# Patient Record
Sex: Male | Born: 1945
Health system: Southern US, Community
[De-identification: ages and names within clinical notes are randomized; demographics above are authoritative.]

## PROBLEM LIST (undated history)

## (undated) DIAGNOSIS — M1990 Unspecified osteoarthritis, unspecified site: Secondary | ICD-10-CM

## (undated) DIAGNOSIS — I2510 Atherosclerotic heart disease of native coronary artery without angina pectoris: Secondary | ICD-10-CM

## (undated) DIAGNOSIS — E119 Type 2 diabetes mellitus without complications: Secondary | ICD-10-CM

## (undated) DIAGNOSIS — I447 Left bundle-branch block, unspecified: Secondary | ICD-10-CM

## (undated) DIAGNOSIS — I10 Essential (primary) hypertension: Secondary | ICD-10-CM

## (undated) DIAGNOSIS — J45909 Unspecified asthma, uncomplicated: Secondary | ICD-10-CM

## (undated) DIAGNOSIS — I509 Heart failure, unspecified: Secondary | ICD-10-CM

## (undated) DIAGNOSIS — I4819 Other persistent atrial fibrillation: Secondary | ICD-10-CM

## (undated) DIAGNOSIS — Z95810 Presence of automatic (implantable) cardiac defibrillator: Secondary | ICD-10-CM

## (undated) DIAGNOSIS — I255 Ischemic cardiomyopathy: Secondary | ICD-10-CM

## (undated) HISTORY — DX: Ischemic cardiomyopathy: I25.5

## (undated) HISTORY — DX: Left bundle-branch block, unspecified: I44.7

---

## 2007-09-04 ENCOUNTER — Ambulatory Visit: Payer: Self-pay | Admitting: Cardiology

## 2013-06-10 DIAGNOSIS — I10 Essential (primary) hypertension: Secondary | ICD-10-CM | POA: Diagnosis not present

## 2013-06-19 DIAGNOSIS — I10 Essential (primary) hypertension: Secondary | ICD-10-CM | POA: Diagnosis not present

## 2013-06-19 DIAGNOSIS — E1065 Type 1 diabetes mellitus with hyperglycemia: Secondary | ICD-10-CM | POA: Diagnosis not present

## 2013-09-10 DIAGNOSIS — J209 Acute bronchitis, unspecified: Secondary | ICD-10-CM | POA: Diagnosis not present

## 2013-09-10 DIAGNOSIS — I10 Essential (primary) hypertension: Secondary | ICD-10-CM | POA: Diagnosis not present

## 2013-12-10 DIAGNOSIS — I10 Essential (primary) hypertension: Secondary | ICD-10-CM | POA: Diagnosis not present

## 2013-12-12 DIAGNOSIS — I10 Essential (primary) hypertension: Secondary | ICD-10-CM | POA: Diagnosis not present

## 2013-12-12 DIAGNOSIS — E1065 Type 1 diabetes mellitus with hyperglycemia: Secondary | ICD-10-CM | POA: Diagnosis not present

## 2013-12-12 DIAGNOSIS — IMO0002 Reserved for concepts with insufficient information to code with codable children: Secondary | ICD-10-CM | POA: Diagnosis not present

## 2013-12-12 DIAGNOSIS — N400 Enlarged prostate without lower urinary tract symptoms: Secondary | ICD-10-CM | POA: Diagnosis not present

## 2014-03-14 DIAGNOSIS — IMO0001 Reserved for inherently not codable concepts without codable children: Secondary | ICD-10-CM | POA: Diagnosis not present

## 2014-03-14 DIAGNOSIS — E1065 Type 1 diabetes mellitus with hyperglycemia: Secondary | ICD-10-CM | POA: Diagnosis not present

## 2014-03-14 DIAGNOSIS — IMO0002 Reserved for concepts with insufficient information to code with codable children: Secondary | ICD-10-CM | POA: Diagnosis not present

## 2014-03-14 DIAGNOSIS — I10 Essential (primary) hypertension: Secondary | ICD-10-CM | POA: Diagnosis not present

## 2014-04-10 DIAGNOSIS — E119 Type 2 diabetes mellitus without complications: Secondary | ICD-10-CM | POA: Diagnosis not present

## 2014-05-31 DIAGNOSIS — Z23 Encounter for immunization: Secondary | ICD-10-CM | POA: Diagnosis not present

## 2014-06-16 DIAGNOSIS — E1165 Type 2 diabetes mellitus with hyperglycemia: Secondary | ICD-10-CM | POA: Diagnosis not present

## 2014-06-16 DIAGNOSIS — I10 Essential (primary) hypertension: Secondary | ICD-10-CM | POA: Diagnosis not present

## 2014-06-20 DIAGNOSIS — R079 Chest pain, unspecified: Secondary | ICD-10-CM | POA: Diagnosis not present

## 2014-06-20 DIAGNOSIS — I447 Left bundle-branch block, unspecified: Secondary | ICD-10-CM | POA: Diagnosis not present

## 2014-07-10 DIAGNOSIS — R079 Chest pain, unspecified: Secondary | ICD-10-CM | POA: Diagnosis not present

## 2014-09-16 DIAGNOSIS — E1165 Type 2 diabetes mellitus with hyperglycemia: Secondary | ICD-10-CM | POA: Diagnosis not present

## 2014-09-16 DIAGNOSIS — I10 Essential (primary) hypertension: Secondary | ICD-10-CM | POA: Diagnosis not present

## 2014-09-24 DIAGNOSIS — I10 Essential (primary) hypertension: Secondary | ICD-10-CM | POA: Diagnosis not present

## 2014-09-24 DIAGNOSIS — E1165 Type 2 diabetes mellitus with hyperglycemia: Secondary | ICD-10-CM | POA: Diagnosis not present

## 2014-12-16 DIAGNOSIS — I10 Essential (primary) hypertension: Secondary | ICD-10-CM | POA: Diagnosis not present

## 2014-12-16 DIAGNOSIS — M1712 Unilateral primary osteoarthritis, left knee: Secondary | ICD-10-CM | POA: Diagnosis not present

## 2014-12-16 DIAGNOSIS — E1165 Type 2 diabetes mellitus with hyperglycemia: Secondary | ICD-10-CM | POA: Diagnosis not present

## 2015-03-17 DIAGNOSIS — M1712 Unilateral primary osteoarthritis, left knee: Secondary | ICD-10-CM | POA: Diagnosis not present

## 2015-03-17 DIAGNOSIS — I10 Essential (primary) hypertension: Secondary | ICD-10-CM | POA: Diagnosis not present

## 2015-03-17 DIAGNOSIS — C4441 Basal cell carcinoma of skin of scalp and neck: Secondary | ICD-10-CM | POA: Diagnosis not present

## 2015-03-17 DIAGNOSIS — E1165 Type 2 diabetes mellitus with hyperglycemia: Secondary | ICD-10-CM | POA: Diagnosis not present

## 2015-03-17 DIAGNOSIS — L570 Actinic keratosis: Secondary | ICD-10-CM | POA: Diagnosis not present

## 2015-03-18 DIAGNOSIS — I10 Essential (primary) hypertension: Secondary | ICD-10-CM | POA: Diagnosis not present

## 2015-03-18 DIAGNOSIS — E1165 Type 2 diabetes mellitus with hyperglycemia: Secondary | ICD-10-CM | POA: Diagnosis not present

## 2015-04-02 DIAGNOSIS — C4441 Basal cell carcinoma of skin of scalp and neck: Secondary | ICD-10-CM | POA: Diagnosis not present

## 2015-04-19 DIAGNOSIS — Z23 Encounter for immunization: Secondary | ICD-10-CM | POA: Diagnosis not present

## 2015-05-25 DIAGNOSIS — Z23 Encounter for immunization: Secondary | ICD-10-CM | POA: Diagnosis not present

## 2015-06-18 DIAGNOSIS — E1165 Type 2 diabetes mellitus with hyperglycemia: Secondary | ICD-10-CM | POA: Diagnosis not present

## 2015-06-18 DIAGNOSIS — I10 Essential (primary) hypertension: Secondary | ICD-10-CM | POA: Diagnosis not present

## 2015-09-10 DIAGNOSIS — E113293 Type 2 diabetes mellitus with mild nonproliferative diabetic retinopathy without macular edema, bilateral: Secondary | ICD-10-CM | POA: Diagnosis not present

## 2015-09-18 DIAGNOSIS — I10 Essential (primary) hypertension: Secondary | ICD-10-CM | POA: Diagnosis not present

## 2015-09-18 DIAGNOSIS — E1165 Type 2 diabetes mellitus with hyperglycemia: Secondary | ICD-10-CM | POA: Diagnosis not present

## 2015-09-18 DIAGNOSIS — Z0000 Encounter for general adult medical examination without abnormal findings: Secondary | ICD-10-CM | POA: Diagnosis not present

## 2015-09-23 DIAGNOSIS — I10 Essential (primary) hypertension: Secondary | ICD-10-CM | POA: Diagnosis not present

## 2015-09-23 DIAGNOSIS — E1165 Type 2 diabetes mellitus with hyperglycemia: Secondary | ICD-10-CM | POA: Diagnosis not present

## 2015-12-18 DIAGNOSIS — L723 Sebaceous cyst: Secondary | ICD-10-CM | POA: Diagnosis not present

## 2015-12-18 DIAGNOSIS — E784 Other hyperlipidemia: Secondary | ICD-10-CM | POA: Diagnosis not present

## 2015-12-18 DIAGNOSIS — I10 Essential (primary) hypertension: Secondary | ICD-10-CM | POA: Diagnosis not present

## 2015-12-18 DIAGNOSIS — E1165 Type 2 diabetes mellitus with hyperglycemia: Secondary | ICD-10-CM | POA: Diagnosis not present

## 2016-01-15 DIAGNOSIS — E784 Other hyperlipidemia: Secondary | ICD-10-CM | POA: Diagnosis not present

## 2016-01-15 DIAGNOSIS — I10 Essential (primary) hypertension: Secondary | ICD-10-CM | POA: Diagnosis not present

## 2016-01-15 DIAGNOSIS — E1165 Type 2 diabetes mellitus with hyperglycemia: Secondary | ICD-10-CM | POA: Diagnosis not present

## 2016-01-19 DIAGNOSIS — Z951 Presence of aortocoronary bypass graft: Secondary | ICD-10-CM | POA: Diagnosis not present

## 2016-01-19 DIAGNOSIS — I519 Heart disease, unspecified: Secondary | ICD-10-CM | POA: Diagnosis not present

## 2016-01-19 DIAGNOSIS — Z79899 Other long term (current) drug therapy: Secondary | ICD-10-CM | POA: Diagnosis not present

## 2016-01-19 DIAGNOSIS — E119 Type 2 diabetes mellitus without complications: Secondary | ICD-10-CM | POA: Diagnosis not present

## 2016-01-19 DIAGNOSIS — I10 Essential (primary) hypertension: Secondary | ICD-10-CM | POA: Diagnosis not present

## 2016-01-19 DIAGNOSIS — Z8249 Family history of ischemic heart disease and other diseases of the circulatory system: Secondary | ICD-10-CM | POA: Diagnosis not present

## 2016-01-19 DIAGNOSIS — Z7982 Long term (current) use of aspirin: Secondary | ICD-10-CM | POA: Diagnosis not present

## 2016-01-19 DIAGNOSIS — Z1211 Encounter for screening for malignant neoplasm of colon: Secondary | ICD-10-CM | POA: Diagnosis not present

## 2016-01-19 DIAGNOSIS — Z7984 Long term (current) use of oral hypoglycemic drugs: Secondary | ICD-10-CM | POA: Diagnosis not present

## 2016-01-19 DIAGNOSIS — Z809 Family history of malignant neoplasm, unspecified: Secondary | ICD-10-CM | POA: Diagnosis not present

## 2016-03-09 DIAGNOSIS — E119 Type 2 diabetes mellitus without complications: Secondary | ICD-10-CM | POA: Diagnosis not present

## 2016-03-18 DIAGNOSIS — E1165 Type 2 diabetes mellitus with hyperglycemia: Secondary | ICD-10-CM | POA: Diagnosis not present

## 2016-03-18 DIAGNOSIS — I10 Essential (primary) hypertension: Secondary | ICD-10-CM | POA: Diagnosis not present

## 2016-03-18 DIAGNOSIS — E784 Other hyperlipidemia: Secondary | ICD-10-CM | POA: Diagnosis not present

## 2016-03-23 DIAGNOSIS — E784 Other hyperlipidemia: Secondary | ICD-10-CM | POA: Diagnosis not present

## 2016-03-23 DIAGNOSIS — E1165 Type 2 diabetes mellitus with hyperglycemia: Secondary | ICD-10-CM | POA: Diagnosis not present

## 2016-03-23 DIAGNOSIS — Z125 Encounter for screening for malignant neoplasm of prostate: Secondary | ICD-10-CM | POA: Diagnosis not present

## 2016-03-23 DIAGNOSIS — I10 Essential (primary) hypertension: Secondary | ICD-10-CM | POA: Diagnosis not present

## 2016-04-06 DIAGNOSIS — Z23 Encounter for immunization: Secondary | ICD-10-CM | POA: Diagnosis not present

## 2016-05-24 DIAGNOSIS — Z23 Encounter for immunization: Secondary | ICD-10-CM | POA: Diagnosis not present

## 2016-07-01 DIAGNOSIS — E784 Other hyperlipidemia: Secondary | ICD-10-CM | POA: Diagnosis not present

## 2016-07-01 DIAGNOSIS — I10 Essential (primary) hypertension: Secondary | ICD-10-CM | POA: Diagnosis not present

## 2016-07-01 DIAGNOSIS — E1165 Type 2 diabetes mellitus with hyperglycemia: Secondary | ICD-10-CM | POA: Diagnosis not present

## 2016-10-06 DIAGNOSIS — Z1389 Encounter for screening for other disorder: Secondary | ICD-10-CM | POA: Diagnosis not present

## 2016-10-06 DIAGNOSIS — E1165 Type 2 diabetes mellitus with hyperglycemia: Secondary | ICD-10-CM | POA: Diagnosis not present

## 2016-10-06 DIAGNOSIS — Z0000 Encounter for general adult medical examination without abnormal findings: Secondary | ICD-10-CM | POA: Diagnosis not present

## 2016-10-06 DIAGNOSIS — E784 Other hyperlipidemia: Secondary | ICD-10-CM | POA: Diagnosis not present

## 2016-10-06 DIAGNOSIS — I10 Essential (primary) hypertension: Secondary | ICD-10-CM | POA: Diagnosis not present

## 2016-11-28 DIAGNOSIS — E11319 Type 2 diabetes mellitus with unspecified diabetic retinopathy without macular edema: Secondary | ICD-10-CM | POA: Diagnosis not present

## 2017-01-09 DIAGNOSIS — I10 Essential (primary) hypertension: Secondary | ICD-10-CM | POA: Diagnosis not present

## 2017-01-09 DIAGNOSIS — B358 Other dermatophytoses: Secondary | ICD-10-CM | POA: Diagnosis not present

## 2017-01-09 DIAGNOSIS — E784 Other hyperlipidemia: Secondary | ICD-10-CM | POA: Diagnosis not present

## 2017-01-09 DIAGNOSIS — E1165 Type 2 diabetes mellitus with hyperglycemia: Secondary | ICD-10-CM | POA: Diagnosis not present

## 2017-01-23 DIAGNOSIS — L2089 Other atopic dermatitis: Secondary | ICD-10-CM | POA: Diagnosis not present

## 2017-02-15 DIAGNOSIS — L258 Unspecified contact dermatitis due to other agents: Secondary | ICD-10-CM | POA: Diagnosis not present

## 2017-04-17 DIAGNOSIS — E784 Other hyperlipidemia: Secondary | ICD-10-CM | POA: Diagnosis not present

## 2017-04-17 DIAGNOSIS — E1165 Type 2 diabetes mellitus with hyperglycemia: Secondary | ICD-10-CM | POA: Diagnosis not present

## 2017-04-17 DIAGNOSIS — E119 Type 2 diabetes mellitus without complications: Secondary | ICD-10-CM | POA: Diagnosis not present

## 2017-04-17 DIAGNOSIS — I10 Essential (primary) hypertension: Secondary | ICD-10-CM | POA: Diagnosis not present

## 2017-04-24 DIAGNOSIS — Z23 Encounter for immunization: Secondary | ICD-10-CM | POA: Diagnosis not present

## 2017-05-31 DIAGNOSIS — L308 Other specified dermatitis: Secondary | ICD-10-CM | POA: Diagnosis not present

## 2017-07-17 DIAGNOSIS — E119 Type 2 diabetes mellitus without complications: Secondary | ICD-10-CM | POA: Diagnosis not present

## 2017-07-17 DIAGNOSIS — I10 Essential (primary) hypertension: Secondary | ICD-10-CM | POA: Diagnosis not present

## 2017-10-17 DIAGNOSIS — E7849 Other hyperlipidemia: Secondary | ICD-10-CM | POA: Diagnosis not present

## 2017-10-17 DIAGNOSIS — E119 Type 2 diabetes mellitus without complications: Secondary | ICD-10-CM | POA: Diagnosis not present

## 2017-10-17 DIAGNOSIS — I10 Essential (primary) hypertension: Secondary | ICD-10-CM | POA: Diagnosis not present

## 2017-10-17 DIAGNOSIS — M1712 Unilateral primary osteoarthritis, left knee: Secondary | ICD-10-CM | POA: Diagnosis not present

## 2018-01-23 DIAGNOSIS — E7849 Other hyperlipidemia: Secondary | ICD-10-CM | POA: Diagnosis not present

## 2018-01-23 DIAGNOSIS — Z0000 Encounter for general adult medical examination without abnormal findings: Secondary | ICD-10-CM | POA: Diagnosis not present

## 2018-01-23 DIAGNOSIS — E119 Type 2 diabetes mellitus without complications: Secondary | ICD-10-CM | POA: Diagnosis not present

## 2018-01-23 DIAGNOSIS — Z1389 Encounter for screening for other disorder: Secondary | ICD-10-CM | POA: Diagnosis not present

## 2018-01-23 DIAGNOSIS — M1712 Unilateral primary osteoarthritis, left knee: Secondary | ICD-10-CM | POA: Diagnosis not present

## 2018-01-23 DIAGNOSIS — I10 Essential (primary) hypertension: Secondary | ICD-10-CM | POA: Diagnosis not present

## 2018-01-30 DIAGNOSIS — M810 Age-related osteoporosis without current pathological fracture: Secondary | ICD-10-CM | POA: Diagnosis not present

## 2018-01-30 DIAGNOSIS — E119 Type 2 diabetes mellitus without complications: Secondary | ICD-10-CM | POA: Diagnosis not present

## 2018-02-19 DIAGNOSIS — Z136 Encounter for screening for cardiovascular disorders: Secondary | ICD-10-CM | POA: Diagnosis not present

## 2018-02-19 DIAGNOSIS — Z87891 Personal history of nicotine dependence: Secondary | ICD-10-CM | POA: Diagnosis not present

## 2018-04-21 DIAGNOSIS — Z23 Encounter for immunization: Secondary | ICD-10-CM | POA: Diagnosis not present

## 2018-05-07 DIAGNOSIS — E7849 Other hyperlipidemia: Secondary | ICD-10-CM | POA: Diagnosis not present

## 2018-05-07 DIAGNOSIS — E119 Type 2 diabetes mellitus without complications: Secondary | ICD-10-CM | POA: Diagnosis not present

## 2018-05-07 DIAGNOSIS — Z1389 Encounter for screening for other disorder: Secondary | ICD-10-CM | POA: Diagnosis not present

## 2018-05-07 DIAGNOSIS — M1712 Unilateral primary osteoarthritis, left knee: Secondary | ICD-10-CM | POA: Diagnosis not present

## 2018-05-07 DIAGNOSIS — I10 Essential (primary) hypertension: Secondary | ICD-10-CM | POA: Diagnosis not present

## 2018-05-07 DIAGNOSIS — Z0000 Encounter for general adult medical examination without abnormal findings: Secondary | ICD-10-CM | POA: Diagnosis not present

## 2018-08-13 DIAGNOSIS — M1712 Unilateral primary osteoarthritis, left knee: Secondary | ICD-10-CM | POA: Diagnosis not present

## 2018-08-13 DIAGNOSIS — I10 Essential (primary) hypertension: Secondary | ICD-10-CM | POA: Diagnosis not present

## 2018-08-13 DIAGNOSIS — E119 Type 2 diabetes mellitus without complications: Secondary | ICD-10-CM | POA: Diagnosis not present

## 2018-08-13 DIAGNOSIS — E7849 Other hyperlipidemia: Secondary | ICD-10-CM | POA: Diagnosis not present

## 2018-10-01 DIAGNOSIS — J0191 Acute recurrent sinusitis, unspecified: Secondary | ICD-10-CM | POA: Diagnosis not present

## 2018-10-08 DIAGNOSIS — Z87891 Personal history of nicotine dependence: Secondary | ICD-10-CM | POA: Diagnosis not present

## 2018-10-08 DIAGNOSIS — E119 Type 2 diabetes mellitus without complications: Secondary | ICD-10-CM | POA: Diagnosis present

## 2018-10-08 DIAGNOSIS — D649 Anemia, unspecified: Secondary | ICD-10-CM | POA: Diagnosis present

## 2018-10-08 DIAGNOSIS — I420 Dilated cardiomyopathy: Secondary | ICD-10-CM | POA: Diagnosis not present

## 2018-10-08 DIAGNOSIS — I480 Paroxysmal atrial fibrillation: Secondary | ICD-10-CM | POA: Diagnosis present

## 2018-10-08 DIAGNOSIS — I5041 Acute combined systolic (congestive) and diastolic (congestive) heart failure: Secondary | ICD-10-CM | POA: Diagnosis present

## 2018-10-08 DIAGNOSIS — Z7984 Long term (current) use of oral hypoglycemic drugs: Secondary | ICD-10-CM | POA: Diagnosis not present

## 2018-10-08 DIAGNOSIS — E871 Hypo-osmolality and hyponatremia: Secondary | ICD-10-CM | POA: Diagnosis not present

## 2018-10-08 DIAGNOSIS — R931 Abnormal findings on diagnostic imaging of heart and coronary circulation: Secondary | ICD-10-CM | POA: Diagnosis not present

## 2018-10-08 DIAGNOSIS — I447 Left bundle-branch block, unspecified: Secondary | ICD-10-CM | POA: Diagnosis not present

## 2018-10-08 DIAGNOSIS — I110 Hypertensive heart disease with heart failure: Secondary | ICD-10-CM | POA: Diagnosis present

## 2018-10-08 DIAGNOSIS — Z7982 Long term (current) use of aspirin: Secondary | ICD-10-CM | POA: Diagnosis not present

## 2018-10-08 DIAGNOSIS — Z951 Presence of aortocoronary bypass graft: Secondary | ICD-10-CM | POA: Diagnosis not present

## 2018-10-08 DIAGNOSIS — E785 Hyperlipidemia, unspecified: Secondary | ICD-10-CM | POA: Diagnosis present

## 2018-10-08 DIAGNOSIS — I350 Nonrheumatic aortic (valve) stenosis: Secondary | ICD-10-CM | POA: Diagnosis not present

## 2018-10-08 DIAGNOSIS — I340 Nonrheumatic mitral (valve) insufficiency: Secondary | ICD-10-CM | POA: Diagnosis not present

## 2018-10-08 DIAGNOSIS — J90 Pleural effusion, not elsewhere classified: Secondary | ICD-10-CM | POA: Diagnosis not present

## 2018-10-08 DIAGNOSIS — J441 Chronic obstructive pulmonary disease with (acute) exacerbation: Secondary | ICD-10-CM | POA: Diagnosis present

## 2018-10-08 DIAGNOSIS — I509 Heart failure, unspecified: Secondary | ICD-10-CM | POA: Diagnosis not present

## 2018-10-08 DIAGNOSIS — I2510 Atherosclerotic heart disease of native coronary artery without angina pectoris: Secondary | ICD-10-CM | POA: Diagnosis present

## 2018-10-08 DIAGNOSIS — Z79899 Other long term (current) drug therapy: Secondary | ICD-10-CM | POA: Diagnosis not present

## 2018-10-09 DIAGNOSIS — I340 Nonrheumatic mitral (valve) insufficiency: Secondary | ICD-10-CM | POA: Diagnosis not present

## 2018-10-09 DIAGNOSIS — I350 Nonrheumatic aortic (valve) stenosis: Secondary | ICD-10-CM | POA: Diagnosis not present

## 2018-10-09 DIAGNOSIS — R931 Abnormal findings on diagnostic imaging of heart and coronary circulation: Secondary | ICD-10-CM | POA: Diagnosis not present

## 2018-10-22 DIAGNOSIS — I5022 Chronic systolic (congestive) heart failure: Secondary | ICD-10-CM | POA: Diagnosis not present

## 2018-10-22 DIAGNOSIS — I4819 Other persistent atrial fibrillation: Secondary | ICD-10-CM | POA: Diagnosis not present

## 2018-10-23 DIAGNOSIS — I5022 Chronic systolic (congestive) heart failure: Secondary | ICD-10-CM | POA: Diagnosis not present

## 2018-10-23 DIAGNOSIS — I4819 Other persistent atrial fibrillation: Secondary | ICD-10-CM | POA: Diagnosis not present

## 2018-10-30 DIAGNOSIS — Z79899 Other long term (current) drug therapy: Secondary | ICD-10-CM | POA: Diagnosis not present

## 2018-10-30 DIAGNOSIS — I4819 Other persistent atrial fibrillation: Secondary | ICD-10-CM | POA: Diagnosis not present

## 2018-10-30 DIAGNOSIS — I5022 Chronic systolic (congestive) heart failure: Secondary | ICD-10-CM | POA: Diagnosis not present

## 2018-11-07 DIAGNOSIS — I5033 Acute on chronic diastolic (congestive) heart failure: Secondary | ICD-10-CM | POA: Diagnosis not present

## 2018-11-07 DIAGNOSIS — R0602 Shortness of breath: Secondary | ICD-10-CM | POA: Diagnosis not present

## 2018-11-07 DIAGNOSIS — I447 Left bundle-branch block, unspecified: Secondary | ICD-10-CM | POA: Diagnosis not present

## 2018-11-07 DIAGNOSIS — I509 Heart failure, unspecified: Secondary | ICD-10-CM | POA: Diagnosis not present

## 2018-11-07 DIAGNOSIS — I272 Pulmonary hypertension, unspecified: Secondary | ICD-10-CM | POA: Diagnosis not present

## 2018-11-07 DIAGNOSIS — I2510 Atherosclerotic heart disease of native coronary artery without angina pectoris: Secondary | ICD-10-CM | POA: Diagnosis not present

## 2018-11-07 DIAGNOSIS — I4819 Other persistent atrial fibrillation: Secondary | ICD-10-CM | POA: Diagnosis not present

## 2018-11-07 DIAGNOSIS — I350 Nonrheumatic aortic (valve) stenosis: Secondary | ICD-10-CM | POA: Diagnosis not present

## 2018-11-09 DIAGNOSIS — Z7901 Long term (current) use of anticoagulants: Secondary | ICD-10-CM | POA: Diagnosis not present

## 2018-11-09 DIAGNOSIS — I350 Nonrheumatic aortic (valve) stenosis: Secondary | ICD-10-CM | POA: Diagnosis not present

## 2018-11-09 DIAGNOSIS — R945 Abnormal results of liver function studies: Secondary | ICD-10-CM | POA: Diagnosis not present

## 2018-11-09 DIAGNOSIS — D649 Anemia, unspecified: Secondary | ICD-10-CM | POA: Diagnosis present

## 2018-11-09 DIAGNOSIS — E7800 Pure hypercholesterolemia, unspecified: Secondary | ICD-10-CM | POA: Diagnosis present

## 2018-11-09 DIAGNOSIS — E8770 Fluid overload, unspecified: Secondary | ICD-10-CM | POA: Diagnosis not present

## 2018-11-09 DIAGNOSIS — J90 Pleural effusion, not elsewhere classified: Secondary | ICD-10-CM | POA: Diagnosis not present

## 2018-11-09 DIAGNOSIS — I5043 Acute on chronic combined systolic (congestive) and diastolic (congestive) heart failure: Secondary | ICD-10-CM | POA: Diagnosis present

## 2018-11-09 DIAGNOSIS — N189 Chronic kidney disease, unspecified: Secondary | ICD-10-CM | POA: Diagnosis not present

## 2018-11-09 DIAGNOSIS — I5023 Acute on chronic systolic (congestive) heart failure: Secondary | ICD-10-CM | POA: Diagnosis not present

## 2018-11-09 DIAGNOSIS — R0602 Shortness of breath: Secondary | ICD-10-CM | POA: Diagnosis not present

## 2018-11-09 DIAGNOSIS — Z7982 Long term (current) use of aspirin: Secondary | ICD-10-CM | POA: Diagnosis not present

## 2018-11-09 DIAGNOSIS — I509 Heart failure, unspecified: Secondary | ICD-10-CM | POA: Diagnosis not present

## 2018-11-09 DIAGNOSIS — Z87891 Personal history of nicotine dependence: Secondary | ICD-10-CM | POA: Diagnosis not present

## 2018-11-09 DIAGNOSIS — N179 Acute kidney failure, unspecified: Secondary | ICD-10-CM | POA: Diagnosis not present

## 2018-11-09 DIAGNOSIS — Z7984 Long term (current) use of oral hypoglycemic drugs: Secondary | ICD-10-CM | POA: Diagnosis not present

## 2018-11-09 DIAGNOSIS — E871 Hypo-osmolality and hyponatremia: Secondary | ICD-10-CM | POA: Diagnosis not present

## 2018-11-09 DIAGNOSIS — J449 Chronic obstructive pulmonary disease, unspecified: Secondary | ICD-10-CM | POA: Diagnosis present

## 2018-11-09 DIAGNOSIS — R05 Cough: Secondary | ICD-10-CM | POA: Diagnosis not present

## 2018-11-09 DIAGNOSIS — B372 Candidiasis of skin and nail: Secondary | ICD-10-CM | POA: Diagnosis present

## 2018-11-09 DIAGNOSIS — I447 Left bundle-branch block, unspecified: Secondary | ICD-10-CM | POA: Diagnosis not present

## 2018-11-09 DIAGNOSIS — I130 Hypertensive heart and chronic kidney disease with heart failure and stage 1 through stage 4 chronic kidney disease, or unspecified chronic kidney disease: Secondary | ICD-10-CM | POA: Diagnosis not present

## 2018-11-09 DIAGNOSIS — I4819 Other persistent atrial fibrillation: Secondary | ICD-10-CM | POA: Diagnosis present

## 2018-11-09 DIAGNOSIS — E1122 Type 2 diabetes mellitus with diabetic chronic kidney disease: Secondary | ICD-10-CM | POA: Diagnosis present

## 2018-11-09 DIAGNOSIS — R188 Other ascites: Secondary | ICD-10-CM | POA: Diagnosis present

## 2018-11-09 DIAGNOSIS — K761 Chronic passive congestion of liver: Secondary | ICD-10-CM | POA: Diagnosis not present

## 2018-11-09 DIAGNOSIS — I272 Pulmonary hypertension, unspecified: Secondary | ICD-10-CM | POA: Diagnosis present

## 2018-11-09 DIAGNOSIS — I2510 Atherosclerotic heart disease of native coronary artery without angina pectoris: Secondary | ICD-10-CM | POA: Diagnosis not present

## 2018-11-09 DIAGNOSIS — Z951 Presence of aortocoronary bypass graft: Secondary | ICD-10-CM | POA: Diagnosis not present

## 2018-11-15 DIAGNOSIS — J9811 Atelectasis: Secondary | ICD-10-CM | POA: Diagnosis not present

## 2018-11-15 DIAGNOSIS — I4819 Other persistent atrial fibrillation: Secondary | ICD-10-CM | POA: Diagnosis present

## 2018-11-15 DIAGNOSIS — I5040 Unspecified combined systolic (congestive) and diastolic (congestive) heart failure: Secondary | ICD-10-CM | POA: Diagnosis not present

## 2018-11-15 DIAGNOSIS — I081 Rheumatic disorders of both mitral and tricuspid valves: Secondary | ICD-10-CM | POA: Diagnosis present

## 2018-11-15 DIAGNOSIS — I361 Nonrheumatic tricuspid (valve) insufficiency: Secondary | ICD-10-CM | POA: Diagnosis not present

## 2018-11-15 DIAGNOSIS — D696 Thrombocytopenia, unspecified: Secondary | ICD-10-CM | POA: Diagnosis not present

## 2018-11-15 DIAGNOSIS — R932 Abnormal findings on diagnostic imaging of liver and biliary tract: Secondary | ICD-10-CM | POA: Diagnosis not present

## 2018-11-15 DIAGNOSIS — I272 Pulmonary hypertension, unspecified: Secondary | ICD-10-CM | POA: Diagnosis not present

## 2018-11-15 DIAGNOSIS — K7200 Acute and subacute hepatic failure without coma: Secondary | ICD-10-CM | POA: Diagnosis not present

## 2018-11-15 DIAGNOSIS — I340 Nonrheumatic mitral (valve) insufficiency: Secondary | ICD-10-CM | POA: Diagnosis not present

## 2018-11-15 DIAGNOSIS — I129 Hypertensive chronic kidney disease with stage 1 through stage 4 chronic kidney disease, or unspecified chronic kidney disease: Secondary | ICD-10-CM | POA: Diagnosis present

## 2018-11-15 DIAGNOSIS — I130 Hypertensive heart and chronic kidney disease with heart failure and stage 1 through stage 4 chronic kidney disease, or unspecified chronic kidney disease: Secondary | ICD-10-CM | POA: Diagnosis not present

## 2018-11-15 DIAGNOSIS — R918 Other nonspecific abnormal finding of lung field: Secondary | ICD-10-CM | POA: Diagnosis not present

## 2018-11-15 DIAGNOSIS — J811 Chronic pulmonary edema: Secondary | ICD-10-CM | POA: Diagnosis not present

## 2018-11-15 DIAGNOSIS — E876 Hypokalemia: Secondary | ICD-10-CM | POA: Diagnosis present

## 2018-11-15 DIAGNOSIS — I447 Left bundle-branch block, unspecified: Secondary | ICD-10-CM | POA: Diagnosis present

## 2018-11-15 DIAGNOSIS — I517 Cardiomegaly: Secondary | ICD-10-CM | POA: Diagnosis not present

## 2018-11-15 DIAGNOSIS — J45909 Unspecified asthma, uncomplicated: Secondary | ICD-10-CM | POA: Diagnosis present

## 2018-11-15 DIAGNOSIS — M6250 Muscle wasting and atrophy, not elsewhere classified, unspecified site: Secondary | ICD-10-CM | POA: Diagnosis not present

## 2018-11-15 DIAGNOSIS — R140 Abdominal distension (gaseous): Secondary | ICD-10-CM | POA: Diagnosis not present

## 2018-11-15 DIAGNOSIS — R0602 Shortness of breath: Secondary | ICD-10-CM | POA: Diagnosis not present

## 2018-11-15 DIAGNOSIS — R0989 Other specified symptoms and signs involving the circulatory and respiratory systems: Secondary | ICD-10-CM | POA: Diagnosis not present

## 2018-11-15 DIAGNOSIS — R188 Other ascites: Secondary | ICD-10-CM | POA: Diagnosis present

## 2018-11-15 DIAGNOSIS — E1022 Type 1 diabetes mellitus with diabetic chronic kidney disease: Secondary | ICD-10-CM | POA: Diagnosis not present

## 2018-11-15 DIAGNOSIS — K761 Chronic passive congestion of liver: Secondary | ICD-10-CM | POA: Diagnosis present

## 2018-11-15 DIAGNOSIS — I4891 Unspecified atrial fibrillation: Secondary | ICD-10-CM | POA: Diagnosis not present

## 2018-11-15 DIAGNOSIS — I5020 Unspecified systolic (congestive) heart failure: Secondary | ICD-10-CM | POA: Diagnosis not present

## 2018-11-15 DIAGNOSIS — Z452 Encounter for adjustment and management of vascular access device: Secondary | ICD-10-CM | POA: Diagnosis not present

## 2018-11-15 DIAGNOSIS — I509 Heart failure, unspecified: Secondary | ICD-10-CM | POA: Diagnosis not present

## 2018-11-15 DIAGNOSIS — R600 Localized edema: Secondary | ICD-10-CM | POA: Diagnosis not present

## 2018-11-15 DIAGNOSIS — I5021 Acute systolic (congestive) heart failure: Secondary | ICD-10-CM | POA: Diagnosis not present

## 2018-11-15 DIAGNOSIS — I358 Other nonrheumatic aortic valve disorders: Secondary | ICD-10-CM | POA: Diagnosis not present

## 2018-11-15 DIAGNOSIS — N179 Acute kidney failure, unspecified: Secondary | ICD-10-CM | POA: Diagnosis not present

## 2018-11-15 DIAGNOSIS — E878 Other disorders of electrolyte and fluid balance, not elsewhere classified: Secondary | ICD-10-CM | POA: Diagnosis present

## 2018-11-15 DIAGNOSIS — R633 Feeding difficulties: Secondary | ICD-10-CM | POA: Diagnosis not present

## 2018-11-15 DIAGNOSIS — I2722 Pulmonary hypertension due to left heart disease: Secondary | ICD-10-CM | POA: Diagnosis present

## 2018-11-15 DIAGNOSIS — J90 Pleural effusion, not elsewhere classified: Secondary | ICD-10-CM | POA: Diagnosis not present

## 2018-11-15 DIAGNOSIS — E8770 Fluid overload, unspecified: Secondary | ICD-10-CM | POA: Diagnosis not present

## 2018-11-15 DIAGNOSIS — I5033 Acute on chronic diastolic (congestive) heart failure: Secondary | ICD-10-CM | POA: Diagnosis not present

## 2018-11-15 DIAGNOSIS — N2889 Other specified disorders of kidney and ureter: Secondary | ICD-10-CM | POA: Diagnosis present

## 2018-11-15 DIAGNOSIS — N170 Acute kidney failure with tubular necrosis: Secondary | ICD-10-CM | POA: Diagnosis not present

## 2018-11-15 DIAGNOSIS — R945 Abnormal results of liver function studies: Secondary | ICD-10-CM | POA: Diagnosis not present

## 2018-11-15 DIAGNOSIS — I2510 Atherosclerotic heart disease of native coronary artery without angina pectoris: Secondary | ICD-10-CM | POA: Diagnosis present

## 2018-11-15 DIAGNOSIS — Z743 Need for continuous supervision: Secondary | ICD-10-CM | POA: Diagnosis not present

## 2018-11-15 DIAGNOSIS — R109 Unspecified abdominal pain: Secondary | ICD-10-CM | POA: Diagnosis not present

## 2018-11-15 DIAGNOSIS — E11649 Type 2 diabetes mellitus with hypoglycemia without coma: Secondary | ICD-10-CM | POA: Diagnosis not present

## 2018-11-15 DIAGNOSIS — N189 Chronic kidney disease, unspecified: Secondary | ICD-10-CM | POA: Diagnosis present

## 2018-11-15 DIAGNOSIS — B356 Tinea cruris: Secondary | ICD-10-CM | POA: Diagnosis present

## 2018-11-15 DIAGNOSIS — Z794 Long term (current) use of insulin: Secondary | ICD-10-CM | POA: Diagnosis not present

## 2018-11-15 DIAGNOSIS — E875 Hyperkalemia: Secondary | ICD-10-CM | POA: Diagnosis not present

## 2018-11-15 DIAGNOSIS — E871 Hypo-osmolality and hyponatremia: Secondary | ICD-10-CM | POA: Diagnosis present

## 2018-11-15 DIAGNOSIS — I5043 Acute on chronic combined systolic (congestive) and diastolic (congestive) heart failure: Secondary | ICD-10-CM | POA: Diagnosis present

## 2018-11-15 DIAGNOSIS — I255 Ischemic cardiomyopathy: Secondary | ICD-10-CM | POA: Diagnosis present

## 2018-11-15 DIAGNOSIS — E1165 Type 2 diabetes mellitus with hyperglycemia: Secondary | ICD-10-CM | POA: Diagnosis not present

## 2018-11-15 DIAGNOSIS — R570 Cardiogenic shock: Secondary | ICD-10-CM | POA: Diagnosis not present

## 2018-11-15 DIAGNOSIS — E1122 Type 2 diabetes mellitus with diabetic chronic kidney disease: Secondary | ICD-10-CM | POA: Diagnosis present

## 2018-12-10 DIAGNOSIS — I340 Nonrheumatic mitral (valve) insufficiency: Secondary | ICD-10-CM | POA: Diagnosis not present

## 2018-12-10 DIAGNOSIS — I361 Nonrheumatic tricuspid (valve) insufficiency: Secondary | ICD-10-CM | POA: Diagnosis not present

## 2018-12-10 DIAGNOSIS — I272 Pulmonary hypertension, unspecified: Secondary | ICD-10-CM | POA: Diagnosis not present

## 2018-12-10 DIAGNOSIS — I5082 Biventricular heart failure: Secondary | ICD-10-CM | POA: Diagnosis not present

## 2018-12-11 DIAGNOSIS — I10 Essential (primary) hypertension: Secondary | ICD-10-CM | POA: Diagnosis not present

## 2018-12-11 DIAGNOSIS — I5022 Chronic systolic (congestive) heart failure: Secondary | ICD-10-CM | POA: Diagnosis not present

## 2018-12-11 DIAGNOSIS — E1165 Type 2 diabetes mellitus with hyperglycemia: Secondary | ICD-10-CM | POA: Diagnosis not present

## 2019-01-07 DIAGNOSIS — D225 Melanocytic nevi of trunk: Secondary | ICD-10-CM | POA: Diagnosis not present

## 2019-01-07 DIAGNOSIS — L308 Other specified dermatitis: Secondary | ICD-10-CM | POA: Diagnosis not present

## 2019-01-21 DIAGNOSIS — I5082 Biventricular heart failure: Secondary | ICD-10-CM | POA: Diagnosis not present

## 2019-01-21 DIAGNOSIS — I272 Pulmonary hypertension, unspecified: Secondary | ICD-10-CM | POA: Diagnosis not present

## 2019-01-21 DIAGNOSIS — I420 Dilated cardiomyopathy: Secondary | ICD-10-CM | POA: Diagnosis not present

## 2019-01-21 DIAGNOSIS — I4819 Other persistent atrial fibrillation: Secondary | ICD-10-CM | POA: Diagnosis not present

## 2019-01-21 DIAGNOSIS — I340 Nonrheumatic mitral (valve) insufficiency: Secondary | ICD-10-CM | POA: Diagnosis not present

## 2019-01-21 DIAGNOSIS — I361 Nonrheumatic tricuspid (valve) insufficiency: Secondary | ICD-10-CM | POA: Diagnosis not present

## 2019-02-05 DIAGNOSIS — I420 Dilated cardiomyopathy: Secondary | ICD-10-CM | POA: Diagnosis not present

## 2019-02-05 DIAGNOSIS — I517 Cardiomegaly: Secondary | ICD-10-CM | POA: Diagnosis not present

## 2019-02-26 ENCOUNTER — Encounter: Payer: Self-pay | Admitting: *Deleted

## 2019-02-26 ENCOUNTER — Telehealth: Payer: Self-pay | Admitting: *Deleted

## 2019-02-26 DIAGNOSIS — I420 Dilated cardiomyopathy: Secondary | ICD-10-CM

## 2019-02-26 DIAGNOSIS — I4819 Other persistent atrial fibrillation: Secondary | ICD-10-CM

## 2019-02-26 NOTE — Telephone Encounter (Signed)
Pt contacted pre-catheterization scheduled at [facility] for: Thursday March 07, 2019 7:30 AM Verified arrival time and place: [facility] Main Entrance A at: 5:30 AM Dr End will do H&P at hospital morning of procedure.  Covid-19 test date: 03/04/19 [facility] BMP/CBC/PT/INR-[place] lab 03/04/19  No solid food after midnight prior to cath, clear liquids until 5 AM day of procedure. Contrast allergy:  no  Hold: Coumadin-none 03/02/19 until post procedure. Metformin-day of procedure and 48 hours post procedure Torsemide-AM of procedure. Spironolactone-AM of procedure.  Except hold medications AM meds can be  taken pre-cath with sip of water including: ASA 81 mg   Confirmed patient has responsible person to drive home post procedure and observe 24 hours after arriving home: yes  Due to Covid-19 pandemic, only one support person will be allowed with patient. Must be the same support person for that patient's entire stay, will be screened and required to wear a mask.   Patients are required to wear a mask when they enter the hospital.  I reviewed procedure/mask/visitor instructions with patient, he verbalized understanding.

## 2019-02-26 NOTE — Telephone Encounter (Signed)
Per Dr Avelina Laine by Dr. Olevia Bowens at [facility] about arranging a right and left heart cath (with bypass) for Brian Bruce.  Pt has been scheduled for R/LHC/grafts Thursday August 6, 7:30 AM, arrive 5:30 AM. Dr End will do H&P morning of cath. Per Dr Laurence Spates think it is fine to hold anticoagulation for 5 days before cath without bridging him.

## 2019-03-01 ENCOUNTER — Other Ambulatory Visit: Payer: Self-pay

## 2019-03-04 ENCOUNTER — Other Ambulatory Visit (HOSPITAL_COMMUNITY)
Admission: RE | Admit: 2019-03-04 | Discharge: 2019-03-04 | Disposition: A | Discharge status: Home / Self Care | Payer: Medicare Other | Source: Ambulatory Visit | Attending: Internal Medicine | Admitting: Internal Medicine

## 2019-03-04 DIAGNOSIS — Z20828 Contact with and (suspected) exposure to other viral communicable diseases: Secondary | ICD-10-CM | POA: Insufficient documentation

## 2019-03-04 DIAGNOSIS — I4819 Other persistent atrial fibrillation: Secondary | ICD-10-CM | POA: Insufficient documentation

## 2019-03-04 DIAGNOSIS — I420 Dilated cardiomyopathy: Secondary | ICD-10-CM | POA: Diagnosis not present

## 2019-03-04 LAB — BASIC METABOLIC PANEL
Anion gap: 9 (ref 5–15)
BUN: 19 mg/dL (ref 8–23)
CO2: 25 mmol/L (ref 22–32)
Calcium: 9.5 mg/dL (ref 8.9–10.3)
Chloride: 95 mmol/L — ABNORMAL LOW (ref 98–111)
Creatinine, Ser: 0.81 mg/dL (ref 0.61–1.24)
GFR calc Af Amer: >60 mL/min (ref >60)
GFR calc non Af Amer: >60 mL/min (ref >60)
Glucose, Bld: 132 mg/dL — ABNORMAL HIGH (ref 70–99)
Potassium: 5.2 mmol/L — ABNORMAL HIGH (ref 3.5–5.1)
Sodium: 129 mmol/L — ABNORMAL LOW (ref 135–145)

## 2019-03-04 LAB — SARS CORONAVIRUS 2 (TAT 6-24 HRS): SARS Coronavirus 2: NEGATIVE

## 2019-03-04 LAB — PROTIME-INR
INR: 1.1 (ref 0.8–1.2)
Prothrombin Time: 13.7 seconds (ref 11.4–15.2)

## 2019-03-04 LAB — CBC
HCT: 34.8 % — ABNORMAL LOW (ref 39.0–52.0)
Hemoglobin: 11.6 g/dL — ABNORMAL LOW (ref 13.0–17.0)
MCH: 30.3 pg (ref 26.0–34.0)
MCHC: 33.3 g/dL (ref 30.0–36.0)
MCV: 90.9 fL (ref 80.0–100.0)
Platelets: 249 10*3/uL (ref 150–400)
RBC: 3.83 MIL/uL — ABNORMAL LOW (ref 4.22–5.81)
RDW: 15.3 % (ref 11.5–15.5)
WBC: 6.2 10*3/uL (ref 4.0–10.5)
nRBC: 0 % (ref 0.0–0.2)

## 2019-03-05 NOTE — Telephone Encounter (Signed)
Copied from BMP results 03/04/19: Notes recorded by End, Harrell Gave, MD on 03/05/2019 at 6:40 AM EDT  Mild anemia, hyponatremia, and hypokalemia noted. I recommend decreasing spironolactone to 25 mg daily. Okay to proceed with catheterization as planned this week.  Pt advised to decrease spironolactone to 25 mg daily (1/2 of 50 mg tablet), verbalized understanding.       COVID-19 Pre-Screening Questions:  . In the past 7 to 10 days have you had a cough,  shortness of breath, headache, congestion, fever (100 or greater) body aches, chills, sore throat, or sudden loss of taste or sense of smell? headache . Have you been around anyone with known Covid 19? no . Have you been around anyone who is awaiting Covid 19 test results in the past 7 to 10 days? no . Have you been around anyone who has been exposed to Covid 19, or has mentioned symptoms of Covid 19 within the past 7 to 10 days? no   I reviewed procedure/mask/visitor, Covid-19 screening questions with patient, he verbalized understanding, thanked me for call.

## 2019-03-07 ENCOUNTER — Encounter (HOSPITAL_COMMUNITY): Payer: Self-pay | Admitting: Internal Medicine

## 2019-03-07 ENCOUNTER — Encounter (HOSPITAL_COMMUNITY)
Admission: RE | Disposition: A | Discharge status: Home / Self Care | Payer: Medicare Other | Source: Home / Self Care | Attending: Internal Medicine

## 2019-03-07 ENCOUNTER — Other Ambulatory Visit: Payer: Self-pay

## 2019-03-07 ENCOUNTER — Ambulatory Visit (HOSPITAL_COMMUNITY)
Admission: RE | Admit: 2019-03-07 | Discharge: 2019-03-07 | Disposition: A | Discharge status: Home / Self Care | Payer: Medicare Other | Attending: Internal Medicine | Admitting: Internal Medicine

## 2019-03-07 DIAGNOSIS — J45909 Unspecified asthma, uncomplicated: Secondary | ICD-10-CM | POA: Insufficient documentation

## 2019-03-07 DIAGNOSIS — I5042 Chronic combined systolic (congestive) and diastolic (congestive) heart failure: Secondary | ICD-10-CM | POA: Insufficient documentation

## 2019-03-07 DIAGNOSIS — I2584 Coronary atherosclerosis due to calcified coronary lesion: Secondary | ICD-10-CM | POA: Diagnosis not present

## 2019-03-07 DIAGNOSIS — Z7982 Long term (current) use of aspirin: Secondary | ICD-10-CM | POA: Insufficient documentation

## 2019-03-07 DIAGNOSIS — I4819 Other persistent atrial fibrillation: Secondary | ICD-10-CM | POA: Insufficient documentation

## 2019-03-07 DIAGNOSIS — Z8249 Family history of ischemic heart disease and other diseases of the circulatory system: Secondary | ICD-10-CM | POA: Insufficient documentation

## 2019-03-07 DIAGNOSIS — Z7984 Long term (current) use of oral hypoglycemic drugs: Secondary | ICD-10-CM | POA: Insufficient documentation

## 2019-03-07 DIAGNOSIS — Z79899 Other long term (current) drug therapy: Secondary | ICD-10-CM | POA: Diagnosis not present

## 2019-03-07 DIAGNOSIS — I25810 Atherosclerosis of coronary artery bypass graft(s) without angina pectoris: Secondary | ICD-10-CM | POA: Diagnosis not present

## 2019-03-07 DIAGNOSIS — Z7901 Long term (current) use of anticoagulants: Secondary | ICD-10-CM | POA: Insufficient documentation

## 2019-03-07 DIAGNOSIS — Z87891 Personal history of nicotine dependence: Secondary | ICD-10-CM | POA: Insufficient documentation

## 2019-03-07 DIAGNOSIS — I2582 Chronic total occlusion of coronary artery: Secondary | ICD-10-CM | POA: Diagnosis not present

## 2019-03-07 DIAGNOSIS — I2510 Atherosclerotic heart disease of native coronary artery without angina pectoris: Secondary | ICD-10-CM | POA: Diagnosis present

## 2019-03-07 DIAGNOSIS — E119 Type 2 diabetes mellitus without complications: Secondary | ICD-10-CM | POA: Insufficient documentation

## 2019-03-07 DIAGNOSIS — I5022 Chronic systolic (congestive) heart failure: Secondary | ICD-10-CM | POA: Diagnosis present

## 2019-03-07 DIAGNOSIS — I428 Other cardiomyopathies: Secondary | ICD-10-CM | POA: Insufficient documentation

## 2019-03-07 HISTORY — DX: Heart failure, unspecified: I50.9

## 2019-03-07 HISTORY — DX: Type 2 diabetes mellitus without complications: E11.9

## 2019-03-07 HISTORY — DX: Other persistent atrial fibrillation: I48.19

## 2019-03-07 HISTORY — PX: RIGHT/LEFT HEART CATH AND CORONARY/GRAFT ANGIOGRAPHY: CATH118267

## 2019-03-07 HISTORY — DX: Atherosclerotic heart disease of native coronary artery without angina pectoris: I25.10

## 2019-03-07 HISTORY — DX: Unspecified asthma, uncomplicated: J45.909

## 2019-03-07 LAB — POCT I-STAT EG7
Acid-base deficit: 1 mmol/L (ref 0.0–2.0)
Bicarbonate: 25 mmol/L (ref 20.0–28.0)
Calcium, Ion: 1.27 mmol/L (ref 1.15–1.40)
HCT: 35 % — ABNORMAL LOW (ref 39.0–52.0)
Hemoglobin: 11.9 g/dL — ABNORMAL LOW (ref 13.0–17.0)
O2 Saturation: 73 %
Potassium: 4.7 mmol/L (ref 3.5–5.1)
Sodium: 135 mmol/L (ref 135–145)
TCO2: 26 mmol/L (ref 22–32)
pCO2, Ven: 46 mmHg (ref 44.0–60.0)
pH, Ven: 7.343 (ref 7.250–7.430)
pO2, Ven: 41 mmHg (ref 32.0–45.0)

## 2019-03-07 LAB — POCT I-STAT 7, (LYTES, BLD GAS, ICA,H+H)
Acid-base deficit: 1 mmol/L (ref 0.0–2.0)
Bicarbonate: 24.6 mmol/L (ref 20.0–28.0)
Calcium, Ion: 1.23 mmol/L (ref 1.15–1.40)
HCT: 34 % — ABNORMAL LOW (ref 39.0–52.0)
Hemoglobin: 11.6 g/dL — ABNORMAL LOW (ref 13.0–17.0)
O2 Saturation: 99 %
Potassium: 4.6 mmol/L (ref 3.5–5.1)
Sodium: 135 mmol/L (ref 135–145)
TCO2: 26 mmol/L (ref 22–32)
pCO2 arterial: 43.7 mmHg (ref 32.0–48.0)
pH, Arterial: 7.359 (ref 7.350–7.450)
pO2, Arterial: 150 mmHg — ABNORMAL HIGH (ref 83.0–108.0)

## 2019-03-07 LAB — POCT ACTIVATED CLOTTING TIME: Activated Clotting Time: 169 seconds

## 2019-03-07 LAB — GLUCOSE, CAPILLARY: Glucose-Capillary: 110 mg/dL — ABNORMAL HIGH (ref 70–99)

## 2019-03-07 LAB — PROTIME-INR
INR: 1 (ref 0.8–1.2)
Prothrombin Time: 13.3 seconds (ref 11.4–15.2)

## 2019-03-07 SURGERY — RIGHT/LEFT HEART CATH AND CORONARY/GRAFT ANGIOGRAPHY
Anesthesia: LOCAL

## 2019-03-07 MED ORDER — VERAPAMIL HCL 2.5 MG/ML IV SOLN
INTRAVENOUS | Status: DC | PRN
  Administered 2019-03-07: 10 mL via INTRA_ARTERIAL

## 2019-03-07 MED ORDER — FENTANYL CITRATE (PF) 100 MCG/2ML IJ SOLN
INTRAMUSCULAR | Status: AC
  Filled 2019-03-07: qty 2

## 2019-03-07 MED ORDER — IOHEXOL 350 MG/ML SOLN
INTRAVENOUS | Status: DC | PRN
  Administered 2019-03-07: 130 mL via INTRACARDIAC

## 2019-03-07 MED ORDER — LABETALOL HCL 5 MG/ML IV SOLN: 10.0000 mg | INTRAVENOUS | Status: DC | PRN

## 2019-03-07 MED ORDER — HEPARIN (PORCINE) IN NACL 1000-0.9 UT/500ML-% IV SOLN
INTRAVENOUS | Status: DC | PRN
  Administered 2019-03-07 (×2): 500 mL

## 2019-03-07 MED ORDER — FENTANYL CITRATE (PF) 100 MCG/2ML IJ SOLN
INTRAMUSCULAR | Status: DC | PRN
  Administered 2019-03-07: 25 ug via INTRAVENOUS

## 2019-03-07 MED ORDER — SODIUM CHLORIDE 0.9% FLUSH: 3.0000 mL | INTRAVENOUS | Status: DC | PRN

## 2019-03-07 MED ORDER — SODIUM CHLORIDE 0.9 % IV SOLN: 250.0000 mL | INTRAVENOUS | Status: DC | PRN

## 2019-03-07 MED ORDER — HEPARIN SODIUM (PORCINE) 1000 UNIT/ML IJ SOLN
INTRAMUSCULAR | Status: DC | PRN
  Administered 2019-03-07: 4000 [IU] via INTRAVENOUS

## 2019-03-07 MED ORDER — HEPARIN (PORCINE) IN NACL 1000-0.9 UT/500ML-% IV SOLN
INTRAVENOUS | Status: AC
  Filled 2019-03-07: qty 1000

## 2019-03-07 MED ORDER — TORSEMIDE 20 MG PO TABS: 20.0000 mg | ORAL_TABLET | Freq: Every day | ORAL | Status: DC

## 2019-03-07 MED ORDER — ACETAMINOPHEN 325 MG PO TABS: 650.0000 mg | ORAL_TABLET | ORAL | Status: DC | PRN

## 2019-03-07 MED ORDER — SODIUM CHLORIDE 0.9% FLUSH: 3.0000 mL | Freq: Two times a day (BID) | INTRAVENOUS | Status: DC

## 2019-03-07 MED ORDER — SODIUM CHLORIDE 0.9 % IV SOLN
INTRAVENOUS | Status: DC
  Administered 2019-03-07: 06:00:00 via INTRAVENOUS

## 2019-03-07 MED ORDER — LIDOCAINE HCL (PF) 1 % IJ SOLN
INTRAMUSCULAR | Status: DC | PRN
  Administered 2019-03-07: 15 mL via INTRADERMAL
  Administered 2019-03-07 (×2): 2 mL via INTRADERMAL

## 2019-03-07 MED ORDER — SODIUM CHLORIDE 0.9 % IV SOLN: INTRAVENOUS | Status: AC

## 2019-03-07 MED ORDER — MIDAZOLAM HCL 2 MG/2ML IJ SOLN
INTRAMUSCULAR | Status: AC
  Filled 2019-03-07: qty 2

## 2019-03-07 MED ORDER — ASPIRIN 81 MG PO CHEW: 81.0000 mg | CHEWABLE_TABLET | ORAL | Status: DC

## 2019-03-07 MED ORDER — ONDANSETRON HCL 4 MG/2ML IJ SOLN: 4.0000 mg | Freq: Four times a day (QID) | INTRAMUSCULAR | Status: DC | PRN

## 2019-03-07 MED ORDER — MIDAZOLAM HCL 2 MG/2ML IJ SOLN
INTRAMUSCULAR | Status: DC | PRN
  Administered 2019-03-07: 1 mg via INTRAVENOUS

## 2019-03-07 MED ORDER — VERAPAMIL HCL 2.5 MG/ML IV SOLN
INTRAVENOUS | Status: AC
  Filled 2019-03-07: qty 2

## 2019-03-07 MED ORDER — LIDOCAINE HCL (PF) 1 % IJ SOLN
INTRAMUSCULAR | Status: AC
  Filled 2019-03-07: qty 30

## 2019-03-07 MED ORDER — METFORMIN HCL 1000 MG PO TABS: 1000.0000 mg | ORAL_TABLET | Freq: Two times a day (BID) | ORAL | Status: AC

## 2019-03-07 MED ORDER — HYDRALAZINE HCL 20 MG/ML IJ SOLN: 10.0000 mg | INTRAMUSCULAR | Status: DC | PRN

## 2019-03-07 SURGICAL SUPPLY — 19 items
CATH BALLN WEDGE 5F 110CM (CATHETERS) ×2 IMPLANT
CATH INFINITI 5 FR IM (CATHETERS) ×2 IMPLANT
CATH INFINITI 5FR MULTPACK ANG (CATHETERS) ×2 IMPLANT
CATH OPTITORQUE TIG 4.0 5F (CATHETERS) ×2 IMPLANT
DEVICE RAD COMP TR BAND LRG (VASCULAR PRODUCTS) ×2 IMPLANT
ELECT DEFIB PAD ADLT CADENCE (PAD) ×2 IMPLANT
GUIDEWIRE ANGLED .035X150CM (WIRE) ×2 IMPLANT
GUIDEWIRE INQWIRE 1.5J.035X260 (WIRE) ×1 IMPLANT
INQWIRE 1.5J .035X260CM (WIRE) ×2
KIT HEART LEFT (KITS) ×2 IMPLANT
KIT MICROPUNCTURE NIT STIFF (SHEATH) ×2 IMPLANT
PACK CARDIAC CATHETERIZATION (CUSTOM PROCEDURE TRAY) ×2 IMPLANT
SHEATH GLIDE SLENDER 4/5FR (SHEATH) ×2 IMPLANT
SHEATH PINNACLE 5F 10CM (SHEATH) ×2 IMPLANT
SHEATH PROBE COVER 6X72 (BAG) ×2 IMPLANT
SHEATH RAIN RADIAL 21G 6FR (SHEATH) ×2 IMPLANT
TRANSDUCER W/STOPCOCK (MISCELLANEOUS) ×2 IMPLANT
TUBING CIL FLEX 10 FLL-RA (TUBING) ×2 IMPLANT
WIRE EMERALD 3MM-J .035X150CM (WIRE) ×2 IMPLANT

## 2019-03-07 NOTE — Progress Notes (Signed)
Discharge instructions reviewed with pt and his son Charlotte Crumb both voice understanding.

## 2019-03-07 NOTE — Interval H&P Note (Signed)
History and Physical Interval Note:  03/07/2019 7:29 AM  Brian Bruce  has presented today for surgery, with the diagnosis of Cardiomyopathy, shortness of breath.  The various methods of treatment have been discussed with the patient and family. After consideration of risks, benefits and other options for treatment, the patient has consented to  Procedure(s): RIGHT/LEFT HEART CATH AND CORONARY/GRAFT ANGIOGRAPHY (N/A) as a surgical intervention.  The patient's history has been reviewed, patient examined, no change in status, stable for surgery.  I have reviewed the patient's chart and labs.  Questions were answered to the patient's satisfaction.    Cath Lab Visit (complete for each Cath Lab visit)  Clinical Evaluation Leading to the Procedure:   ACS: No.  Non-ACS:    Anginal/HF Classification: NYHA class II  Anti-ischemic medical therapy: Minimal Therapy (1 class of medications)  Non-Invasive Test Results: High-risk stress test findings: cardiac mortality >3%/year  Prior CABG: Previous CABG  Akilah Cureton

## 2019-03-07 NOTE — Progress Notes (Signed)
Site area: rt groin fa sheath Site Prior to Removal:  Level 0 Pressure Applied For:  20 minutes Manual:   yes Patient Status During Pull:  stable Post Pull Site:  Level  0 Post Pull Instructions Given:  yes Post Pull Pulses Present: rt dp palpable Dressing Applied:  Gauze and tegaderm Bedrest begins @ 1173 Comments:

## 2019-03-07 NOTE — Brief Op Note (Signed)
BRIEF CARDIAC CATHETERIZATION NOTE  03/07/2019  9:02 AM  PATIENT:  Brian Bruce  73 y.o. male  PRE-OPERATIVE DIAGNOSIS:  Chronic systolic heart failure  POST-OPERATIVE DIAGNOSIS:  Same  PROCEDURE:  Procedure(s): RIGHT/LEFT HEART CATH AND CORONARY/GRAFT ANGIOGRAPHY (N/A)  SURGEON:  Surgeon(s) and Role:    * Twylia Oka, MD - Primary  FINDINGS: 1. Two-vessel CAD with CTO of proximal LAD, 60% mid RCA stenosis, and CTO or proximal rPDA. 2. Widely patent LIMA jump graft to diagonal and LAD. 3. Chronically occluded SVG-RCA (unsure if single conduit or jump graft). 4. Widely patent, ungrafted RIMA. 5. Normal left and right heart filling pressures. 6. Normal Fick cardiac output/index.  RECOMMENDATIONS: 1. Continue medical therapy and secondary prevention. 2. If no bleeding from catheterization sites, okay to begin warfarin this evening.  Nelva Bush, MD [facility] Pager: [phone]

## 2019-03-07 NOTE — Discharge Instructions (Signed)
Drink plenty  of fluids, Keep left arm at or above heart levelRadial Site Care  This sheet gives you information about how to care for yourself after your procedure. Your health care provider may also give you more specific instructions. If you have problems or questions, contact your health care provider. What can I expect after the procedure? After the procedure, it is common to have:  Bruising and tenderness at the catheter insertion area. Follow these instructions at home: Medicines  Take over-the-counter and prescription medicines only as told by your health care provider. Insertion site care  Follow instructions from your health care provider about how to take care of your insertion site. Make sure you: ? Wash your hands with soap and water before you change your bandage (dressing). If soap and water are not available, use hand sanitizer. ? Change your dressing as told by your health care provider. ? Leave stitches (sutures), skin glue, or adhesive strips in place. These skin closures may need to stay in place for 2 weeks or longer. If adhesive strip edges start to loosen and curl up, you may trim the loose edges. Do not remove adhesive strips completely unless your health care provider tells you to do that.  Check your insertion site every day for signs of infection. Check for: ? Redness, swelling, or pain. ? Fluid or blood. ? Pus or a bad smell. ? Warmth.  Do not take baths, swim, or use a hot tub until your health care provider approves.  You may shower 24-48 hours after the procedure, or as directed by your health care provider. ? Remove the dressing and gently wash the site with plain soap and water. ? Pat the area dry with a clean towel. ? Do not rub the site. That could cause bleeding.  Do not apply powder or lotion to the site. Activity   For 24 hours after the procedure, or as directed by your health care provider: ? Do not flex or bend the affected arm. ? Do not  push or pull heavy objects with the affected arm. ? Do not drive yourself home from the hospital or clinic. You may drive 24 hours after the procedure unless your health care provider tells you not to. ? Do not operate machinery or power tools.  Do not lift anything that is heavier than 10 lb (4.5 kg), or the limit that you are told, until your health care provider says that it is safe.  Ask your health care provider when it is okay to: ? Return to work or school. ? Resume usual physical activities or sports. ? Resume sexual activity. General instructions  If the catheter site starts to bleed, raise your arm and put firm pressure on the site. If the bleeding does not stop, get help right away. This is a medical emergency.  If you went home on the same day as your procedure, a responsible adult should be with you for the first 24 hours after you arrive home.  Keep all follow-up visits as told by your health care provider. This is important. Contact a health care provider if:  You have a fever.  You have redness, swelling, or yellow drainage around your insertion site. Get help right away if:  You have unusual pain at the radial site.  The catheter insertion area swells very fast.  The insertion area is bleeding, and the bleeding does not stop when you hold steady pressure on the area.  Your arm or hand becomes  pale, cool, tingly, or numb. These symptoms may represent a serious problem that is an emergency. Do not wait to see if the symptoms will go away. Get medical help right away. Call your local emergency services (911 in the [place] Do not drive yourself to the hospital. Summary  After the procedure, it is common to have bruising and tenderness at the site.  Follow instructions from your health care provider about how to take care of your radial site wound. Check the wound every day for signs of infection.  Do not lift anything that is heavier than 10 lb (4.5 kg), or the limit  that you are told, until your health care provider says that it is safe. This information is not intended to replace advice given to you by your health care provider. Make sure you discuss any questions you have with your health care provider. Document Released: 08/20/2010 Document Revised: 08/23/2017 Document Reviewed: 08/23/2017 Elsevier Patient Education  2020 [employer].

## 2019-03-07 NOTE — H&P (Signed)
Cardiology Admission History and Physical:   Patient ID: Brian Bruce MRN: [id number]; DOB: Feb 21, 1946   Admission date: 03/07/2019  Primary Care Provider: System, [facility] Not In Primary Cardiologist: Birdie Sons, MD - [facility] Primary Electrophysiologist:  None   Chief Complaint:  Shortness of breath  Patient Profile:   Brian Bruce is a 73 y.o. male with history of coronary artery disease status post remote CABG (1998; reportedly 4-vessel), chronic HFpEF with recent decline in LVEF, persistent atrial fibrillation, and type 2 diabetes mellitus, presenting for evaluation of shortness of breath and cardiomyopathy  History of Present Illness:   Brian Bruce developed worsening shortness of breath and chest tightness accompanied by progressive weight gain this spring.  He was initially hospitalized in early March at [facility] with acute heart failure.  He was discharged after IV diuresis but subsequently rehospitalized with worsening heart failure.  He was ultimately transferred to [facility] and was admitted to the ICU for management of cardiogenic shock and low output heart failure requiring inotrope therapy.  Following a 20-day hospitalization, Brian Bruce  reports that his breathing was much improved.  He also lost over 30 pounds while at [facility].  He has continued to have gradual weight decline, though this seems to have plateaued.  Currently, Brian Bruce reports feeling well.  He has minimal exertional dyspnea.  He denies chest pain, palpitations, lightheadedness, orthopnea, and edema.  He has been compliant with his medications.  Following his last visit with Dr. Olevia Bruce in late June, Brian Bruce underwent myocardial perfusion stress test for evaluation of his cardiomyopathy.  This revealed evidence of prior infarct and severely reduced LVEF.  Given these findings, he has been referred for right and left heart catheterization and possible PCI.  Heart Pathway Score:     Past Medical  History:  Diagnosis Date  . Asthma   . CHF (congestive heart failure) ([place])   . Coronary artery disease   . Diabetes mellitus without complication ([place])   . Persistent atrial fibrillation     Past Surgical History:  Procedure Laterality Date  . CORONARY ARTERY BYPASS GRAFT  1998   4-vessel     Medications Prior to Admission: Prior to Admission medications   Medication Sig Start Date Deborahann Poteat Date Taking? Authorizing Provider  acetaminophen (TYLENOL) 500 MG tablet Take 500 mg by mouth every 6 (six) hours as needed for moderate pain.   Yes [provider]  aspirin EC 81 MG tablet Take 81 mg by mouth daily.   Yes [provider]  atorvastatin (LIPITOR) 80 MG tablet Take 80 mg by mouth every evening.   Yes [provider]  augmented betamethasone dipropionate (DIPROLENE-AF) 0.05 % cream Apply 1 application topically 2 (two) times daily as needed (rash).   Yes [provider]  candesartan (ATACAND) 16 MG tablet Take 16 mg by mouth daily.   Yes [provider]  carvedilol (COREG) 12.5 MG tablet Take 12.5 mg by mouth 2 (two) times daily.   Yes [provider]  Cholecalciferol (VITAMIN D) 50 MCG (2000 UT) tablet Take 2,000 Units by mouth daily.   Yes [provider]  Coenzyme Q10 (CO Q-10) 100 MG CAPS Take 100 mg by mouth daily.   Yes [provider]  digoxin (LANOXIN) 0.125 MG tablet Take 0.125 mg by mouth daily.   Yes [provider]  GLUCOSAMINE-CHONDROITIN PO Take 1 tablet by mouth 2 (two) times daily.   Yes [provider]  ketotifen (ALAWAY) 0.025 %  ophthalmic solution Place 1 drop into both eyes daily.   Yes [provider]  magnesium oxide (MAG-OX) 400 MG tablet Take 400 mg by mouth 2 (two) times daily.   Yes [provider]  metFORMIN (GLUCOPHAGE) 1000 MG tablet Take 1,000 mg by mouth 2 (two) times daily.   Yes [provider]  Omega-3 Fatty Acids (FISH OIL) 1000 MG CAPS  Take 1,000 mg by mouth daily.   Yes [provider]  spironolactone (ALDACTONE) 25 MG tablet Pt taking 1/2 of a 50 mg tablet daily 03/05/19  Yes Izel Hochberg, Harrell Gave, MD  torsemide (DEMADEX) 20 MG tablet Take 20 mg by mouth daily.   Yes [provider]  warfarin (COUMADIN) 5 MG tablet Take 5 mg by mouth every evening.   Yes [provider]     Allergies:   No Known Allergies  Social History:   Social History   Tobacco Use  . Smoking status: Former Smoker    Types: Cigarettes    Quit date: 1980    Years since quitting: 40.6  . Smokeless tobacco: Never Used  Substance Use Topics  . Alcohol use: Not Currently    Frequency: Never  . Drug use: Never     Family History: The patient's family history includes Cancer in his mother; Heart failure in his father.    ROS:  Please see the history of present illness.  All other ROS reviewed and negative.     Physical Exam/Data:   Vitals:   03/07/19 0605  BP: (!) 112/52  Pulse: 75  Resp: 18  Temp: 97.7 F (36.5 C)  TempSrc: Temporal  SpO2: 98%  Weight: 77.1 kg  Height: 5\' 8"  (1.727 m)   No intake or output data in the 24 hours ending 03/07/19 0708 Last 3 Weights 03/07/2019  Weight (lbs) 170 lb  Weight (kg) 77.111 kg     Body mass index is 25.85 kg/m.  General:  Well nourished, well developed, in no acute distress HEENT: normal Lymph: no adenopathy Neck: no JVD Endocrine:  No thryomegaly Vascular: No carotid bruits; radial and femoral pulses 2+ bilaterally. Cardiac: Irregularly irregular with 2/6 holosystolic murmur loudest at the left lower sternal border. Lungs:  clear to auscultation bilaterally, no wheezing, rhonchi or rales  Abd: soft, nontender, no hepatomegaly  Ext: no edema Musculoskeletal:  No deformities, BUE and BLE strength normal and equal Skin: warm and dry  Neuro:  CNs 2-12 intact, no focal abnormalities noted Psych:  Normal affect    EKG:  The ECG that was done today was personally  reviewed and demonstrates atrial fibrillation with left bundle branch block  Relevant CV Studies: Limited TTE (11/23/2018): Moderately dilated left ventricle with severely reduced LVEF.  Moderately reduced RV with moderately reduced contraction.  No significant pericardial effusion.  Moderate to severe mitral regurgitation.  Laboratory Data:  High Sensitivity Troponin:  No results for input(s): TROPONINIHS in the last 720 hours.    Cardiac EnzymesNo results for input(s): TROPONINI in the last 168 hours. No results for input(s): TROPIPOC in the last 168 hours.  Chemistry Recent Labs  Lab 03/04/19 1033  NA 129*  K 5.2*  CL 95*  CO2 25  GLUCOSE 132*  BUN 19  CREATININE 0.81  CALCIUM 9.5  GFRNONAA >60  GFRAA >60  ANIONGAP 9    No results for input(s): PROT, ALBUMIN, AST, ALT, ALKPHOS, BILITOT in the last 168 hours. Hematology Recent Labs  Lab 03/04/19 1045  WBC 6.2  RBC 3.83*  HGB 11.6*  HCT 34.8*  MCV 90.9  MCH 30.3  MCHC 33.3  RDW 15.3  PLT 249   BNPNo results for input(s): BNP, PROBNP in the last 168 hours.  DDimer No results for input(s): DDIMER in the last 168 hours.   Radiology/Studies:  No results found.  Assessment and Plan:   Chronic systolic heart failure: Symptomatically, patient has improved significantly since his hospitalization this spring at [facility].  LVEF is now noted to be severely reduced, with myocardial perfusion stress test demonstrating evidence of prior infarct.  Given these findings, the patient has been referred for left and right heart catheterization.  I have reviewed the risks, indications, and alternatives to cardiac catheterization, possible angioplasty, and stenting with the patient. Risks include but are not limited to bleeding, infection, vascular injury, stroke, myocardial infection, arrhythmia, kidney injury, radiation-related injury in the case of prolonged fluoroscopy use, emergency cardiac surgery, and death. The patient understands the  risks of serious complication is 1-2 in 4268 with diagnostic cardiac cath and 1-2% or less with angioplasty/stenting.  Further medication recommendations to be made following catheterization.  Coronary artery disease: No angina reported, though stress test abnormal with severely reduced LVEF.  Proceed with catheterization, as outlined above.  Further recommendations to be made after catheterization.  Persistent atrial fibrillation: Heart rate adequately controlled.  Patient chronically anticoagulated with warfarin, which can hopefully be restarted following catheterization today.  For questions or updates, please contact [place] Please consult [email or web address] for contact info under   Signed, Nelva Bush, MD  03/07/2019 7:08 AM

## 2019-03-08 ENCOUNTER — Encounter (HOSPITAL_COMMUNITY): Payer: Self-pay | Admitting: Internal Medicine

## 2019-03-18 DIAGNOSIS — Z125 Encounter for screening for malignant neoplasm of prostate: Secondary | ICD-10-CM | POA: Diagnosis not present

## 2019-03-18 DIAGNOSIS — I5022 Chronic systolic (congestive) heart failure: Secondary | ICD-10-CM | POA: Diagnosis not present

## 2019-03-18 DIAGNOSIS — E119 Type 2 diabetes mellitus without complications: Secondary | ICD-10-CM | POA: Diagnosis not present

## 2019-03-18 DIAGNOSIS — Z0000 Encounter for general adult medical examination without abnormal findings: Secondary | ICD-10-CM | POA: Diagnosis not present

## 2019-03-18 DIAGNOSIS — I10 Essential (primary) hypertension: Secondary | ICD-10-CM | POA: Diagnosis not present

## 2019-03-18 DIAGNOSIS — Z1389 Encounter for screening for other disorder: Secondary | ICD-10-CM | POA: Diagnosis not present

## 2019-03-20 DIAGNOSIS — I420 Dilated cardiomyopathy: Secondary | ICD-10-CM | POA: Diagnosis not present

## 2019-03-20 DIAGNOSIS — R030 Elevated blood-pressure reading, without diagnosis of hypertension: Secondary | ICD-10-CM | POA: Diagnosis not present

## 2019-03-20 DIAGNOSIS — I517 Cardiomegaly: Secondary | ICD-10-CM | POA: Diagnosis not present

## 2019-03-20 DIAGNOSIS — I340 Nonrheumatic mitral (valve) insufficiency: Secondary | ICD-10-CM | POA: Diagnosis not present

## 2019-03-25 DIAGNOSIS — I2510 Atherosclerotic heart disease of native coronary artery without angina pectoris: Secondary | ICD-10-CM | POA: Diagnosis not present

## 2019-03-25 DIAGNOSIS — I361 Nonrheumatic tricuspid (valve) insufficiency: Secondary | ICD-10-CM | POA: Diagnosis not present

## 2019-03-25 DIAGNOSIS — I5082 Biventricular heart failure: Secondary | ICD-10-CM | POA: Diagnosis not present

## 2019-03-25 DIAGNOSIS — I4819 Other persistent atrial fibrillation: Secondary | ICD-10-CM | POA: Diagnosis not present

## 2019-03-25 DIAGNOSIS — E119 Type 2 diabetes mellitus without complications: Secondary | ICD-10-CM | POA: Diagnosis not present

## 2019-04-22 DIAGNOSIS — I10 Essential (primary) hypertension: Secondary | ICD-10-CM | POA: Diagnosis not present

## 2019-04-22 DIAGNOSIS — I340 Nonrheumatic mitral (valve) insufficiency: Secondary | ICD-10-CM | POA: Diagnosis not present

## 2019-04-22 DIAGNOSIS — I4819 Other persistent atrial fibrillation: Secondary | ICD-10-CM | POA: Diagnosis not present

## 2019-04-22 DIAGNOSIS — I2510 Atherosclerotic heart disease of native coronary artery without angina pectoris: Secondary | ICD-10-CM | POA: Diagnosis not present

## 2019-04-22 DIAGNOSIS — I5022 Chronic systolic (congestive) heart failure: Secondary | ICD-10-CM | POA: Diagnosis not present

## 2019-04-25 ENCOUNTER — Telehealth: Payer: Self-pay

## 2019-04-25 NOTE — Telephone Encounter (Signed)
Spoke with pt regarding appt on 05/01/19. Pt stated he would like a phone call for his appt. Pt was advise to check his vitals prior to his appt. Pt questions were address.

## 2019-05-01 ENCOUNTER — Other Ambulatory Visit: Payer: Self-pay | Admitting: *Deleted

## 2019-05-01 ENCOUNTER — Telehealth: Payer: Self-pay

## 2019-05-01 ENCOUNTER — Telehealth (INDEPENDENT_AMBULATORY_CARE_PROVIDER_SITE_OTHER): Payer: Medicare Other | Admitting: Internal Medicine

## 2019-05-01 ENCOUNTER — Encounter: Payer: Self-pay | Admitting: Internal Medicine

## 2019-05-01 ENCOUNTER — Other Ambulatory Visit: Payer: Self-pay

## 2019-05-01 VITALS — BP 135/67 | HR 55 | Ht 68.0 in | Wt 174.0 lb

## 2019-05-01 DIAGNOSIS — I447 Left bundle-branch block, unspecified: Secondary | ICD-10-CM

## 2019-05-01 DIAGNOSIS — I255 Ischemic cardiomyopathy: Secondary | ICD-10-CM

## 2019-05-01 DIAGNOSIS — I4819 Other persistent atrial fibrillation: Secondary | ICD-10-CM

## 2019-05-01 NOTE — Progress Notes (Signed)
Electrophysiology TeleHealth Note   Due to national recommendations of social distancing due to [place] 19, Audio telehealth visit is felt to be most appropriate for this patient at this time.verbal consent was obtained for telehealth visit today   Date:  05/01/2019   ID:  Brian Bruce, DOB [birth year], MRN [id number]  Location: home  Provider location: [place] Evaluation Performed: New patient consult  PCP:  System, Pcp Not In  Cardiologist:  Dr Dot Lanes [facility]) Electrophysiologist:  None   Chief Complaint:  CHF  History of Present Illness:    Brian Bruce is a 73 y.o. male who presents via audio/video conferencing for a telehealth visit today.   The patient is referred for new consultation regarding afib and CHF by Dr Dot Lanes. He reports doing well s/p CABG in 1998, without further heart issues.  He developed progressive symptoms of CHF since march.  He reports symptoms of significant volume overload with SOB, edema, and increased abdominal girth.  He was found to have advanced CHF with EF 15%.  He required aggressive diuresis for about "a month" at [facility].  He reports losing near 50 lbs of fluid!  Since that that time, he has done reasonably well.  He has symptoms of fatigue and decreased exercise tolerance with moderate exertion.  He is persistently in afib.  Today, he denies symptoms of palpitations, chest pain, orthopnea, PND, lower extremity edema, claudication, dizziness, presyncope, syncope, bleeding, or neurologic sequela. The patient is tolerating medications without difficulties and is otherwise without complaint today.    Past Medical History:  Diagnosis Date  . Asthma   . CHF (congestive heart failure) ([place])   . Coronary artery disease   . Diabetes mellitus without complication ([place])   . Ischemic cardiomyopathy   . LBBB (left bundle branch block)   . Persistent atrial fibrillation     Past Surgical History:  Procedure Laterality Date  . CORONARY  ARTERY BYPASS GRAFT  1998   4-vessel  . RIGHT/LEFT HEART CATH AND CORONARY/GRAFT ANGIOGRAPHY N/A 03/07/2019   Procedure: RIGHT/LEFT HEART CATH AND CORONARY/GRAFT ANGIOGRAPHY;  Surgeon: Nelva Bush, MD;  Location: [place] CV LAB;  Service: Cardiovascular;  Laterality: N/A;    Current Outpatient Medications  Medication Sig Dispense Refill  . acetaminophen (TYLENOL) 500 MG tablet Take 500 mg by mouth every 6 (six) hours as needed for moderate pain.    Marland Kitchen aspirin EC 81 MG tablet Take 81 mg by mouth daily.    Marland Kitchen atorvastatin (LIPITOR) 80 MG tablet Take 80 mg by mouth every evening.    Marland Kitchen augmented betamethasone dipropionate (DIPROLENE-AF) 0.05 % cream Apply 1 application topically 2 (two) times daily as needed (rash).    . candesartan (ATACAND) 16 MG tablet Take 16 mg by mouth daily.    . carvedilol (COREG) 25 MG tablet Take 1 tablet by mouth 2 (two) times daily.    . Cholecalciferol (VITAMIN D) 50 MCG (2000 UT) tablet Take 2,000 Units by mouth daily.    . Coenzyme Q10 (CO Q-10) 100 MG CAPS Take 100 mg by mouth daily.    . digoxin (LANOXIN) 0.125 MG tablet Take 0.125 mg by mouth daily.    Marland Kitchen GLUCOSAMINE-CHONDROITIN PO Take 1 tablet by mouth 2 (two) times daily.    Marland Kitchen ketotifen (ALAWAY) 0.025 % ophthalmic solution Place 1 drop into both eyes daily.    . magnesium oxide (MAG-OX) 400 (241.3 Mg) MG tablet Take 1 tablet by mouth 2 (two) times daily.    Marland Kitchen  metFORMIN (GLUCOPHAGE) 1000 MG tablet Take 1 tablet (1,000 mg total) by mouth 2 (two) times daily.    . Omega-3 Fatty Acids (FISH OIL) 1000 MG CAPS Take 1,000 mg by mouth daily.    Marland Kitchen spironolactone (ALDACTONE) 25 MG tablet Pt taking 1/2 of a 50 mg tablet daily    . torsemide (DEMADEX) 20 MG tablet Take 1 tablet (20 mg total) by mouth daily.    Marland Kitchen warfarin (COUMADIN) 5 MG tablet Take 5 mg by mouth every evening. Use as directed     No current facility-administered medications for this visit.     Allergies:   Patient has no known allergies.    Social History:  The patient  reports that he quit smoking about 40 years ago. His smoking use included cigarettes. He has never used smokeless tobacco. He reports current alcohol use. He reports that he does not use drugs.   Family History:  The patient's family history includes Cancer in his mother; Heart failure in his father.    ROS:  Please see the history of present illness.   All other systems are personally reviewed and negative.    Exam:    Vital Signs:  BP 135/67   Pulse (!) 55   Ht 5\' 8"  (1.727 m)   Wt 174 lb (78.9 kg)   SpO2 96%   BMI 26.46 kg/m   Well sunding, alert and conversant  Labs/Other Tests and Data Reviewed:    Recent Labs: 03/04/2019: BUN 19; Creatinine, Ser 0.81; Platelets 249 03/07/2019: Hemoglobin 11.6; Potassium 4.6; Sodium 135   Wt Readings from Last 3 Encounters:  05/01/19 174 lb (78.9 kg)  03/07/19 170 lb (77.1 kg)     Other studies personally reviewed: Additional studies/ records that were reviewed today include: records from [facility], prior ekg, echo, cath  Review of the above records today demonstrates: as above   ASSESSMENT & PLAN:    1.  Ischemic CM/ chronic systolic dysfunction/ CAD/ LBBB The patient has an ischemic CM (EF 15%), NYHA Class III CHF, and CAD.  He has LBBB with QRS > 150 msec.  He is referred by Dr Dot Lanes for risk stratification of sudden death and consideration of BiV ICD implantation.  At this time, he meets MADIT II/ SCD-HeFT criteria for ICD implantation for primary prevention of sudden death.  Given LBBB QRS with QRS duration of >150 msec, he has a class I indication for CRT.  I have had a thorough discussion with the patient reviewing options.  The patient has had opportunities to ask questions and have them answered. The patient and I have decided together through a shared decision making process to proceed with BiV ICD at this time.   Risks, benefits, alternatives to BiV ICD implantation were discussed in detail with the  patient today. The patient understands that the risks include but are not limited to bleeding, infection, pneumothorax, perforation, tamponade, vascular damage, renal failure, MI, stroke, death, inappropriate shocks, and lead dislodgement and wishes to proceed.  We will therefore schedule device implantation at the next available time.  Hold coumadin for 48 hours prior to implant.  2. Persistent atrial fibrillation Continue coumadin for chads2vasc score of at least 4.  Hold for ICD implant as above We will consider AAD therapy (tikosyn vs amiodarone) and ablation as options after he has recovered from CRT-D implantation  Current medicines are reviewed at length with the patient today.   The patient does not have concerns regarding his medicines.  The  following changes were made today:  none  Labs/ tests ordered today include:  No orders of the defined types were placed in this encounter.   Patient Risk:  after full review of this patients clinical status, I feel that they are at moderate risk at this time.   Today, I have spent 20 minutes with the patient with telehealth technology discussing CHF and afib .    Signed, Thompson Grayer MD, [place] 05/01/2019 10:14 AM   [place] [address] [phone] (office) [phone] (fax)

## 2019-05-01 NOTE — H&P (View-Only) (Signed)
Electrophysiology TeleHealth Note   Due to national recommendations of social distancing due to [place] 19, Audio telehealth visit is felt to be most appropriate for this patient at this time.verbal consent was obtained for telehealth visit today   Date:  05/01/2019   ID:  Brian Bruce, DOB [birth year], MRN [id number]  Location: home  Provider location: [place] Evaluation Performed: New patient consult  PCP:  System, Pcp Not In  Cardiologist:  Dr Dot Lanes [facility]) Electrophysiologist:  None   Chief Complaint:  CHF  History of Present Illness:    Brian Bruce is a 73 y.o. male who presents via audio/video conferencing for a telehealth visit today.   The patient is referred for new consultation regarding afib and CHF by Dr Dot Lanes. He reports doing well s/p CABG in 1998, without further heart issues.  He developed progressive symptoms of CHF since march.  He reports symptoms of significant volume overload with SOB, edema, and increased abdominal girth.  He was found to have advanced CHF with EF 15%.  He required aggressive diuresis for about "a month" at [facility].  He reports losing near 50 lbs of fluid!  Since that that time, he has done reasonably well.  He has symptoms of fatigue and decreased exercise tolerance with moderate exertion.  He is persistently in afib.  Today, he denies symptoms of palpitations, chest pain, orthopnea, PND, lower extremity edema, claudication, dizziness, presyncope, syncope, bleeding, or neurologic sequela. The patient is tolerating medications without difficulties and is otherwise without complaint today.    Past Medical History:  Diagnosis Date  . Asthma   . CHF (congestive heart failure) ([place])   . Coronary artery disease   . Diabetes mellitus without complication ([place])   . Ischemic cardiomyopathy   . LBBB (left bundle branch block)   . Persistent atrial fibrillation     Past Surgical History:  Procedure Laterality Date  . CORONARY  ARTERY BYPASS GRAFT  1998   4-vessel  . RIGHT/LEFT HEART CATH AND CORONARY/GRAFT ANGIOGRAPHY N/A 03/07/2019   Procedure: RIGHT/LEFT HEART CATH AND CORONARY/GRAFT ANGIOGRAPHY;  Surgeon: Nelva Bush, MD;  Location: [place] CV LAB;  Service: Cardiovascular;  Laterality: N/A;    Current Outpatient Medications  Medication Sig Dispense Refill  . acetaminophen (TYLENOL) 500 MG tablet Take 500 mg by mouth every 6 (six) hours as needed for moderate pain.    Marland Kitchen aspirin EC 81 MG tablet Take 81 mg by mouth daily.    Marland Kitchen atorvastatin (LIPITOR) 80 MG tablet Take 80 mg by mouth every evening.    Marland Kitchen augmented betamethasone dipropionate (DIPROLENE-AF) 0.05 % cream Apply 1 application topically 2 (two) times daily as needed (rash).    . candesartan (ATACAND) 16 MG tablet Take 16 mg by mouth daily.    . carvedilol (COREG) 25 MG tablet Take 1 tablet by mouth 2 (two) times daily.    . Cholecalciferol (VITAMIN D) 50 MCG (2000 UT) tablet Take 2,000 Units by mouth daily.    . Coenzyme Q10 (CO Q-10) 100 MG CAPS Take 100 mg by mouth daily.    . digoxin (LANOXIN) 0.125 MG tablet Take 0.125 mg by mouth daily.    Marland Kitchen GLUCOSAMINE-CHONDROITIN PO Take 1 tablet by mouth 2 (two) times daily.    Marland Kitchen ketotifen (ALAWAY) 0.025 % ophthalmic solution Place 1 drop into both eyes daily.    . magnesium oxide (MAG-OX) 400 (241.3 Mg) MG tablet Take 1 tablet by mouth 2 (two) times daily.    Marland Kitchen  metFORMIN (GLUCOPHAGE) 1000 MG tablet Take 1 tablet (1,000 mg total) by mouth 2 (two) times daily.    . Omega-3 Fatty Acids (FISH OIL) 1000 MG CAPS Take 1,000 mg by mouth daily.    Marland Kitchen spironolactone (ALDACTONE) 25 MG tablet Pt taking 1/2 of a 50 mg tablet daily    . torsemide (DEMADEX) 20 MG tablet Take 1 tablet (20 mg total) by mouth daily.    Marland Kitchen warfarin (COUMADIN) 5 MG tablet Take 5 mg by mouth every evening. Use as directed     No current facility-administered medications for this visit.     Allergies:   Patient has no known allergies.    Social History:  The patient  reports that he quit smoking about 40 years ago. His smoking use included cigarettes. He has never used smokeless tobacco. He reports current alcohol use. He reports that he does not use drugs.   Family History:  The patient's family history includes Cancer in his mother; Heart failure in his father.    ROS:  Please see the history of present illness.   All other systems are personally reviewed and negative.    Exam:    Vital Signs:  BP 135/67   Pulse (!) 55   Ht 5\' 8"  (1.727 m)   Wt 174 lb (78.9 kg)   SpO2 96%   BMI 26.46 kg/m   Well sunding, alert and conversant  Labs/Other Tests and Data Reviewed:    Recent Labs: 03/04/2019: BUN 19; Creatinine, Ser 0.81; Platelets 249 03/07/2019: Hemoglobin 11.6; Potassium 4.6; Sodium 135   Wt Readings from Last 3 Encounters:  05/01/19 174 lb (78.9 kg)  03/07/19 170 lb (77.1 kg)     Other studies personally reviewed: Additional studies/ records that were reviewed today include: records from [facility], prior ekg, echo, cath  Review of the above records today demonstrates: as above   ASSESSMENT & PLAN:    1.  Ischemic CM/ chronic systolic dysfunction/ CAD/ LBBB The patient has an ischemic CM (EF 15%), NYHA Class III CHF, and CAD.  He has LBBB with QRS > 150 msec.  He is referred by Dr Dot Lanes for risk stratification of sudden death and consideration of BiV ICD implantation.  At this time, he meets MADIT II/ SCD-HeFT criteria for ICD implantation for primary prevention of sudden death.  Given LBBB QRS with QRS duration of >150 msec, he has a class I indication for CRT.  I have had a thorough discussion with the patient reviewing options.  The patient has had opportunities to ask questions and have them answered. The patient and I have decided together through a shared decision making process to proceed with BiV ICD at this time.   Risks, benefits, alternatives to BiV ICD implantation were discussed in detail with the  patient today. The patient understands that the risks include but are not limited to bleeding, infection, pneumothorax, perforation, tamponade, vascular damage, renal failure, MI, stroke, death, inappropriate shocks, and lead dislodgement and wishes to proceed.  We will therefore schedule device implantation at the next available time.  Hold coumadin for 48 hours prior to implant.  2. Persistent atrial fibrillation Continue coumadin for chads2vasc score of at least 4.  Hold for ICD implant as above We will consider AAD therapy (tikosyn vs amiodarone) and ablation as options after he has recovered from CRT-D implantation  Current medicines are reviewed at length with the patient today.   The patient does not have concerns regarding his medicines.  The  following changes were made today:  none  Labs/ tests ordered today include:  No orders of the defined types were placed in this encounter.   Patient Risk:  after full review of this patients clinical status, I feel that they are at moderate risk at this time.   Today, I have spent 20 minutes with the patient with telehealth technology discussing CHF and afib .    Signed, Thompson Grayer MD, [place] 05/01/2019 10:14 AM   [place] [address] [phone] (office) [phone] (fax)

## 2019-05-01 NOTE — Telephone Encounter (Signed)
Returned call to Pt.  Pt scheduled for BiV ICD on 10/15  All instruction given.  Pt will also pick up printed instruction letter from [facility]  Work up complete.

## 2019-05-01 NOTE — Telephone Encounter (Signed)
-----   Message from Thompson Grayer, MD sent at 05/01/2019 10:21 AM EDT ----- Schedule for BiV ICD implantation at next available time [facility]) Hold coumadin 48 hours prior

## 2019-05-03 DIAGNOSIS — Z7901 Long term (current) use of anticoagulants: Secondary | ICD-10-CM | POA: Diagnosis not present

## 2019-05-08 DIAGNOSIS — Z23 Encounter for immunization: Secondary | ICD-10-CM | POA: Diagnosis not present

## 2019-05-13 ENCOUNTER — Other Ambulatory Visit (HOSPITAL_COMMUNITY)
Admission: RE | Admit: 2019-05-13 | Discharge: 2019-05-13 | Disposition: A | Discharge status: Home / Self Care | Payer: Medicare Other | Source: Ambulatory Visit | Attending: Internal Medicine | Admitting: Internal Medicine

## 2019-05-13 ENCOUNTER — Other Ambulatory Visit: Payer: Self-pay | Admitting: *Deleted

## 2019-05-13 ENCOUNTER — Other Ambulatory Visit: Payer: Self-pay

## 2019-05-13 DIAGNOSIS — Z01812 Encounter for preprocedural laboratory examination: Secondary | ICD-10-CM | POA: Diagnosis not present

## 2019-05-13 DIAGNOSIS — Z01818 Encounter for other preprocedural examination: Secondary | ICD-10-CM

## 2019-05-13 DIAGNOSIS — Z20828 Contact with and (suspected) exposure to other viral communicable diseases: Secondary | ICD-10-CM | POA: Diagnosis not present

## 2019-05-13 DIAGNOSIS — I5022 Chronic systolic (congestive) heart failure: Secondary | ICD-10-CM

## 2019-05-13 LAB — CBC
HCT: 35 % — ABNORMAL LOW (ref 39.0–52.0)
Hemoglobin: 12 g/dL — ABNORMAL LOW (ref 13.0–17.0)
MCH: 32.9 pg (ref 26.0–34.0)
MCHC: 34.3 g/dL (ref 30.0–36.0)
MCV: 95.9 fL (ref 80.0–100.0)
Platelets: 261 10*3/uL (ref 150–400)
RBC: 3.65 MIL/uL — ABNORMAL LOW (ref 4.22–5.81)
RDW: 12.4 % (ref 11.5–15.5)
WBC: 5.6 10*3/uL (ref 4.0–10.5)
nRBC: 0 % (ref 0.0–0.2)

## 2019-05-13 LAB — BASIC METABOLIC PANEL
Anion gap: 11 (ref 5–15)
BUN: 15 mg/dL (ref 8–23)
CO2: 24 mmol/L (ref 22–32)
Calcium: 9.3 mg/dL (ref 8.9–10.3)
Chloride: 95 mmol/L — ABNORMAL LOW (ref 98–111)
Creatinine, Ser: 0.79 mg/dL (ref 0.61–1.24)
GFR calc Af Amer: >60 mL/min (ref >60)
GFR calc non Af Amer: >60 mL/min (ref >60)
Glucose, Bld: 125 mg/dL — ABNORMAL HIGH (ref 70–99)
Potassium: 4.9 mmol/L (ref 3.5–5.1)
Sodium: 130 mmol/L — ABNORMAL LOW (ref 135–145)

## 2019-05-13 LAB — SARS CORONAVIRUS 2 (TAT 6-24 HRS): SARS Coronavirus 2: NEGATIVE

## 2019-05-14 NOTE — Telephone Encounter (Signed)
Call returned to Pt.  Pt checking to make sure labs/covid test were ok.  Verified Pt was good for his procedure.    No further needs.

## 2019-05-14 NOTE — Telephone Encounter (Signed)
° ° °  Patient calling to confirm 10/15 procedure and instructions

## 2019-05-16 ENCOUNTER — Encounter (HOSPITAL_COMMUNITY)
Admission: RE | Disposition: A | Discharge status: Home / Self Care | Payer: Self-pay | Source: Home / Self Care | Attending: Internal Medicine

## 2019-05-16 ENCOUNTER — Other Ambulatory Visit: Payer: Self-pay

## 2019-05-16 ENCOUNTER — Ambulatory Visit (HOSPITAL_COMMUNITY)
Admission: RE | Admit: 2019-05-16 | Discharge: 2019-05-17 | Disposition: A | Discharge status: Home / Self Care | Payer: Medicare Other | Attending: Internal Medicine | Admitting: Internal Medicine

## 2019-05-16 DIAGNOSIS — J45909 Unspecified asthma, uncomplicated: Secondary | ICD-10-CM | POA: Insufficient documentation

## 2019-05-16 DIAGNOSIS — Z7982 Long term (current) use of aspirin: Secondary | ICD-10-CM | POA: Insufficient documentation

## 2019-05-16 DIAGNOSIS — Z006 Encounter for examination for normal comparison and control in clinical research program: Secondary | ICD-10-CM | POA: Insufficient documentation

## 2019-05-16 DIAGNOSIS — Z951 Presence of aortocoronary bypass graft: Secondary | ICD-10-CM | POA: Insufficient documentation

## 2019-05-16 DIAGNOSIS — Z87891 Personal history of nicotine dependence: Secondary | ICD-10-CM | POA: Insufficient documentation

## 2019-05-16 DIAGNOSIS — I4819 Other persistent atrial fibrillation: Secondary | ICD-10-CM | POA: Diagnosis not present

## 2019-05-16 DIAGNOSIS — I255 Ischemic cardiomyopathy: Secondary | ICD-10-CM | POA: Diagnosis not present

## 2019-05-16 DIAGNOSIS — I5022 Chronic systolic (congestive) heart failure: Secondary | ICD-10-CM | POA: Diagnosis not present

## 2019-05-16 DIAGNOSIS — Z7984 Long term (current) use of oral hypoglycemic drugs: Secondary | ICD-10-CM | POA: Diagnosis not present

## 2019-05-16 DIAGNOSIS — I447 Left bundle-branch block, unspecified: Secondary | ICD-10-CM | POA: Diagnosis not present

## 2019-05-16 DIAGNOSIS — I2510 Atherosclerotic heart disease of native coronary artery without angina pectoris: Secondary | ICD-10-CM | POA: Insufficient documentation

## 2019-05-16 DIAGNOSIS — Z8249 Family history of ischemic heart disease and other diseases of the circulatory system: Secondary | ICD-10-CM | POA: Diagnosis not present

## 2019-05-16 DIAGNOSIS — Z7901 Long term (current) use of anticoagulants: Secondary | ICD-10-CM | POA: Insufficient documentation

## 2019-05-16 DIAGNOSIS — E119 Type 2 diabetes mellitus without complications: Secondary | ICD-10-CM | POA: Diagnosis not present

## 2019-05-16 DIAGNOSIS — Z959 Presence of cardiac and vascular implant and graft, unspecified: Secondary | ICD-10-CM

## 2019-05-16 DIAGNOSIS — Z79899 Other long term (current) drug therapy: Secondary | ICD-10-CM | POA: Insufficient documentation

## 2019-05-16 DIAGNOSIS — I519 Heart disease, unspecified: Secondary | ICD-10-CM | POA: Diagnosis present

## 2019-05-16 HISTORY — PX: BIV ICD INSERTION CRT-D: EP1195

## 2019-05-16 LAB — SURGICAL PCR SCREEN
MRSA, PCR: NEGATIVE
Staphylococcus aureus: NEGATIVE

## 2019-05-16 LAB — HEMOGLOBIN A1C
Hgb A1c MFr Bld: 6.4 % — ABNORMAL HIGH (ref 4.8–5.6)
Mean Plasma Glucose: 136.98 mg/dL

## 2019-05-16 LAB — PROTIME-INR
INR: 1.4 — ABNORMAL HIGH (ref 0.8–1.2)
Prothrombin Time: 16.6 seconds — ABNORMAL HIGH (ref 11.4–15.2)

## 2019-05-16 LAB — GLUCOSE, CAPILLARY
Glucose-Capillary: 138 mg/dL — ABNORMAL HIGH (ref 70–99)
Glucose-Capillary: 91 mg/dL (ref 70–99)

## 2019-05-16 SURGERY — BIV ICD INSERTION CRT-D

## 2019-05-16 MED ORDER — SODIUM CHLORIDE 0.9% FLUSH: 3.0000 mL | INTRAVENOUS | Status: DC | PRN

## 2019-05-16 MED ORDER — CHLORHEXIDINE GLUCONATE 4 % EX LIQD
60.0000 mL | Freq: Once | CUTANEOUS | Status: DC
  Filled 2019-05-16: qty 60

## 2019-05-16 MED ORDER — SPIRONOLACTONE 25 MG PO TABS
25.0000 mg | ORAL_TABLET | Freq: Every day | ORAL | Status: DC
  Administered 2019-05-16 – 2019-05-17 (×2): 25 mg via ORAL
  Filled 2019-05-16 (×2): qty 1

## 2019-05-16 MED ORDER — SODIUM CHLORIDE 0.9 % IV SOLN
80.0000 mg | INTRAVENOUS | Status: AC
  Administered 2019-05-16: 80 mg

## 2019-05-16 MED ORDER — MAGNESIUM OXIDE 400 (241.3 MG) MG PO TABS
400.0000 mg | ORAL_TABLET | Freq: Two times a day (BID) | ORAL | Status: DC
  Administered 2019-05-16 – 2019-05-17 (×2): 400 mg via ORAL
  Filled 2019-05-16 (×2): qty 1

## 2019-05-16 MED ORDER — IOHEXOL 350 MG/ML SOLN
INTRAVENOUS | Status: DC | PRN
  Administered 2019-05-16 (×2): 15 mL

## 2019-05-16 MED ORDER — ONDANSETRON HCL 4 MG/2ML IJ SOLN: 4.0000 mg | Freq: Four times a day (QID) | INTRAMUSCULAR | Status: DC | PRN

## 2019-05-16 MED ORDER — SODIUM CHLORIDE 0.9 % IV SOLN
INTRAVENOUS | Status: AC
  Filled 2019-05-16: qty 2

## 2019-05-16 MED ORDER — LIDOCAINE HCL (PF) 1 % IJ SOLN
INTRAMUSCULAR | Status: DC | PRN
  Administered 2019-05-16: 70 mL

## 2019-05-16 MED ORDER — HYDROCODONE-ACETAMINOPHEN 5-325 MG PO TABS
1.0000 | ORAL_TABLET | ORAL | Status: DC | PRN
  Administered 2019-05-16: 2 via ORAL
  Filled 2019-05-16: qty 2

## 2019-05-16 MED ORDER — DIGOXIN 125 MCG PO TABS
0.1250 mg | ORAL_TABLET | Freq: Every day | ORAL | Status: DC
  Administered 2019-05-16: 18:00:00 0.125 mg via ORAL
  Filled 2019-05-16 (×2): qty 1

## 2019-05-16 MED ORDER — CEFAZOLIN SODIUM-DEXTROSE 1-4 GM/50ML-% IV SOLN
1.0000 g | Freq: Four times a day (QID) | INTRAVENOUS | Status: AC
  Administered 2019-05-16 – 2019-05-17 (×3): 1 g via INTRAVENOUS
  Filled 2019-05-16 (×3): qty 50

## 2019-05-16 MED ORDER — KETOTIFEN FUMARATE 0.025 % OP SOLN
1.0000 [drp] | Freq: Every day | OPHTHALMIC | Status: DC
  Filled 2019-05-16: qty 5

## 2019-05-16 MED ORDER — SODIUM CHLORIDE 0.9 % IV SOLN: 250.0000 mL | INTRAVENOUS | Status: DC | PRN

## 2019-05-16 MED ORDER — FENTANYL CITRATE (PF) 100 MCG/2ML IJ SOLN
INTRAMUSCULAR | Status: DC | PRN
  Administered 2019-05-16 (×2): 25 ug via INTRAVENOUS

## 2019-05-16 MED ORDER — CEFAZOLIN SODIUM-DEXTROSE 2-4 GM/100ML-% IV SOLN
2.0000 g | INTRAVENOUS | Status: AC
  Administered 2019-05-16: 2 g via INTRAVENOUS
  Filled 2019-05-16: qty 100

## 2019-05-16 MED ORDER — LIDOCAINE HCL (PF) 1 % IJ SOLN
INTRAMUSCULAR | Status: AC
  Filled 2019-05-16: qty 90

## 2019-05-16 MED ORDER — IRBESARTAN 75 MG PO TABS
37.5000 mg | ORAL_TABLET | Freq: Every day | ORAL | Status: DC
  Administered 2019-05-16 – 2019-05-17 (×2): 37.5 mg via ORAL
  Filled 2019-05-16 (×2): qty 1

## 2019-05-16 MED ORDER — MUPIROCIN 2 % EX OINT
TOPICAL_OINTMENT | CUTANEOUS | Status: AC
  Administered 2019-05-16: 1 via TOPICAL
  Filled 2019-05-16: qty 22

## 2019-05-16 MED ORDER — INSULIN ASPART 100 UNIT/ML ~~LOC~~ SOLN: 0.0000 [IU] | Freq: Three times a day (TID) | SUBCUTANEOUS | Status: DC

## 2019-05-16 MED ORDER — SODIUM CHLORIDE 0.9% FLUSH
3.0000 mL | Freq: Two times a day (BID) | INTRAVENOUS | Status: DC
  Administered 2019-05-16: 21:00:00 3 mL via INTRAVENOUS

## 2019-05-16 MED ORDER — SODIUM CHLORIDE 0.9 % IV SOLN
INTRAVENOUS | Status: DC
  Administered 2019-05-16: 12:00:00 via INTRAVENOUS

## 2019-05-16 MED ORDER — CEFAZOLIN SODIUM-DEXTROSE 2-4 GM/100ML-% IV SOLN
INTRAVENOUS | Status: AC
  Filled 2019-05-16: qty 100

## 2019-05-16 MED ORDER — TORSEMIDE 20 MG PO TABS
20.0000 mg | ORAL_TABLET | Freq: Every day | ORAL | Status: DC
  Administered 2019-05-17: 20 mg via ORAL
  Filled 2019-05-16: qty 1

## 2019-05-16 MED ORDER — FENTANYL CITRATE (PF) 100 MCG/2ML IJ SOLN
INTRAMUSCULAR | Status: AC
  Filled 2019-05-16: qty 2

## 2019-05-16 MED ORDER — MIDAZOLAM HCL 5 MG/5ML IJ SOLN
INTRAMUSCULAR | Status: DC | PRN
  Administered 2019-05-16 (×3): 1 mg via INTRAVENOUS

## 2019-05-16 MED ORDER — HEPARIN (PORCINE) IN NACL 1000-0.9 UT/500ML-% IV SOLN
INTRAVENOUS | Status: DC | PRN
  Administered 2019-05-16: 500 mL

## 2019-05-16 MED ORDER — MUPIROCIN 2 % EX OINT
1.0000 "application " | TOPICAL_OINTMENT | Freq: Once | CUTANEOUS | Status: AC
  Administered 2019-05-16: 12:00:00 1 via TOPICAL
  Filled 2019-05-16: qty 22

## 2019-05-16 MED ORDER — CARVEDILOL 25 MG PO TABS
25.0000 mg | ORAL_TABLET | Freq: Two times a day (BID) | ORAL | Status: DC
  Administered 2019-05-16 – 2019-05-17 (×2): 25 mg via ORAL
  Filled 2019-05-16 (×2): qty 1

## 2019-05-16 MED ORDER — MIDAZOLAM HCL 5 MG/5ML IJ SOLN
INTRAMUSCULAR | Status: AC
  Filled 2019-05-16: qty 5

## 2019-05-16 MED ORDER — ACETAMINOPHEN 325 MG PO TABS
325.0000 mg | ORAL_TABLET | ORAL | Status: DC | PRN
  Administered 2019-05-16 – 2019-05-17 (×2): 650 mg via ORAL
  Filled 2019-05-16 (×2): qty 2

## 2019-05-16 SURGICAL SUPPLY — 17 items
ADAPTER SEALING SSA-EW-09 (MISCELLANEOUS) ×3 IMPLANT
CABLE SURGICAL S-101-97-12 (CABLE) ×3 IMPLANT
CATH ATTAIN COM SURV 6250V-MB2 (CATHETERS) ×3 IMPLANT
CATH HEX JOS 2-5-2 65CM 6F REP (CATHETERS) ×3 IMPLANT
ICD GALLANT HFCRTD CDHFA500Q (ICD Generator) ×3 IMPLANT
KIT ESSENTIALS PG (KITS) ×3 IMPLANT
LEAD DURATA 7122Q-65CM (Lead) ×3 IMPLANT
LEAD QUARTET 1458QL-86 (Lead) ×1 IMPLANT
LEAD TENDRIL MRI 52CM LPA1200M (Lead) ×3 IMPLANT
PAD PRO RADIOLUCENT 2001M-C (PAD) ×3 IMPLANT
QUARTET 1458QL-86 (Lead) ×3 IMPLANT
SHEATH 7FR PRELUDE SNAP 13 (SHEATH) ×3 IMPLANT
SHEATH 8FR PRELUDE SNAP 13 (SHEATH) ×3 IMPLANT
SHEATH 9.5FR PRELUDE SNAP 13 (SHEATH) ×3 IMPLANT
SLITTER 6232ADJ (MISCELLANEOUS) ×3 IMPLANT
TRAY PACEMAKER INSERTION (PACKS) ×3 IMPLANT
WIRE ACUITY WHISPER EDS 4648 (WIRE) ×3 IMPLANT

## 2019-05-16 NOTE — Discharge Instructions (Signed)
° ° °  Supplemental Discharge Instructions for  Pacemaker/Defibrillator Patients  Activity No heavy lifting or vigorous activity with your left/right arm for 6 to 8 weeks.  Do not raise your left/right arm above your head for one week.  Gradually raise your affected arm as drawn below.              05/20/2019               05/21/2019              05/22/2019             05/23/2019 __  NO DRIVING for  1 week  ; you may begin driving on  [id number]  .  WOUND CARE - Keep the wound area clean and dry.  Do not get this area wet, no showers until cleared to at your wound check visit . - The tape/steri-strips on your wound will fall off; do not pull them off.  No bandage is needed on the site.  DO  NOT apply any creams, oils, or ointments to the wound area. - If you notice any drainage or discharge from the wound, any swelling or bruising at the site, or you develop a fever > 101? F after you are discharged home, call the office at once.  Special Instructions - You are still able to use cellular telephones; use the ear opposite the side where you have your pacemaker/defibrillator.  Avoid carrying your cellular phone near your device. - When traveling through airports, show security personnel your identification card to avoid being screened in the metal detectors.  Ask the security personnel to use the hand wand. - Avoid arc welding equipment, MRI testing (magnetic resonance imaging), TENS units (transcutaneous nerve stimulators).  Call the office for questions about other devices. - Avoid electrical appliances that are in poor condition or are not properly grounded. - Microwave ovens are safe to be near or to operate.  Additional information for defibrillator patients should your device go off: - If your device goes off ONCE and you feel fine afterward, notify the device clinic nurses. - If your device goes off ONCE and you do not feel well afterward, call 911. - If your device goes off TWICE, call  911. - If your device goes off THREE times in one day, call 911.  DO NOT DRIVE YOURSELF OR A FAMILY MEMBER WITH A DEFIBRILLATOR TO THE HOSPITAL--CALL 911.

## 2019-05-16 NOTE — Progress Notes (Signed)
Orthopedic Tech Progress Note Patient Details:  Brian Bruce 1946-05-07 [id number] RN said patient has sling Patient ID: Brian Bruce, male   DOB: [birth year], 73 y.o.   MRN: [id number]   Brian Bruce 05/16/2019, 6:38 PM

## 2019-05-16 NOTE — Interval H&P Note (Signed)
History and Physical Interval Note:  05/16/2019 12:55 PM  Ernestina Columbia  has presented today for surgery, with the diagnosis of cardiomyopathy - left bundle branch block.  The various methods of treatment have been discussed with the patient and family. After consideration of risks, benefits and other options for treatment, the patient has consented to  Procedure(s): BIV ICD INSERTION CRT-D (N/A) as a surgical intervention.  The patient's history has been reviewed, patient examined, no change in status, stable for surgery.  I have reviewed the patient's chart and labs.  Questions were answered to the patient's satisfaction.    ICD Criteria  Current LVEF:15%. Within 12 months prior to implant: Yes   Heart failure history: Yes, Class III  Cardiomyopathy history: Yes, Ischemic Cardiomyopathy - Prior MI.  Atrial Fibrillation/Atrial Flutter: Yes, Persistent (> 7 Days).  Ventricular tachycardia history: No.  Cardiac arrest history: No.  History of syndromes with risk of sudden death: No.  Previous ICD: No.  Current ICD indication: Primary  PPM indication: Yes,  CRT   Beta Blocker therapy for 3 or more months: Yes, prescribed.   Ace Inhibitor/ARB therapy for 3 or more months: Yes, prescribed.    I have seen SABEN STUP is a 73 y.o. male  Who presents for BiV ICD implant for primary prevention of sudden death.  The patient's chart has been reviewed and they meet criteria for BIVICD implant.  I have had a thorough discussion with the patient reviewing options.  The patient has had opportunities to ask questions and have them answered. The patient and I have decided together through the [place] Support Tool to proceed with BiV ICD at this time.  Risks, benefits, alternatives to BiV ICD implantation were discussed in detail with the patient today. The patient  understands that the risks include but are not limited to bleeding, infection, pneumothorax, perforation,  tamponade, vascular damage, renal failure, MI, stroke, death, inappropriate shocks, and lead dislodgement and wishes to proceed.   He has a very long beard.  I have been very clear with him today about risks of infection associated with this.  He is considering allowing my staff to debulk his beard to reduce his risks of infection.  Thompson Grayer

## 2019-05-16 NOTE — Discharge Summary (Addendum)
ELECTROPHYSIOLOGY PROCEDURE DISCHARGE SUMMARY    Patient ID: Brian Bruce,  MRN: [id number], DOB/AGE: [birth year] 73 y.o.  Admit date: 05/16/2019 Discharge date: 05/17/2019  Primary Care Physician: System, Pcp Not In  Primary Cardiologist: Dr Dot Lanes Electrophysiologist: Dr. Rayann Heman  Primary Discharge Diagnosis:  1. ICM 2. Chronic CHF (systolic) 3. LBBB  Secondary Discharge Diagnosis:  1. CAD     CABG 1998 2. DM 3. Paroxysmal AFib     CHA2DS2Vasc is 5, on warfarin, monitored and managed by his PMD   No Known Allergies   Procedures This Admission:  1.  Implantation of a SJM CRT-D on 05/16/2019 by Dr Rayann Heman.  The patient received a Abbott Dorothy Spark HF model [id number] (serial  Number [id number]) biventricular ICD, [employer] Tendril MRI model LPA1200M-  52 (serial number  [id number]) right atrial lead and a [employer] Dexter, model 7122Q-65 (serial number [id number]) right ventricular defibrillator lead, [place] [phone] (serial number [id number]) lead (CS/LV lead) DFT's were deferred at time of implant.   There were no immediate post procedure complications. 2.  CXR on demonstrated no pneumothorax status post device implantation.    Brief HPI: Brian Bruce is a 73 y.o. male was referred to electrophysiology in the outpatient setting for consideration of ICD implantation.  Past medical history includes above.  The patient has persistent LV dysfunction despite guideline directed therapy.  Risks, benefits, and alternatives to ICD implantation were reviewed with the patient who wished to proceed.   Hospital Course:  The patient was admitted and underwent implantation of a CRT-D with details as outlined above. He was monitored on telemetry overnight which demonstrated AF/V paced.  Left chest was has a small hematoma medial to his site,. Mild ecchymosis laterally towards axilla, no active bleeding.  The device was interrogated and found to be  functioning normally.  CXR was obtained and demonstrated no pneumothorax status post device implantation.  Wound care, arm mobility, and restrictions were reviewed with the patient.  The patient feels well, no CP or SOB, he was examined by Dr. Rayann Heman and considered stable for discharge to home.   INR today is 1.3, warfarin is monitored and managed by his PMD.  He tells me he was told by his PMD to call and make an appointment after resumption of his warfarin post procedure for his INR check, we have asked him to start on Sunday at his usual dosing schedule and have INR check 10 days afterwards  The patient's discharge medications include an ACE/ARB (Candesartan) and beta blocker (carvedilol).   We will plan to visit AAD drug and rhythm control at his wound check visit, Tikosyn vs amiodarone once he is healed and back on a/c.  Dr. Rayann Heman discussed possible TEE/Tikosyn admission vs 3 weeks of INRs and admit, vs  amio as drug option.  Long term thoughts towards PVI ablation if he feels well with maintenance of SR.  We will revisit these options at his visit with me.  Physical Exam: Vitals:   05/16/19 1741 05/16/19 2000 05/16/19 2355 05/17/19 0650  BP: (!) 145/82     Pulse: 80     Resp: 20     Temp:  98.1 F (36.7 C) 98.2 F (36.8 C) 97.9 F (36.6 C)  TempSrc:  Oral Oral Oral  SpO2: 97%     Weight:    80.4 kg  Height:        GEN- The patient is well appearing, alert and  oriented x 3 today.   HEENT: normocephalic, atraumatic; sclera clear, conjunctiva pink; hearing intact; oropharynx clear Lungs- CTA b/l, normal work of breathing.  No wheezes, rales, rhonchi Heart- RRR, no murmurs, rubs or gallops, PMI not laterally displaced GI- soft, non-tender, non-distended Extremities- no clubbing, cyanosis, or edema MS- no significant deformity or atrophy Skin- warm and dry, no rash or lesion, left chest pressure dressing removed without dificulty, has small hematoma medially to site and small area of  ecchymosis laterally, no bleeding Psych- euthymic mood, full affect Neuro- no gross defecits  Labs:   Lab Results  Component Value Date   WBC 5.6 05/13/2019   HGB 12.0 (L) 05/13/2019   HCT 35.0 (L) 05/13/2019   MCV 95.9 05/13/2019   PLT 261 05/13/2019    Recent Labs  Lab 05/17/19 0321  NA 131*  K 4.6  CL 98  CO2 23  BUN 13  CREATININE 0.75  CALCIUM 8.8*  GLUCOSE 139*    Discharge Medications:  Allergies as of 05/17/2019   No Known Allergies     Medication List    TAKE these medications   acetaminophen 500 MG tablet Commonly known as: TYLENOL Take 500 mg by mouth every 6 (six) hours as needed for moderate pain.   Alaway 0.025 % ophthalmic solution Generic drug: ketotifen Place 1 drop into both eyes daily.   aspirin EC 81 MG tablet Take 81 mg by mouth daily.   atorvastatin 80 MG tablet Commonly known as: LIPITOR Take 80 mg by mouth every evening.   augmented betamethasone dipropionate 0.05 % cream Commonly known as: DIPROLENE-AF Apply 1 application topically 2 (two) times daily as needed (rash).   candesartan 16 MG tablet Commonly known as: ATACAND Take 16 mg by mouth daily.   carvedilol 25 MG tablet Commonly known as: COREG Take 25 mg by mouth 2 (two) times daily.   Co Q-10 100 MG Caps Take 100 mg by mouth daily.   digoxin 0.125 MG tablet Commonly known as: LANOXIN Take 0.125 mg by mouth daily.   Fish Oil 1000 MG Caps Take 1,000 mg by mouth daily.   GLUCOSAMINE-CHONDROITIN PO Take 1 tablet by mouth 2 (two) times daily.   magnesium oxide 400 (241.3 Mg) MG tablet Commonly known as: MAG-OX Take 400 mg by mouth 2 (two) times daily.   metFORMIN 1000 MG tablet Commonly known as: GLUCOPHAGE Take 1 tablet (1,000 mg total) by mouth 2 (two) times daily.   spironolactone 50 MG tablet Commonly known as: ALDACTONE Take 25 mg by mouth daily.   torsemide 20 MG tablet Commonly known as: DEMADEX Take 1 tablet (20 mg total) by mouth daily.     Vitamin D 50 MCG (2000 UT) tablet Take 2,000 Units by mouth daily.   warfarin 5 MG tablet Commonly known as: COUMADIN Take 5-7.5 mg by mouth See admin instructions. TAKE 1.5 TABLETS (7.5 MG) BY MOUTH ON MONDAYS THROUGH FRIDAYS IN THE EVENING. TAKE 1 TABLET (5 MG) BY MOUTH ON SATURDAYS & SUNDAYS IN THE EVENING. Notes to patient: Resume your usual dosing regime on Sunday 05/19/2019 and have your INR, lab drawn 10 days afterwards with your primary doctor as discussed        Disposition:  Home  Discharge Instructions    Diet - low sodium heart healthy   Complete by: As directed    Increase activity slowly   Complete by: As directed      Follow-up Information    Baldwin Jamaica, PA-C Follow up.  Specialty: Cardiology Why: 05/28/2019 @ 11:45AM, wound check visit Contact information: [place] [address] [phone]        Thompson Grayer, MD Follow up.   Specialty: Cardiology Why: you will be called by Dr. Jackalyn Lombard scheduler to make a 3 month follow up visit Contact information: [place]  [address] [phone]        primary Doctor Follow up on 05/29/2019.   Why: make an appointment for your coumadin (warfarin) INR lab check 10 days after restarting your warfarin Contact information: [place] [id number] Phone: [phone]           Duration of Discharge Encounter: Greater than 30 minutes including physician time.  SignedTommye Standard, PA-C 05/17/2019 8:52 AM   CXR reveals stable leads, no ptx Device interrogation is reviewed and normal  Thompson Grayer MD, [facility]

## 2019-05-17 ENCOUNTER — Encounter (HOSPITAL_COMMUNITY): Payer: Self-pay

## 2019-05-17 ENCOUNTER — Other Ambulatory Visit: Payer: Self-pay

## 2019-05-17 ENCOUNTER — Ambulatory Visit (HOSPITAL_COMMUNITY): Payer: Medicare Other

## 2019-05-17 DIAGNOSIS — Z7982 Long term (current) use of aspirin: Secondary | ICD-10-CM | POA: Diagnosis not present

## 2019-05-17 DIAGNOSIS — Z006 Encounter for examination for normal comparison and control in clinical research program: Secondary | ICD-10-CM | POA: Diagnosis not present

## 2019-05-17 DIAGNOSIS — I5022 Chronic systolic (congestive) heart failure: Secondary | ICD-10-CM | POA: Diagnosis not present

## 2019-05-17 DIAGNOSIS — Z7984 Long term (current) use of oral hypoglycemic drugs: Secondary | ICD-10-CM | POA: Diagnosis not present

## 2019-05-17 DIAGNOSIS — J45909 Unspecified asthma, uncomplicated: Secondary | ICD-10-CM | POA: Diagnosis not present

## 2019-05-17 DIAGNOSIS — I2510 Atherosclerotic heart disease of native coronary artery without angina pectoris: Secondary | ICD-10-CM | POA: Diagnosis not present

## 2019-05-17 DIAGNOSIS — E119 Type 2 diabetes mellitus without complications: Secondary | ICD-10-CM | POA: Diagnosis not present

## 2019-05-17 DIAGNOSIS — Z7901 Long term (current) use of anticoagulants: Secondary | ICD-10-CM | POA: Diagnosis not present

## 2019-05-17 DIAGNOSIS — Z95810 Presence of automatic (implantable) cardiac defibrillator: Secondary | ICD-10-CM | POA: Diagnosis not present

## 2019-05-17 DIAGNOSIS — Z87891 Personal history of nicotine dependence: Secondary | ICD-10-CM | POA: Diagnosis not present

## 2019-05-17 DIAGNOSIS — I255 Ischemic cardiomyopathy: Secondary | ICD-10-CM | POA: Diagnosis not present

## 2019-05-17 DIAGNOSIS — I447 Left bundle-branch block, unspecified: Secondary | ICD-10-CM | POA: Diagnosis not present

## 2019-05-17 DIAGNOSIS — I4819 Other persistent atrial fibrillation: Secondary | ICD-10-CM | POA: Diagnosis not present

## 2019-05-17 LAB — BASIC METABOLIC PANEL
Anion gap: 10 (ref 5–15)
BUN: 13 mg/dL (ref 8–23)
CO2: 23 mmol/L (ref 22–32)
Calcium: 8.8 mg/dL — ABNORMAL LOW (ref 8.9–10.3)
Chloride: 98 mmol/L (ref 98–111)
Creatinine, Ser: 0.75 mg/dL (ref 0.61–1.24)
GFR calc Af Amer: >60 mL/min (ref >60)
GFR calc non Af Amer: >60 mL/min (ref >60)
Glucose, Bld: 139 mg/dL — ABNORMAL HIGH (ref 70–99)
Potassium: 4.6 mmol/L (ref 3.5–5.1)
Sodium: 131 mmol/L — ABNORMAL LOW (ref 135–145)

## 2019-05-17 LAB — PROTIME-INR
INR: 1.3 — ABNORMAL HIGH (ref 0.8–1.2)
Prothrombin Time: 16 seconds — ABNORMAL HIGH (ref 11.4–15.2)

## 2019-05-25 ENCOUNTER — Telehealth: Payer: Self-pay | Admitting: Medical

## 2019-05-25 NOTE — Telephone Encounter (Signed)
   Patient called the after hours line with complaints of bleeding at recently inserted pacemaker site. He awoke this morning to find a little blood on his shirt. He applied a bandaid and had continued bleeding. Bleeding has since stopped. No complaints of fever, erythema, or pus drainage at the site. He is on coumadin. Suspect a bit of the scab came off overnight and he had prolonged bleeding due to coumadin. Not copious amounts and has since stopped so will manage conservatively at this time. Recommended applying pressure to the site for several minutes if it reoccurs. He is scheduled to see Tommye Standard 05/28/2019 for post PPM follow-up, at which time his site can be checked. Patient was appreciative of the call and in agreement with he plan.   Abigail Butts, PA-C 05/25/19; 9:15 AM

## 2019-05-26 LAB — CUP PACEART REMOTE DEVICE CHECK
Date Time Interrogation Session: 20201025143142
Implantable Lead Implant Date: 20201015
Implantable Lead Implant Date: 20201015
Implantable Lead Implant Date: 20201015
Implantable Lead Location: 753858
Implantable Lead Location: 753859
Implantable Lead Location: 753860
Implantable Pulse Generator Implant Date: 20201015
Pulse Gen Serial Number: 111006847

## 2019-05-26 NOTE — Progress Notes (Signed)
Cardiology Office Note Date:  05/28/2019  Patient ID:  Nirav, Semmler 06/09/1946, MRN [id number] PCP:  System, Pcp Not In  Cardiologist:  Dr Dot Lanes Electrophysiologist: Dr. Rayann Heman    Chief Complaint:  wound check visit  History of Present Illness: ONUR DEJA is a 73 y.o. male with history of CAD (CABG 1998), DM, paroxysmal Afib, ICM, chronic CHF (systolic), LBBB.  He had done well for many years post CABG until 2020 with progressive SOB, volume OL, and found to have new reduced LVEF of 15%  He comes today to be seen for Dr. Rayann Heman, now s/p CRT-D implant on 05/15/2019. At the time of his discharge Dr. Rayann Heman discussed eventual AAD for his Afib, Tikosyn vs amiodarone once back on uninterrupted a/c, therapeutic INRs.. Briefly discussed TEE/Tikosyn admisioin vs 3 weeks of INRs then start drug either Tikosyn or amiodarone.  If able to gain and maintain SR with clear improvement in symptoms with SR, long term thoughts towards PVI ablation   He had some oozing from his site a few days after his implant.  He put a bandage over top the steri strips as directed by the person he spoke to when he called in.  He thinks he had some minior oozing on/off for a couple days.  None if a few. Otherwise, he feels the swelling has improved and hematoma slightly smaller now, certainly has not gotten bigger bu his observation from discharge.  He denies any CP, palpitations or SOB. His BP and vitals stable at home His weight at home steady at 174 by his scale. No dizziness, near syncope or syncope Reports he has followed his activity restrictions.  He is due for INR Friday with his PMD  Device information SJM CRT-D, implanted 05/15/2019, primary prevention  Past Medical History:  Diagnosis Date  . Asthma   . CHF (congestive heart failure) ([place])   . Coronary artery disease   . Diabetes mellitus without complication ([place])   . Ischemic cardiomyopathy   . LBBB (left bundle branch block)   .  Persistent atrial fibrillation [facility])     Past Surgical History:  Procedure Laterality Date  . BIV ICD INSERTION CRT-D N/A 05/16/2019   Procedure: BIV ICD INSERTION CRT-D;  Surgeon: Thompson Grayer, MD;  Location: [facility];  Service: Cardiovascular;  Laterality: N/A;  . CORONARY ARTERY BYPASS GRAFT  1998   4-vessel  . RIGHT/LEFT HEART CATH AND CORONARY/GRAFT ANGIOGRAPHY N/A 03/07/2019   Procedure: RIGHT/LEFT HEART CATH AND CORONARY/GRAFT ANGIOGRAPHY;  Surgeon: Nelva Bush, MD;  Location: [facility];  Service: Cardiovascular;  Laterality: N/A;    Current Outpatient Medications  Medication Sig Dispense Refill  . acetaminophen (TYLENOL) 500 MG tablet Take 500 mg by mouth every 6 (six) hours as needed for moderate pain.    Marland Kitchen aspirin EC 81 MG tablet Take 81 mg by mouth daily.    Marland Kitchen atorvastatin (LIPITOR) 80 MG tablet Take 80 mg by mouth every evening.    Marland Kitchen augmented betamethasone dipropionate (DIPROLENE-AF) 0.05 % cream Apply 1 application topically 2 (two) times daily as needed (rash).    . candesartan (ATACAND) 16 MG tablet Take 16 mg by mouth daily.    . carvedilol (COREG) 25 MG tablet Take 25 mg by mouth 2 (two) times daily.     . Cholecalciferol (VITAMIN D) 50 MCG (2000 UT) tablet Take 2,000 Units by mouth daily.    . Coenzyme Q10 (CO Q-10) 100 MG CAPS Take 100 mg by  mouth daily.    . digoxin (LANOXIN) 0.125 MG tablet Take 0.125 mg by mouth daily.    Marland Kitchen GLUCOSAMINE-CHONDROITIN PO Take 1 tablet by mouth 2 (two) times daily.    Marland Kitchen ketotifen (ALAWAY) 0.025 % ophthalmic solution Place 1 drop into both eyes daily.    . magnesium oxide (MAG-OX) 400 (241.3 Mg) MG tablet Take 400 mg by mouth 2 (two) times daily.     . metFORMIN (GLUCOPHAGE) 1000 MG tablet Take 1 tablet (1,000 mg total) by mouth 2 (two) times daily.    . Omega-3 Fatty Acids (FISH OIL) 1000 MG CAPS Take 1,000 mg by mouth daily.    Marland Kitchen spironolactone (ALDACTONE) 50 MG tablet Take 25 mg by mouth daily.    Marland Kitchen torsemide  (DEMADEX) 20 MG tablet Take 1 tablet (20 mg total) by mouth daily.    Marland Kitchen warfarin (COUMADIN) 5 MG tablet Take 5-7.5 mg by mouth See admin instructions. TAKE 1.5 TABLETS (7.5 MG) BY MOUTH ON MONDAYS THROUGH FRIDAYS IN THE EVENING. TAKE 1 TABLET (5 MG) BY MOUTH ON SATURDAYS & SUNDAYS IN THE EVENING.     No current facility-administered medications for this visit.     Allergies:   Patient has no known allergies.   Social History:  The patient  reports that he quit smoking about 40 years ago. His smoking use included cigarettes. He has never used smokeless tobacco. He reports current alcohol use. He reports that he does not use drugs.   Family History:  The patient's family history includes Cancer in his mother; Heart failure in his father.  ROS:  Please see the history of present illness.    All other systems are reviewed and otherwise negative.   PHYSICAL EXAM:  VS:  BP 130/84   Pulse 80   Ht 5\' 9"  (1.753 m)   Wt 181 lb (82.1 kg)   SpO2 99%   BMI 26.73 kg/m  BMI: Body mass index is 26.73 kg/m. Well nourished, well developed, in no acute distress  HEENT: normocephalic, atraumatic  Neck: no JVD, carotid bruits or masses Cardiac:  RRR; no significant murmurs, no rubs, or gallops Lungs:  CTA b/l, no wheezing, rhonchi or rales  Abd: soft, nontender MS: no deformity, age appropriate atrophy Ext:  no edema  Skin: warm and dry, no rash Neuro:  No gross deficits appreciated Psych: euthymic mood, full affect  ICD site is stable, hematoma noted, about the same, perhaps slightly smaller then time of discharge.  Soft, nontender, ecchymosis noted, later stages/color steri strips with dried blood to lateral edge of dressing, removed without difficulty, no active bleeding, no erythema, skin edges are approximated, I do not see any open areas.   EKG:  Not done today ICD interrogation done today and reviewed by myself: Battery, RA, RV lead measurements are good LV lead impedance is good, though  not capturing at current settings LV lead vectors are all checked Best capture was 1-2 and 1-4, he had PNS with both that resolved at 2.5V and both had LOC at 0.7V/0.83ms LV-RV conduction time acceptable in all poles LV was re-programmed to 1-2, 1.5V/0.1ms RA/RV lead outputs/settings unchanged, remain at acute implant settings     03/07/2019: [facility] Conclusions: 1. Two-vessel CAD with chronic total occlusion of the proximal/mid LAD, 60% mid RCA stenosis, and chronic total occlusion of the proximal rPDA. 2. Widely patent LIMA jump graft to diagonal and LAD. 3. Chronically occluded SVG-RCA (unsure if single conduit or jump graft). 4. Widely patent, ungrafted RIMA.  5. Normal left and right heart filling pressures. 6. Normal Fick cardiac output/index.  Recommendations: 1. Continue medical therapy and secondary prevention. 2. If no bleeding from catheterization sites, okay to begin warfarin this evening.    TTE (11/23/2018):  Moderately dilated left ventricle with severely reduced LVEF.  Moderately reduced RV with moderately reduced contraction.  No significant pericardial effusion.  Moderate to severe mitral regurgitation.   Recent Labs: 05/13/2019: Hemoglobin 12.0; Platelets 261 05/17/2019: BUN 13; Creatinine, Ser 0.75; Potassium 4.6; Sodium 131  No results found for requested labs within last 8760 hours.   Estimated Creatinine Clearance: 84.7 mL/min (by C-G formula based on SCr of 0.75 mg/dL).   Wt Readings from Last 3 Encounters:  05/28/19 181 lb (82.1 kg)  05/17/19 177 lb 3.2 oz (80.4 kg)  05/01/19 174 lb (78.9 kg)     Other studies reviewed: Additional studies/records reviewed today include: summarized above  ASSESSMENT AND PLAN:   1. CRT-D     He has a hematoma, though in the patient's observation slowly getting smaller, nontender, soft. No active bleeding, skins edges are healed.     shock plan reviewed     remaining restrictions reviewed  LV lead discussion as above  Will have him go for a CXR  2. ICM 3. Chronic CHF (systolic)     No symptoms or exam findings of volume OL     C/w his attending cardiologist  4. Persistent Afib     CHA2DS2Vasc is 4, on warfarin, monitored and manged by his PMD     INR today given hematoma, would like to have hematoma near resolution prior to discussion of AAD to make sure we will not need to interrupt his warfarin      Hematoma is not enlarging, pt feels is getting smaller      I will see him next week, revisit AAD/rhythm control strategy at that time.        Disposition: as above  Current medicines are reviewed at length with the patient today.  The patient did not have any concerns regarding medicines.  Venetia Night, PA-C 05/28/2019 12:36 PM     [place] [address] [phone] (office)  [phone] (fax)

## 2019-05-28 ENCOUNTER — Ambulatory Visit (INDEPENDENT_AMBULATORY_CARE_PROVIDER_SITE_OTHER): Payer: Medicare Other | Admitting: Physician Assistant

## 2019-05-28 ENCOUNTER — Ambulatory Visit
Admission: RE | Admit: 2019-05-28 | Discharge: 2019-05-28 | Disposition: A | Discharge status: Home / Self Care | Payer: Medicare Other | Source: Ambulatory Visit | Attending: Physician Assistant | Admitting: Physician Assistant

## 2019-05-28 ENCOUNTER — Encounter: Payer: Self-pay | Admitting: Physician Assistant

## 2019-05-28 ENCOUNTER — Ambulatory Visit: Payer: Medicare Other

## 2019-05-28 ENCOUNTER — Other Ambulatory Visit: Payer: Self-pay

## 2019-05-28 VITALS — BP 130/84 | HR 80 | Ht 69.0 in | Wt 181.0 lb

## 2019-05-28 DIAGNOSIS — I5022 Chronic systolic (congestive) heart failure: Secondary | ICD-10-CM

## 2019-05-28 DIAGNOSIS — I255 Ischemic cardiomyopathy: Secondary | ICD-10-CM | POA: Diagnosis not present

## 2019-05-28 DIAGNOSIS — I4819 Other persistent atrial fibrillation: Secondary | ICD-10-CM | POA: Diagnosis not present

## 2019-05-28 DIAGNOSIS — Z95810 Presence of automatic (implantable) cardiac defibrillator: Secondary | ICD-10-CM

## 2019-05-28 DIAGNOSIS — I509 Heart failure, unspecified: Secondary | ICD-10-CM | POA: Diagnosis not present

## 2019-05-28 DIAGNOSIS — I519 Heart disease, unspecified: Secondary | ICD-10-CM

## 2019-05-28 LAB — PROTIME-INR
INR: 1.4 — ABNORMAL HIGH (ref 0.9–1.2)
Prothrombin Time: 14.9 s — ABNORMAL HIGH (ref 9.1–12.0)

## 2019-05-28 NOTE — Patient Instructions (Addendum)
Medication Instructions:  Your physician recommends that you continue on your current medications as directed. Please refer to the Current Medication list given to you today.  *If you need a refill on your cardiac medications before your next appointment, please call your pharmacy*  Lab Work: TODAY:PRO-TIME INR If you have labs (blood work) drawn today and your tests are completely normal, you will receive your results only by: Marland Kitchen MyChart Message (if you have MyChart) OR . A paper copy in the mail If you have any lab test that is abnormal or we need to change your treatment, we will call you to review the results.  Testing/Procedures: A chest x-ray takes a picture of the organs and structures inside the chest, including the heart, lungs, and blood vessels. This test can show several things, including, whether the heart is enlarges; whether fluid is building up in the lungs; and whether pacemaker / defibrillator leads are still in place. TO BE DONE AT [place]. ADDRESS [address]   Follow-Up: At [facility], you and your health needs are our priority.  As part of our continuing mission to provide you with exceptional heart care, we have created designated Provider Care Teams.  These Care Teams include your primary Cardiologist (physician) and Advanced Practice Providers (APPs -  Physician Assistants and Nurse Practitioners) who all work together to provide you with the care you need, when you need it.  Your next appointment:   1 WEEK  The format for your next appointment:   In Person  Provider:   Tommye Standard, PA-C

## 2019-05-31 DIAGNOSIS — Z7901 Long term (current) use of anticoagulants: Secondary | ICD-10-CM | POA: Diagnosis not present

## 2019-06-02 NOTE — Progress Notes (Addendum)
Cardiology Office Note Date:  06/04/2019  Patient ID:  Brian Bruce, Brian Bruce 16-Dec-1945, MRN [id number] PCP:  System, Pcp Not In  Cardiologist:  Dr Dot Lanes Electrophysiologist: Dr. Rayann Heman    Chief Complaint:  Wound/hematoma check visit  History of Present Illness: Brian Bruce is a 73 y.o. male with history of CAD (CABG 1998), DM, paroxysmal Afib, ICM, chronic CHF (systolic), LBBB.  He had done well for many years post CABG until 2020 with progressive SOB, volume OL, and found to have new reduced LVEF of 15%  I saw him last week for Dr. Rayann Heman, now s/p CRT-D implant on 05/15/2019. At the time of his discharge Dr. Rayann Heman discussed eventual AAD for his Afib, Tikosyn vs amiodarone once back on uninterrupted a/c, therapeutic INRs.. Briefly discussed TEE/Tikosyn admisioin vs 3 weeks of INRs then start drug either Tikosyn or amiodarone.  If able to gain and maintain SR with clear improvement in symptoms with SR, long term thoughts towards PVI ablation   He had a hematoma, the pt reported in his observation was if anything smaller and improving.  He had loss of LV capture and with SJM rep, testing of all vectors, reprogrammed (Dr. Rayann Heman made aware). We did not go to far into AAD discussions, planning to observe site for another week, and ensure no need to withhold his a/c prior to rhythm control strategies were initiated. CXR was done, minimal if any LV lead movement from post-op Day 1 noted, INR done was subtherapeutic at 1.4  He feels well, thinks his site is about the same size, though the bruising/color better, no bleeding.  No CP, palpitations or SOB, no DOE, no dizziness, near syncope or syncope, no shocks  He reports his INR Friday at his PMD office still only 1.7, his warfarin was increased, scheduled for INR 17th. No bleeding or signs of bleeding  Device information SJM CRT-D, implanted 05/15/2019, primary prevention  Past Medical History:  Diagnosis Date  . Asthma   . CHF  (congestive heart failure) ([place])   . Coronary artery disease   . Diabetes mellitus without complication ([place])   . Ischemic cardiomyopathy   . LBBB (left bundle branch block)   . Persistent atrial fibrillation [facility])     Past Surgical History:  Procedure Laterality Date  . BIV ICD INSERTION CRT-D N/A 05/16/2019   Procedure: BIV ICD INSERTION CRT-D;  Surgeon: Thompson Grayer, MD;  Location: [place] [facility];  Service: Cardiovascular;  Laterality: N/A;  . CORONARY ARTERY BYPASS GRAFT  1998   4-vessel  . RIGHT/LEFT HEART CATH AND CORONARY/GRAFT ANGIOGRAPHY N/A 03/07/2019   Procedure: RIGHT/LEFT HEART CATH AND CORONARY/GRAFT ANGIOGRAPHY;  Surgeon: Nelva Bush, MD;  Location: [place] [facility];  Service: Cardiovascular;  Laterality: N/A;    Current Outpatient Medications  Medication Sig Dispense Refill  . acetaminophen (TYLENOL) 500 MG tablet Take 500 mg by mouth every 6 (six) hours as needed for moderate pain.    Marland Kitchen aspirin EC 81 MG tablet Take 81 mg by mouth daily.    Marland Kitchen atorvastatin (LIPITOR) 80 MG tablet Take 80 mg by mouth every evening.    Marland Kitchen augmented betamethasone dipropionate (DIPROLENE-AF) 0.05 % cream Apply 1 application topically 2 (two) times daily as needed (rash).    . candesartan (ATACAND) 16 MG tablet Take 16 mg by mouth daily.    . carvedilol (COREG) 25 MG tablet Take 25 mg by mouth 2 (two) times daily.     . Cholecalciferol (VITAMIN D) 50 MCG (  2000 UT) tablet Take 2,000 Units by mouth daily.    . Coenzyme Q10 (CO Q-10) 100 MG CAPS Take 100 mg by mouth daily.    . digoxin (LANOXIN) 0.125 MG tablet Take 0.125 mg by mouth daily.    Marland Kitchen GLUCOSAMINE-CHONDROITIN PO Take 1 tablet by mouth 2 (two) times daily.    Marland Kitchen ketotifen (ALAWAY) 0.025 % ophthalmic solution Place 1 drop into both eyes daily.    . magnesium oxide (MAG-OX) 400 (241.3 Mg) MG tablet Take 400 mg by mouth 2 (two) times daily.     . metFORMIN (GLUCOPHAGE) 1000 MG tablet Take 1 tablet (1,000 mg total) by mouth 2 (two)  times daily.    . Omega-3 Fatty Acids (FISH OIL) 1000 MG CAPS Take 1,000 mg by mouth daily.    Marland Kitchen spironolactone (ALDACTONE) 50 MG tablet Take 25 mg by mouth daily.    Marland Kitchen torsemide (DEMADEX) 20 MG tablet Take 1 tablet (20 mg total) by mouth daily.    Marland Kitchen warfarin (COUMADIN) 5 MG tablet Take 5-7.5 mg by mouth See admin instructions. TAKE 1.5 TABLETS (7.5 MG) BY MOUTH     No current facility-administered medications for this visit.     Allergies:   Patient has no known allergies.   Social History:  The patient  reports that he quit smoking about 40 years ago. His smoking use included cigarettes. He has never used smokeless tobacco. He reports current alcohol use. He reports that he does not use drugs.   Family History:  The patient's family history includes Cancer in his mother; Heart failure in his father.  ROS:  Please see the history of present illness.    All other systems are reviewed and otherwise negative.   PHYSICAL EXAM:  VS:  BP (!) 148/78   Pulse 80   Ht 5\' 9"  (1.753 m)   Wt 175 lb (79.4 kg)   BMI 25.84 kg/m  BMI: Body mass index is 25.84 kg/m. Well nourished, well developed, in no acute distress  HEENT: normocephalic, atraumatic  Neck: no JVD, carotid bruits or masses Cardiac:  RRR; no significant murmurs, no rubs, or gallops Lungs:  CTA b/l, no wheezing, rhonchi or rales  Abd: soft, nontender MS: no deformity, age appropriate atrophy Ext:  no edema  Skin: warm and dry, no rash Neuro:  No gross deficits appreciated Psych: euthymic mood, full affect  ICD site is stable, skin edges are well approximated/healed.  Hematoma is much smaller, nearly resolved, ecchymosis is in varius stages.  No erythema, no increased heat to surroundings tissues, no fluctuation, no tethering   EKG:  Not done today ICD interrogation done today and reviewed by myself: Battery, lead measurements are stable 94%BP 100% AFib RA/RV lead outputs/settings unchanged, remain at acute implant  settings     03/07/2019: [facility] Conclusions: 1. Two-vessel CAD with chronic total occlusion of the proximal/mid LAD, 60% mid RCA stenosis, and chronic total occlusion of the proximal rPDA. 2. Widely patent LIMA jump graft to diagonal and LAD. 3. Chronically occluded SVG-RCA (unsure if single conduit or jump graft). 4. Widely patent, ungrafted RIMA. 5. Normal left and right heart filling pressures. 6. Normal Fick cardiac output/index.  Recommendations: 1. Continue medical therapy and secondary prevention. 2. If no bleeding from catheterization sites, okay to begin warfarin this evening.    TTE (11/23/2018):  Moderately dilated left ventricle with severely reduced LVEF.  Moderately reduced RV with moderately reduced contraction.  No significant pericardial effusion.  Moderate to severe mitral regurgitation.  Recent Labs: 05/13/2019: Hemoglobin 12.0; Platelets 261 05/17/2019: BUN 13; Creatinine, Ser 0.75; Potassium 4.6; Sodium 131  No results found for requested labs within last 8760 hours.   Estimated Creatinine Clearance: 84.7 mL/min (by C-G formula based on SCr of 0.75 mg/dL).   Wt Readings from Last 3 Encounters:  06/04/19 175 lb (79.4 kg)  05/28/19 181 lb (82.1 kg)  05/17/19 177 lb 3.2 oz (80.4 kg)     Other studies reviewed: Additional studies/records reviewed today include: summarized above  ASSESSMENT AND PLAN:   1. CRT-D     Site looks better, hematoma much smaller, nearly resolved.  What he interpreted as swelling is mostly the device     LV lead threshold is stable from last week     No programming changes    2. ICM 3. Chronic CHF (systolic)     No symptoms or exam findings of volume OL     Home weights are stable     On BB, ARB, diuretics     C/w his attending cardiologist  4. Persistent Afib     CHA2DS2Vasc is 4, on warfarin, monitored and manged by his PMD      We discussed rational for trying to resume SR and strategies to do so Discussed Tikosyn,  need for hospital stay, potential benefits and risks of drug  - I do not appreciate any contraindicated medicines - QT given paced QRS and ICD is acceptable  Discussed amiodarone, potential benefits as well as risks including pulmonary hepatic, thyroid toxicity Discussed likely need for DCCV with both Discussed need for 3 weeks of consistent therapeutic INR's ahead of plans to convert  He would like to pursue Tikosyn though would like to do in-between Thanksgiving/Christmas. I will have him see the AFib clinic late Nov/1st week Dec to get him lined up     He will ask his PMD tos start weekly INR's after his 17th lab and forward the results to [place].      5. CAD     No ischemic symptoms     On ASA, BB, statin     C/w his primary cardiologist   Disposition: As above, Dr. Ky Barban in [place] as scheduled, sooner if needed  Current medicines are reviewed at length with the patient today.  The patient did not have any concerns regarding medicines.  Venetia Night, PA-C 06/04/2019 12:46 PM     [place] [place] [address] [phone] (office)  [phone] (fax)

## 2019-06-04 ENCOUNTER — Telehealth: Payer: Self-pay

## 2019-06-04 ENCOUNTER — Ambulatory Visit (INDEPENDENT_AMBULATORY_CARE_PROVIDER_SITE_OTHER): Payer: Medicare Other | Admitting: Physician Assistant

## 2019-06-04 ENCOUNTER — Other Ambulatory Visit: Payer: Self-pay

## 2019-06-04 VITALS — BP 148/78 | HR 80 | Ht 69.0 in | Wt 175.0 lb

## 2019-06-04 DIAGNOSIS — Z5189 Encounter for other specified aftercare: Secondary | ICD-10-CM

## 2019-06-04 DIAGNOSIS — I2510 Atherosclerotic heart disease of native coronary artery without angina pectoris: Secondary | ICD-10-CM | POA: Diagnosis not present

## 2019-06-04 DIAGNOSIS — Z95810 Presence of automatic (implantable) cardiac defibrillator: Secondary | ICD-10-CM | POA: Diagnosis not present

## 2019-06-04 DIAGNOSIS — I519 Heart disease, unspecified: Secondary | ICD-10-CM

## 2019-06-04 DIAGNOSIS — I4819 Other persistent atrial fibrillation: Secondary | ICD-10-CM

## 2019-06-04 DIAGNOSIS — I255 Ischemic cardiomyopathy: Secondary | ICD-10-CM

## 2019-06-04 DIAGNOSIS — I5022 Chronic systolic (congestive) heart failure: Secondary | ICD-10-CM

## 2019-06-04 NOTE — Patient Instructions (Addendum)
Medication Instructions:  Your physician recommends that you continue on your current medications as directed. Please refer to the Current Medication list given to you today.  *If you need a refill on your cardiac medications before your next appointment, please call your pharmacy*  Lab Work: [place]   If you have labs (blood work) drawn today and your tests are completely normal, you will receive your results only by: Marland Kitchen MyChart Message (if you have MyChart) OR . A paper copy in the mail If you have any lab test that is abnormal or we need to change your treatment, we will call you to review the results.  Testing/Procedures: NONE ORDERED  TODAY    Follow-Up: At [facility], you and your health needs are our priority.  As part of our continuing mission to provide you with exceptional heart care, we have created designated Provider Care Teams.  These Care Teams include your primary Cardiologist (physician) and Advanced Practice Providers (APPs -  Physician Assistants and Nurse Practitioners) who all work together to provide you with the care you need, when you need it.  Your next appointment:   IN ONE MONTH   The format for your next appointment:   In Person  Provider:   AFIB CLINIC FOR [facility] LOAD   Other Instructions  WITH PCP THE LAST WEEK OF November TO START WEEKLY INR CHECKS AD SEND TO OFFICE  [id number] ATTN RENEE URUSY

## 2019-06-04 NOTE — Telephone Encounter (Signed)
Medication list reviewed in anticipation of upcoming Tikosyn initiation. Patient is taking concomitant QTc prolonging medications: torsemide 20 mg daily  Patient is anticoagulated on warfarin but last 2 INR readings on 10/16 and 10/27 have been subtherapeutic following ICD implant on 05/16/19. INR goal 2-3. Noted not a candidate for Eliquis due to cost. Patient will require 4 weekly therapeutic INR prior to Tikosyn initiation. We do not manage patient's warfarin therapy. This will need to be coordinated by patient's managing physician.   Last BMET on 05/17/19 with potassium 4.6 (goal >4). No recent magnesium labs. Recommend obtaining BMET with potassium prior to initiating Tikosyn with goal K > 4 and Mag > 2.  Patient will need to be counseled to avoid use of Benadryl while on Tikosyn and in the 2-3 days prior to Tikosyn initiation.

## 2019-06-18 DIAGNOSIS — Z7901 Long term (current) use of anticoagulants: Secondary | ICD-10-CM | POA: Diagnosis not present

## 2019-07-01 ENCOUNTER — Ambulatory Visit (HOSPITAL_COMMUNITY)
Admission: RE | Admit: 2019-07-01 | Discharge: 2019-07-01 | Disposition: A | Discharge status: Home / Self Care | Payer: Medicare Other | Source: Ambulatory Visit | Attending: Physician Assistant | Admitting: Physician Assistant

## 2019-07-01 ENCOUNTER — Encounter (HOSPITAL_COMMUNITY): Payer: Self-pay | Admitting: Physician Assistant

## 2019-07-01 ENCOUNTER — Other Ambulatory Visit: Payer: Self-pay

## 2019-07-01 VITALS — BP 150/72 | HR 80 | Ht 69.0 in | Wt 185.6 lb

## 2019-07-01 DIAGNOSIS — J45909 Unspecified asthma, uncomplicated: Secondary | ICD-10-CM | POA: Diagnosis not present

## 2019-07-01 DIAGNOSIS — I255 Ischemic cardiomyopathy: Secondary | ICD-10-CM | POA: Diagnosis not present

## 2019-07-01 DIAGNOSIS — I2510 Atherosclerotic heart disease of native coronary artery without angina pectoris: Secondary | ICD-10-CM | POA: Insufficient documentation

## 2019-07-01 DIAGNOSIS — Z8249 Family history of ischemic heart disease and other diseases of the circulatory system: Secondary | ICD-10-CM | POA: Insufficient documentation

## 2019-07-01 DIAGNOSIS — I447 Left bundle-branch block, unspecified: Secondary | ICD-10-CM | POA: Diagnosis not present

## 2019-07-01 DIAGNOSIS — Z87891 Personal history of nicotine dependence: Secondary | ICD-10-CM | POA: Diagnosis not present

## 2019-07-01 DIAGNOSIS — E119 Type 2 diabetes mellitus without complications: Secondary | ICD-10-CM | POA: Insufficient documentation

## 2019-07-01 DIAGNOSIS — Z95810 Presence of automatic (implantable) cardiac defibrillator: Secondary | ICD-10-CM | POA: Diagnosis not present

## 2019-07-01 DIAGNOSIS — Z79899 Other long term (current) drug therapy: Secondary | ICD-10-CM | POA: Insufficient documentation

## 2019-07-01 DIAGNOSIS — Z7982 Long term (current) use of aspirin: Secondary | ICD-10-CM | POA: Diagnosis not present

## 2019-07-01 DIAGNOSIS — Z7984 Long term (current) use of oral hypoglycemic drugs: Secondary | ICD-10-CM | POA: Diagnosis not present

## 2019-07-01 DIAGNOSIS — I4819 Other persistent atrial fibrillation: Secondary | ICD-10-CM | POA: Diagnosis not present

## 2019-07-01 DIAGNOSIS — I5022 Chronic systolic (congestive) heart failure: Secondary | ICD-10-CM | POA: Diagnosis not present

## 2019-07-01 DIAGNOSIS — D6869 Other thrombophilia: Secondary | ICD-10-CM | POA: Diagnosis not present

## 2019-07-01 DIAGNOSIS — Z951 Presence of aortocoronary bypass graft: Secondary | ICD-10-CM | POA: Insufficient documentation

## 2019-07-01 DIAGNOSIS — Z7901 Long term (current) use of anticoagulants: Secondary | ICD-10-CM | POA: Insufficient documentation

## 2019-07-01 NOTE — Progress Notes (Signed)
Primary Care Physician: Dr Stoney Bang Primary Cardiologist: Dr Dot Lanes Primary Electrophysiologist: Dr Rayann Heman Referring Physician: Tommye Standard PA   Brian Bruce is a 73 y.o. male with a history of CAD (CABG 1998), DM, persistent Afib, ICM s/p ICD, chronic CHF (systolic), LBBB who presents for follow up in the [facility]. Patient seen in EP clinic post ICD implant with persistent afib. He is on warfarin for a CHADS2VASC score of 4. Patient would like to pursue Tikosyn. He is fairly unaware of his arrhythmia with no increased SOB, fatigue, palpitations, or heart racing. He denies significant snoring or alcohol use.  Today, he denies symptoms of palpitations, chest pain, shortness of breath, orthopnea, PND, lower extremity edema, dizziness, presyncope, syncope, snoring, daytime somnolence, bleeding, or neurologic sequela. The patient is tolerating medications without difficulties and is otherwise without complaint today.    Atrial Fibrillation Risk Factors:  he does not have symptoms or diagnosis of sleep apnea. he does not have a history of rheumatic fever.   he has a BMI of Body mass index is 27.41 kg/m.Marland Kitchen Filed Weights   07/01/19 1057  Weight: 84.2 kg    Family History  Problem Relation Age of Onset  . Cancer Mother   . Heart failure Father      Atrial Fibrillation Management history:  Previous antiarrhythmic drugs: one Previous cardioversions: none Previous ablations: none CHADS2VASC score: 4 Anticoagulation history: warfarin    Past Medical History:  Diagnosis Date  . Asthma   . CHF (congestive heart failure) ([place])   . Coronary artery disease   . Diabetes mellitus without complication ([place])   . Ischemic cardiomyopathy   . LBBB (left bundle branch block)   . Persistent atrial fibrillation [facility])    Past Surgical History:  Procedure Laterality Date  . BIV ICD INSERTION CRT-D N/A 05/16/2019   Procedure: BIV ICD INSERTION  CRT-D;  Surgeon: Thompson Grayer, MD;  Location: [place] [facility];  Service: Cardiovascular;  Laterality: N/A;  . CORONARY ARTERY BYPASS GRAFT  1998   4-vessel  . RIGHT/LEFT HEART CATH AND CORONARY/GRAFT ANGIOGRAPHY N/A 03/07/2019   Procedure: RIGHT/LEFT HEART CATH AND CORONARY/GRAFT ANGIOGRAPHY;  Surgeon: Nelva Bush, MD;  Location: [place] [facility];  Service: Cardiovascular;  Laterality: N/A;    Current Outpatient Medications  Medication Sig Dispense Refill  . acetaminophen (TYLENOL) 500 MG tablet Take 500 mg by mouth every 6 (six) hours as needed for moderate pain.    Marland Kitchen aspirin EC 81 MG tablet Take 81 mg by mouth daily.    Marland Kitchen atorvastatin (LIPITOR) 80 MG tablet Take 80 mg by mouth every evening.    Marland Kitchen augmented betamethasone dipropionate (DIPROLENE-AF) 0.05 % cream Apply 1 application topically 2 (two) times daily as needed (rash).    . candesartan (ATACAND) 16 MG tablet Take 16 mg by mouth daily.    . carvedilol (COREG) 25 MG tablet Take 25 mg by mouth 2 (two) times daily.     . Cholecalciferol (VITAMIN D) 50 MCG (2000 UT) tablet Take 2,000 Units by mouth daily.    . Coenzyme Q10 (CO Q-10) 100 MG CAPS Take 100 mg by mouth daily.    . digoxin (LANOXIN) 0.125 MG tablet Take 0.125 mg by mouth daily.    Marland Kitchen GLUCOSAMINE-CHONDROITIN PO Take 1 tablet by mouth 2 (two) times daily.    Marland Kitchen ketotifen (ALAWAY) 0.025 % ophthalmic solution Place 1 drop into both eyes daily.    . magnesium oxide (MAG-OX) 400 (  241.3 Mg) MG tablet Take 400 mg by mouth 2 (two) times daily.     . metFORMIN (GLUCOPHAGE) 1000 MG tablet Take 1 tablet (1,000 mg total) by mouth 2 (two) times daily.    . Omega-3 Fatty Acids (FISH OIL) 1000 MG CAPS Take 1,000 mg by mouth daily.    Marland Kitchen spironolactone (ALDACTONE) 50 MG tablet Take 25 mg by mouth daily.    Marland Kitchen torsemide (DEMADEX) 20 MG tablet Take 1 tablet (20 mg total) by mouth daily.    Marland Kitchen warfarin (COUMADIN) 5 MG tablet Take 5-7.5 mg by mouth See admin instructions. TAKE 1.5 TABLETS  (7.5 MG) BY MOUTH     No current facility-administered medications for this encounter.     No Known Allergies  Social History   Socioeconomic History  . Marital status: Married    Spouse name: Not on file  . Number of children: Not on file  . Years of education: Not on file  . Highest education level: Not on file  Occupational History  . Not on file  Social Needs  . Financial resource strain: Not on file  . Food insecurity    Worry: Not on file    Inability: Not on file  . Transportation needs    Medical: Not on file    Non-medical: Not on file  Tobacco Use  . Smoking status: Former Smoker    Types: Cigarettes    Quit date: 1980    Years since quitting: 40.9  . Smokeless tobacco: Never Used  Substance and Sexual Activity  . Alcohol use: Yes    Alcohol/week: 2.0 - 3.0 standard drinks    Types: 2 - 3 Cans of beer per week    Frequency: Never    Comment: every evening  . Drug use: Never  . Sexual activity: Not on file  Lifestyle  . Physical activity    Days per week: Not on file    Minutes per session: Not on file  . Stress: Not on file  Relationships  . Social Herbalist on phone: Not on file    Gets together: Not on file    Attends religious service: Not on file    Active member of club or organization: Not on file    Attends meetings of clubs or organizations: Not on file    Relationship status: Not on file  . Intimate partner violence    Fear of current or ex partner: Not on file    Emotionally abused: Not on file    Physically abused: Not on file    Forced sexual activity: Not on file  Other Topics Concern  . Not on file  Social History Narrative   Lives in [place] with spouse.  Retired.   Previously worked as a Barrister's clerk- All systems are reviewed and negative except as per the HPI above.  Physical Exam: Vitals:   07/01/19 1057  BP: (!) 150/72  Pulse: 80  Weight: 84.2 kg  Height: 5\' 9"  (1.753 m)    GEN- The  patient is well appearing, alert and oriented x 3 today.   Head- normocephalic, atraumatic Eyes-  Sclera clear, conjunctiva pink Ears- hearing intact Oropharynx- clear Neck- supple  Lungs- Clear to ausculation bilaterally, normal work of breathing Heart- irregular rate and rhythm, no murmurs, rubs or gallops  GI- soft, NT, ND, + BS Extremities- no clubbing, cyanosis, or edema MS- no significant deformity or atrophy Skin- no rash  or lesion Psych- euthymic mood, full affect Neuro- strength and sensation are intact  Wt Readings from Last 3 Encounters:  07/01/19 84.2 kg  06/04/19 79.4 kg  05/28/19 82.1 kg    EKG today demonstrates V-paced rhythm HR 80, QRS 148, QTc 463  Echo 11/23/18 demonstrated  Moderately dilated left ventricle with severely reduced LVEF. Moderately reduced RV with moderately reduced contraction. No significant pericardial effusion. Moderate to severe mitral regurgitation  Epic records are reviewed at length today  Assessment and Plan:  1. Persistent atrial fibrillation Patient wants to pursue dofetilide, aware of risk vrs benefit Aware of price of dofetilide. Patient will continue on warfarin. Patient given instructions for 4 weekly INRs prior to dofetilide admission. This is followed by his PCP. No benadryl use PharmD has screened patient's medication list. QTc given paced QRS and ICD is acceptable at 463 ms.  Will plan for admission first week of January.   This patients CHA2DS2-VASc Score and unadjusted Ischemic Stroke Rate (% per year) is equal to 4.8 % stroke rate/year from a score of 4  Above score calculated as 1 point each if present [CHF, HTN, DM, Vascular=MI/PAD/Aortic Plaque, Age if 65-74, or Male] Above score calculated as 2 points each if present [Age > 75, or Stroke/TIA/TE]   2. CAD No anginal symptoms today.   3. ICM S/p ICD implant. No signs or symptoms of fluid overload.    Follow up 08/06/19 for dofetilide loading.   [facility] [address] [place], [place] [address] [phone] 07/01/2019 11:26 AM

## 2019-07-01 NOTE — Patient Instructions (Signed)
Let primary care know to start weekly INR checks and have them faxed to [place] weekly at [phone]. Have INR checked Jan.5th prior to the morning appointment.

## 2019-07-03 DIAGNOSIS — E119 Type 2 diabetes mellitus without complications: Secondary | ICD-10-CM | POA: Diagnosis not present

## 2019-07-03 DIAGNOSIS — I5022 Chronic systolic (congestive) heart failure: Secondary | ICD-10-CM | POA: Diagnosis not present

## 2019-07-03 DIAGNOSIS — Z0000 Encounter for general adult medical examination without abnormal findings: Secondary | ICD-10-CM | POA: Diagnosis not present

## 2019-07-03 DIAGNOSIS — Z7901 Long term (current) use of anticoagulants: Secondary | ICD-10-CM | POA: Diagnosis not present

## 2019-07-03 DIAGNOSIS — I10 Essential (primary) hypertension: Secondary | ICD-10-CM | POA: Diagnosis not present

## 2019-07-10 DIAGNOSIS — Z7901 Long term (current) use of anticoagulants: Secondary | ICD-10-CM | POA: Diagnosis not present

## 2019-08-01 ENCOUNTER — Other Ambulatory Visit (HOSPITAL_COMMUNITY): Payer: Medicare Other

## 2019-08-01 ENCOUNTER — Other Ambulatory Visit: Payer: Self-pay

## 2019-08-01 ENCOUNTER — Other Ambulatory Visit (HOSPITAL_COMMUNITY)
Admission: RE | Admit: 2019-08-01 | Discharge: 2019-08-01 | Disposition: A | Discharge status: Home / Self Care | Payer: Medicare Other | Source: Ambulatory Visit | Attending: Physician Assistant | Admitting: Physician Assistant

## 2019-08-01 DIAGNOSIS — Z20828 Contact with and (suspected) exposure to other viral communicable diseases: Secondary | ICD-10-CM | POA: Insufficient documentation

## 2019-08-01 DIAGNOSIS — Z01812 Encounter for preprocedural laboratory examination: Secondary | ICD-10-CM | POA: Diagnosis present

## 2019-08-01 LAB — SARS CORONAVIRUS 2 (TAT 6-24 HRS): SARS Coronavirus 2: NEGATIVE

## 2019-08-06 ENCOUNTER — Other Ambulatory Visit: Payer: Self-pay

## 2019-08-06 ENCOUNTER — Ambulatory Visit (HOSPITAL_COMMUNITY)
Admission: RE | Admit: 2019-08-06 | Discharge: 2019-08-06 | Disposition: A | Discharge status: Home / Self Care | Payer: Medicare Other | Source: Ambulatory Visit | Attending: Physician Assistant | Admitting: Physician Assistant

## 2019-08-06 ENCOUNTER — Inpatient Hospital Stay (HOSPITAL_COMMUNITY)
Admission: RE | Admit: 2019-08-06 | Discharge: 2019-08-09 | DRG: 309 | Disposition: A | Discharge status: Home / Self Care | Payer: Medicare Other | Source: Ambulatory Visit | Attending: Internal Medicine | Admitting: Internal Medicine

## 2019-08-06 VITALS — BP 120/62 | HR 80 | Ht 69.0 in | Wt 181.4 lb

## 2019-08-06 DIAGNOSIS — Z7984 Long term (current) use of oral hypoglycemic drugs: Secondary | ICD-10-CM | POA: Diagnosis not present

## 2019-08-06 DIAGNOSIS — Z951 Presence of aortocoronary bypass graft: Secondary | ICD-10-CM | POA: Diagnosis not present

## 2019-08-06 DIAGNOSIS — R9431 Abnormal electrocardiogram [ECG] [EKG]: Secondary | ICD-10-CM | POA: Insufficient documentation

## 2019-08-06 DIAGNOSIS — I4819 Other persistent atrial fibrillation: Secondary | ICD-10-CM | POA: Diagnosis present

## 2019-08-06 DIAGNOSIS — I255 Ischemic cardiomyopathy: Secondary | ICD-10-CM | POA: Diagnosis not present

## 2019-08-06 DIAGNOSIS — E119 Type 2 diabetes mellitus without complications: Secondary | ICD-10-CM | POA: Insufficient documentation

## 2019-08-06 DIAGNOSIS — Z809 Family history of malignant neoplasm, unspecified: Secondary | ICD-10-CM | POA: Insufficient documentation

## 2019-08-06 DIAGNOSIS — Z79899 Other long term (current) drug therapy: Secondary | ICD-10-CM | POA: Insufficient documentation

## 2019-08-06 DIAGNOSIS — E875 Hyperkalemia: Secondary | ICD-10-CM | POA: Diagnosis not present

## 2019-08-06 DIAGNOSIS — I5022 Chronic systolic (congestive) heart failure: Secondary | ICD-10-CM | POA: Diagnosis not present

## 2019-08-06 DIAGNOSIS — I447 Left bundle-branch block, unspecified: Secondary | ICD-10-CM | POA: Diagnosis not present

## 2019-08-06 DIAGNOSIS — Z87891 Personal history of nicotine dependence: Secondary | ICD-10-CM | POA: Diagnosis not present

## 2019-08-06 DIAGNOSIS — Z7901 Long term (current) use of anticoagulants: Secondary | ICD-10-CM

## 2019-08-06 DIAGNOSIS — Z7982 Long term (current) use of aspirin: Secondary | ICD-10-CM

## 2019-08-06 DIAGNOSIS — I2510 Atherosclerotic heart disease of native coronary artery without angina pectoris: Secondary | ICD-10-CM | POA: Diagnosis not present

## 2019-08-06 DIAGNOSIS — Z8249 Family history of ischemic heart disease and other diseases of the circulatory system: Secondary | ICD-10-CM | POA: Diagnosis not present

## 2019-08-06 DIAGNOSIS — Z95810 Presence of automatic (implantable) cardiac defibrillator: Secondary | ICD-10-CM | POA: Diagnosis not present

## 2019-08-06 DIAGNOSIS — D6869 Other thrombophilia: Secondary | ICD-10-CM

## 2019-08-06 DIAGNOSIS — J45909 Unspecified asthma, uncomplicated: Secondary | ICD-10-CM | POA: Insufficient documentation

## 2019-08-06 DIAGNOSIS — I25810 Atherosclerosis of coronary artery bypass graft(s) without angina pectoris: Secondary | ICD-10-CM | POA: Insufficient documentation

## 2019-08-06 DIAGNOSIS — E11649 Type 2 diabetes mellitus with hypoglycemia without coma: Secondary | ICD-10-CM | POA: Diagnosis not present

## 2019-08-06 DIAGNOSIS — I340 Nonrheumatic mitral (valve) insufficiency: Secondary | ICD-10-CM | POA: Insufficient documentation

## 2019-08-06 LAB — BASIC METABOLIC PANEL
Anion gap: 11 (ref 5–15)
BUN: 16 mg/dL (ref 8–23)
CO2: 24 mmol/L (ref 22–32)
Calcium: 9.1 mg/dL (ref 8.9–10.3)
Chloride: 93 mmol/L — ABNORMAL LOW (ref 98–111)
Creatinine, Ser: 0.95 mg/dL (ref 0.61–1.24)
GFR calc Af Amer: >60 mL/min (ref >60)
GFR calc non Af Amer: >60 mL/min (ref >60)
Glucose, Bld: 136 mg/dL — ABNORMAL HIGH (ref 70–99)
Potassium: 5.4 mmol/L — ABNORMAL HIGH (ref 3.5–5.1)
Sodium: 128 mmol/L — ABNORMAL LOW (ref 135–145)

## 2019-08-06 LAB — GLUCOSE, CAPILLARY
Glucose-Capillary: 125 mg/dL — ABNORMAL HIGH (ref 70–99)
Glucose-Capillary: 50 mg/dL — ABNORMAL LOW (ref 70–99)
Glucose-Capillary: 68 mg/dL — ABNORMAL LOW (ref 70–99)

## 2019-08-06 LAB — MAGNESIUM: Magnesium: 2.2 mg/dL (ref 1.7–2.4)

## 2019-08-06 LAB — PROTIME-INR
INR: 2.5 — ABNORMAL HIGH (ref 0.8–1.2)
Prothrombin Time: 26.7 seconds — ABNORMAL HIGH (ref 11.4–15.2)

## 2019-08-06 MED ORDER — SPIRONOLACTONE 50 MG PO TABS: 25.0000 mg | ORAL_TABLET | Freq: Every day | ORAL | Status: DC

## 2019-08-06 MED ORDER — VITAMIN D 25 MCG (1000 UNIT) PO TABS
2000.0000 [IU] | ORAL_TABLET | Freq: Every day | ORAL | Status: DC
  Administered 2019-08-07 – 2019-08-09 (×3): 2000 [IU] via ORAL
  Filled 2019-08-06 (×3): qty 2

## 2019-08-06 MED ORDER — GLUCOSAMINE-CHONDROITIN 250-200 MG PO CAPS: ORAL_CAPSULE | Freq: Two times a day (BID) | ORAL | Status: DC

## 2019-08-06 MED ORDER — MAGNESIUM OXIDE 400 (241.3 MG) MG PO TABS
400.0000 mg | ORAL_TABLET | Freq: Two times a day (BID) | ORAL | Status: DC
  Administered 2019-08-06 – 2019-08-09 (×6): 400 mg via ORAL
  Filled 2019-08-06 (×6): qty 1

## 2019-08-06 MED ORDER — TORSEMIDE 20 MG PO TABS
20.0000 mg | ORAL_TABLET | Freq: Every day | ORAL | Status: DC
  Administered 2019-08-07 – 2019-08-09 (×3): 20 mg via ORAL
  Filled 2019-08-06 (×3): qty 1

## 2019-08-06 MED ORDER — WARFARIN SODIUM 7.5 MG PO TABS
7.5000 mg | ORAL_TABLET | Freq: Once | ORAL | Status: AC
  Administered 2019-08-06: 7.5 mg via ORAL
  Filled 2019-08-06: qty 1

## 2019-08-06 MED ORDER — SODIUM CHLORIDE 0.9% FLUSH: 3.0000 mL | INTRAVENOUS | Status: DC | PRN

## 2019-08-06 MED ORDER — DIGOXIN 125 MCG PO TABS
0.1250 mg | ORAL_TABLET | Freq: Every day | ORAL | Status: DC
  Administered 2019-08-07 – 2019-08-09 (×3): 0.125 mg via ORAL
  Filled 2019-08-06 (×3): qty 1

## 2019-08-06 MED ORDER — ACETAMINOPHEN 500 MG PO TABS: 500.0000 mg | ORAL_TABLET | Freq: Four times a day (QID) | ORAL | Status: DC | PRN

## 2019-08-06 MED ORDER — CO Q-10 100 MG PO CAPS: 100.0000 mg | ORAL_CAPSULE | Freq: Every day | ORAL | Status: DC

## 2019-08-06 MED ORDER — CARVEDILOL 25 MG PO TABS
25.0000 mg | ORAL_TABLET | Freq: Two times a day (BID) | ORAL | Status: DC
  Administered 2019-08-06 – 2019-08-09 (×6): 25 mg via ORAL
  Filled 2019-08-06 (×6): qty 1

## 2019-08-06 MED ORDER — METFORMIN HCL 500 MG PO TABS
1000.0000 mg | ORAL_TABLET | Freq: Two times a day (BID) | ORAL | Status: DC
  Administered 2019-08-06 – 2019-08-09 (×6): 1000 mg via ORAL
  Filled 2019-08-06 (×6): qty 2

## 2019-08-06 MED ORDER — OMEGA-3-ACID ETHYL ESTERS 1 G PO CAPS
1.0000 g | ORAL_CAPSULE | Freq: Every day | ORAL | Status: DC
  Administered 2019-08-07 – 2019-08-09 (×3): 1 g via ORAL
  Filled 2019-08-06 (×3): qty 1

## 2019-08-06 MED ORDER — KETOTIFEN FUMARATE 0.025 % OP SOLN
1.0000 [drp] | Freq: Every day | OPHTHALMIC | Status: DC
  Administered 2019-08-07 – 2019-08-09 (×3): 1 [drp] via OPHTHALMIC
  Filled 2019-08-06: qty 5

## 2019-08-06 MED ORDER — SODIUM CHLORIDE 0.9 % IV SOLN: 250.0000 mL | INTRAVENOUS | Status: DC | PRN

## 2019-08-06 MED ORDER — SODIUM CHLORIDE 0.9% FLUSH
3.0000 mL | Freq: Two times a day (BID) | INTRAVENOUS | Status: DC
  Administered 2019-08-07 – 2019-08-09 (×5): 3 mL via INTRAVENOUS

## 2019-08-06 MED ORDER — WARFARIN - PHARMACIST DOSING INPATIENT: Freq: Every day | Status: DC

## 2019-08-06 MED ORDER — DOFETILIDE 500 MCG PO CAPS
500.0000 ug | ORAL_CAPSULE | Freq: Two times a day (BID) | ORAL | Status: DC
  Administered 2019-08-06 – 2019-08-09 (×6): 500 ug via ORAL
  Filled 2019-08-06 (×6): qty 1

## 2019-08-06 MED ORDER — IRBESARTAN 150 MG PO TABS
150.0000 mg | ORAL_TABLET | Freq: Every day | ORAL | Status: DC
  Administered 2019-08-07 – 2019-08-09 (×3): 150 mg via ORAL
  Filled 2019-08-06 (×3): qty 1

## 2019-08-06 MED ORDER — ASPIRIN EC 81 MG PO TBEC
81.0000 mg | DELAYED_RELEASE_TABLET | Freq: Every day | ORAL | Status: DC
  Administered 2019-08-07 – 2019-08-09 (×3): 81 mg via ORAL
  Filled 2019-08-06 (×3): qty 1

## 2019-08-06 MED ORDER — ATORVASTATIN CALCIUM 80 MG PO TABS
80.0000 mg | ORAL_TABLET | Freq: Every evening | ORAL | Status: DC
  Administered 2019-08-06 – 2019-08-08 (×3): 80 mg via ORAL
  Filled 2019-08-06 (×3): qty 1

## 2019-08-06 NOTE — TOC Benefit Eligibility Note (Signed)
Transition of Care [facility]) Benefit Eligibility Note    Patient Details  Name: Brian Bruce MRN: [id number] Date of Birth: 1945-09-25   Medication/Dose: DOFETILIDE   125 MG BID   ,  250 MCG BID    ,  500 MCG  BID  Covered?: Yes  Tier: (TIER- 4 DRUG)  Prescription Coverage Preferred Pharmacy: [facility] [place] with Person/Company/Phone Number:: EMILY  @  [employer] # [phone]  Co-Pay: 125 MCG BID -  $225.00     ,  250 MCG BID -  $ 127.00   ,  500 MCG BID-  $225.50     Deductible: Unmet  Additional Notes: TIKOSYN : NOT COVER / NON-FORMULARY , P/A -YES # [id number]    Memory Argue Phone Number: 08/06/2019, 5:01 PM

## 2019-08-06 NOTE — H&P (Signed)
Primary Care Physician: Dr Stoney Bang Primary Cardiologist: Dr Dot Lanes Primary Electrophysiologist: Dr Rayann Heman Referring Physician: Tommye Standard PA   Brian Bruce is a 74 y.o. male with a history of CAD (CABG 1998), DM, persistent Afib, ICM s/p ICD, chronic CHF (systolic), LBBB who presents for follow up in the [facility]. Patient seen in EP clinic post ICD implant with persistent afib. He is on warfarin for a CHADS2VASC score of 4. Patient would like to pursue Tikosyn. He is fairly unaware of his arrhythmia with no increased SOB, fatigue, palpitations, or heart racing. He denies significant snoring or alcohol use.  On follow up today, patient presents for dofetilide admission. Patient feels well overall today. No heart racing or palpitations. He has checked the price of dofetilide and it will be affordable for him.   Today, he denies symptoms of palpitations, chest pain, shortness of breath, orthopnea, PND, lower extremity edema, dizziness, presyncope, syncope, snoring, daytime somnolence, bleeding, or neurologic sequela. The patient is tolerating medications without difficulties and is otherwise without complaint today.    Atrial Fibrillation Risk Factors:  he does not have symptoms or diagnosis of sleep apnea. he does not have a history of rheumatic fever.   he has a BMI of Body mass index is 26.6 kg/m.Marland Kitchen Filed Weights   08/06/19 1528  Weight: 81.7 kg    Family History  Problem Relation Age of Onset  . Cancer Mother   . Heart failure Father      Atrial Fibrillation Management history:  Previous antiarrhythmic drugs: one Previous cardioversions: none Previous ablations: none CHADS2VASC score: 4 Anticoagulation history: warfarin    Past Medical History:  Diagnosis Date  . Asthma   . CHF (congestive heart failure) ([place])   . Coronary artery disease   . Diabetes mellitus without complication ([place])   . Ischemic cardiomyopathy   .  LBBB (left bundle branch block)   . Persistent atrial fibrillation [facility])    Past Surgical History:  Procedure Laterality Date  . BIV ICD INSERTION CRT-D N/A 05/16/2019   Procedure: BIV ICD INSERTION CRT-D;  Surgeon: Thompson Grayer, MD;  Location: [place] [facility];  Service: Cardiovascular;  Laterality: N/A;  . CORONARY ARTERY BYPASS GRAFT  1998   4-vessel  . RIGHT/LEFT HEART CATH AND CORONARY/GRAFT ANGIOGRAPHY N/A 03/07/2019   Procedure: RIGHT/LEFT HEART CATH AND CORONARY/GRAFT ANGIOGRAPHY;  Surgeon: Nelva Bush, MD;  Location: [place] [facility];  Service: Cardiovascular;  Laterality: N/A;    Current Facility-Administered Medications  Medication Dose Route Frequency Provider Last Rate Last Admin  . 0.9 %  sodium chloride infusion  250 mL Intravenous PRN Fenton, Clint R, PA      . acetaminophen (TYLENOL) tablet 500 mg  500 mg Oral Q6H PRN Fenton, Clint R, PA      . [START ON 08/07/2019] aspirin EC tablet 81 mg  81 mg Oral Daily Fenton, Clint R, PA      . atorvastatin (LIPITOR) tablet 80 mg  80 mg Oral QPM Fenton, Clint R, PA   80 mg at 08/06/19 1744  . carvedilol (COREG) tablet 25 mg  25 mg Oral BID Fenton, Clint R, PA      . [START ON 08/07/2019] cholecalciferol (VITAMIN D3) tablet 2,000 Units  2,000 Units Oral Daily Fenton, Clint R, PA      . [START ON 08/07/2019] digoxin (LANOXIN) tablet 0.125 mg  0.125 mg Oral Daily Fenton, Clint R, PA      . dofetilide (  TIKOSYN) capsule 500 mcg  500 mcg Oral BID Fenton, Clint R, PA      . [START ON 08/07/2019] irbesartan (AVAPRO) tablet 150 mg  150 mg Oral Daily Fenton, Clint R, PA      . [START ON 08/07/2019] ketotifen (ZADITOR) 0.025 % ophthalmic solution 1 drop  1 drop Both Eyes Daily Fenton, Clint R, PA      . magnesium oxide (MAG-OX) tablet 400 mg  400 mg Oral BID Fenton, Clint R, PA      . metFORMIN (GLUCOPHAGE) tablet 1,000 mg  1,000 mg Oral BID WC Fenton, Clint R, PA   1,000 mg at 08/06/19 1744  . [START ON 08/07/2019] omega-3 acid ethyl esters  (LOVAZA) capsule 1 g  1 g Oral Daily Fenton, Clint R, PA      . sodium chloride flush (NS) 0.9 % injection 3 mL  3 mL Intravenous Q12H Fenton, Clint R, PA      . sodium chloride flush (NS) 0.9 % injection 3 mL  3 mL Intravenous PRN Fenton, Clint R, PA      . [START ON 08/07/2019] torsemide (DEMADEX) tablet 20 mg  20 mg Oral Daily Fenton, Clint R, PA      . Warfarin - Pharmacist Dosing Inpatient   Does not apply q1800 Lyndee Leo, [facility]        No Known Allergies  Social History   Socioeconomic History  . Marital status: Married    Spouse name: Not on file  . Number of children: Not on file  . Years of education: Not on file  . Highest education level: Not on file  Occupational History  . Not on file  Tobacco Use  . Smoking status: Former Smoker    Types: Cigarettes    Quit date: 1980    Years since quitting: 41.0  . Smokeless tobacco: Never Used  Substance and Sexual Activity  . Alcohol use: Yes    Alcohol/week: 2.0 - 3.0 standard drinks    Types: 2 - 3 Cans of beer per week    Comment: every evening  . Drug use: Never  . Sexual activity: Not on file  Other Topics Concern  . Not on file  Social History Narrative   Lives in [place] with spouse.  Retired.   Previously worked as a Designer, television/film set:   . Difficulty of Paying Living Expenses: Not on file  Food Insecurity:   . Worried About Charity fundraiser in the Last Year: Not on file  . Ran Out of Food in the Last Year: Not on file  Transportation Needs:   . Lack of Transportation (Medical): Not on file  . Lack of Transportation (Non-Medical): Not on file  Physical Activity:   . Days of Exercise per Week: Not on file  . Minutes of Exercise per Session: Not on file  Stress:   . Feeling of Stress : Not on file  Social Connections:   . Frequency of Communication with Friends and Family: Not on file  . Frequency of Social Gatherings with Friends and  Family: Not on file  . Attends Religious Services: Not on file  . Active Member of Clubs or Organizations: Not on file  . Attends Archivist Meetings: Not on file  . Marital Status: Not on file  Intimate Partner Violence:   . Fear of Current or Ex-Partner: Not on file  . Emotionally Abused: Not  on file  . Physically Abused: Not on file  . Sexually Abused: Not on file     ROS- All systems are reviewed and negative except as per the HPI above.  Physical Exam: Vitals:   08/06/19 1528  Weight: 81.7 kg  Height: 5\' 9"  (1.753 m)    GEN- The patient is well appearing, alert and oriented x 3 today.   HEENT-head normocephalic, atraumatic, sclera clear, conjunctiva pink, hearing intact, trachea midline. Lungs- Clear to ausculation bilaterally, normal work of breathing Heart- Regular rate and rhythm, no murmurs, rubs or gallops  GI- soft, NT, ND, + BS Extremities- no clubbing, cyanosis, or edema MS- no significant deformity or atrophy Skin- no rash or lesion Psych- euthymic mood, full affect Neuro- strength and sensation are intact   Wt Readings from Last 3 Encounters:  08/06/19 81.7 kg  08/06/19 82.3 kg  07/01/19 84.2 kg    EKG today demonstrates V-paced rhythm HR 80, QRS 136, QTc 463  Echo 11/23/18 demonstrated  Moderately dilated left ventricle with severely reduced LVEF. Moderately reduced RV with moderately reduced contraction. No significant pericardial effusion. Moderate to severe mitral regurgitation  Epic records are reviewed at length today  Assessment and Plan:  1. Persistent atrial fibrillation Patient wants to pursue dofetilide, aware of risk vrs benefit. Aware of price of dofetilide. Patient will continue on warfarin. INR 2.1 (11/26) 2.3 (12/3) 2.3 (12/16) 2.9 (12/23). 2.5 today. D/w Dr Rayann Heman and OK for admission.  No benadryl use PharmD has screened drugs. Will monitor on torsemide. QTc with paced QRS 463 ms, Labs today show creatinine at 0.95,  K+ 5.4 and mag 2.2, CrCl calculated at 82 mL/min  This patients CHA2DS2-VASc Score and unadjusted Ischemic Stroke Rate (% per year) is equal to 4.8 % stroke rate/year from a score of 4  Above score calculated as 1 point each if present [CHF, HTN, DM, Vascular=MI/PAD/Aortic Plaque, Age if 65-74, or Male] Above score calculated as 2 points each if present [Age > 75, or Stroke/TIA/TE]   2. CAD No anginal symptoms.  3. ICM S/p ICD, followed by Dr Rayann Heman and the device clinic.   4. Hyperkalemia K+ 5.4 today. Will need to monitor on spironolactone.    To be admitted today once a bed becomes available.    [facility] [address] [place], [place] [address] [phone] 08/06/2019 7:28 PM     I have seen, examined the patient, and reviewed the above assessment and plan.  Changes to above are made where necessary.  On exam, RRR (paced) Admit for initiation of tikosyn.  Therapeutic INRs reviewed.  Co Sign: Thompson Grayer, MD 08/06/2019 7:28 PM

## 2019-08-06 NOTE — Progress Notes (Signed)
Pharmacy: Dofetilide (Tikosyn) - Initial Consult Assessment and Electrolyte Replacement  Pharmacy consulted to assist in monitoring and replacing electrolytes in this 74 y.o. male admitted on 08/06/2019 undergoing dofetilide initiation. First dofetilide dose: due tonight 1/5  Assessment:  Patient Exclusion Criteria: If any screening criteria checked as "Yes", then  patient  should NOT receive dofetilide until criteria item is corrected.  If "Yes" please indicate correction plan.  YES  NO Patient  Exclusion Criteria Correction Plan   []   [x]   Baseline QTc interval is greater than or equal to 440 msec. IF above YES box checked dofetilide contraindicated unless patient has ICD; then may proceed if QTc 500-550 msec or with known ventricular conduction abnormalities may proceed with QTc 550-600 msec. QTc = 463    []   [x]   Patient is known or suspected to have a digoxin level greater than 2 ng/ml: No results found for: DIGOXIN     []   [x]   Creatinine clearance less than 20 ml/min (calculated using Cockcroft-Gault, actual body weight and serum creatinine): Estimated Creatinine Clearance: 70.3 mL/min (by C-G formula based on SCr of 0.95 mg/dL).     []   [x]  Patient has received drugs known to prolong the QT intervals within the last 48 hours (phenothiazines, tricyclics or tetracyclic antidepressants, erythromycin, H-1 antihistamines, cisapride, fluoroquinolones, azithromycin). Updated information on QT prolonging agents is available to be searched on the following database:QT prolonging agents     []   [x]   Patient received a dose of hydrochlorothiazide (Oretic) alone or in any combination including triamterene (Dyazide, Maxzide) in the last 48 hours.    []   [x]  Patient received a medication known to increase dofetilide plasma concentrations prior to initial dofetilide dose:  . Trimethoprim (Primsol, Proloprim) in the last 36 hours . Verapamil (Calan, Verelan) in the last 36 hours or a  sustained release dose in the last 72 hours . Megestrol (Megace) in the last 5 days  . Cimetidine (Tagamet) in the last 6 hours . Ketoconazole (Nizoral) in the last 24 hours . Itraconazole (Sporanox) in the last 48 hours  . Prochlorperazine (Compazine) in the last 36 hours     []   [x]   Patient is known to have a history of torsades de pointes; congenital or acquired long QT syndromes.    []   [x]   Patient has received a Class 1 antiarrhythmic with less than 2 half-lives since last dose. (Disopyramide, Quinidine, Procainamide, Lidocaine, Mexiletine, Flecainide, Propafenone)    []   [x]   Patient has received amiodarone therapy in the past 3 months or amiodarone level is greater than 0.3 ng/ml.    Patient has been appropriately anticoagulated with warfarin. Labs:    Component Value Date/Time   K 5.4 (H) 08/06/2019 1135   MG 2.2 08/06/2019 1135     Plan: Potassium: K >/= 4: Appropriate to initiate Tikosyn, no replacement needed    Magnesium: Mg >2: Appropriate to initiate Tikosyn, no replacement needed     Thank you for allowing pharmacy to participate in this patient's care   Erin Hearing PharmD., BCPS Clinical Pharmacist 08/06/2019 3:13 PM

## 2019-08-06 NOTE — Progress Notes (Signed)
ANTICOAGULATION CONSULT NOTE - Initial Consult  Pharmacy Consult for warfarin Indication: atrial fibrillation  No Known Allergies  Patient Measurements:    Vital Signs: BP: 120/62 (01/05 1119) Pulse Rate: 80 (01/05 1119)  Labs: Recent Labs    08/06/19 1135  LABPROT 26.7*  INR 2.5*  CREATININE 0.95    Estimated Creatinine Clearance: 70.3 mL/min (by C-G formula based on SCr of 0.95 mg/dL).   Medical History: Past Medical History:  Diagnosis Date  . Asthma   . CHF (congestive heart failure) ([place])   . Coronary artery disease   . Diabetes mellitus without complication ([place])   . Ischemic cardiomyopathy   . LBBB (left bundle branch block)   . Persistent atrial fibrillation [facility])    Assessment: 74 year old male being admitted for tikosyn initiation. Patient is on chronic warfarin for afib. INR therapeutic this am. Will continue home dose of 7.5mg  daily.   Goal of Therapy:  INR 2-3 Monitor platelets by anticoagulation protocol: Yes   Plan:  Warfarin 7.5mg  daily INR daily for at least 3 days  Erin Hearing PharmD., BCPS Clinical Pharmacist 08/06/2019 3:11 PM

## 2019-08-06 NOTE — Progress Notes (Signed)
Primary Care Physician: Dr Stoney Bang Primary Cardiologist: Dr Dot Lanes Primary Electrophysiologist: Dr Rayann Heman Referring Physician: Tommye Standard PA   Brian Bruce is a 74 y.o. male with a history of CAD (CABG 1998), DM, persistent Afib, ICM s/p ICD, chronic CHF (systolic), LBBB who presents for follow up in the [facility]. Patient seen in EP clinic post ICD implant with persistent afib. He is on warfarin for a CHADS2VASC score of 4. Patient would like to pursue Tikosyn. He is fairly unaware of his arrhythmia with no increased SOB, fatigue, palpitations, or heart racing. He denies significant snoring or alcohol use.  On follow up today, patient presents for dofetilide admission. Patient feels well overall today. No heart racing or palpitations. He has checked the price of dofetilide and it will be affordable for him.   Today, he denies symptoms of palpitations, chest pain, shortness of breath, orthopnea, PND, lower extremity edema, dizziness, presyncope, syncope, snoring, daytime somnolence, bleeding, or neurologic sequela. The patient is tolerating medications without difficulties and is otherwise without complaint today.    Atrial Fibrillation Risk Factors:  he does not have symptoms or diagnosis of sleep apnea. he does not have a history of rheumatic fever.   he has a BMI of Body mass index is 26.79 kg/m.Marland Kitchen Filed Weights   08/06/19 1119  Weight: 82.3 kg    Family History  Problem Relation Age of Onset  . Cancer Mother   . Heart failure Father      Atrial Fibrillation Management history:  Previous antiarrhythmic drugs: one Previous cardioversions: none Previous ablations: none CHADS2VASC score: 4 Anticoagulation history: warfarin    Past Medical History:  Diagnosis Date  . Asthma   . CHF (congestive heart failure) ([place])   . Coronary artery disease   . Diabetes mellitus without complication ([place])   . Ischemic cardiomyopathy   .  LBBB (left bundle branch block)   . Persistent atrial fibrillation [facility])    Past Surgical History:  Procedure Laterality Date  . BIV ICD INSERTION CRT-D N/A 05/16/2019   Procedure: BIV ICD INSERTION CRT-D;  Surgeon: Thompson Grayer, MD;  Location: [place] [facility];  Service: Cardiovascular;  Laterality: N/A;  . CORONARY ARTERY BYPASS GRAFT  1998   4-vessel  . RIGHT/LEFT HEART CATH AND CORONARY/GRAFT ANGIOGRAPHY N/A 03/07/2019   Procedure: RIGHT/LEFT HEART CATH AND CORONARY/GRAFT ANGIOGRAPHY;  Surgeon: Nelva Bush, MD;  Location: [place] [facility];  Service: Cardiovascular;  Laterality: N/A;    Current Outpatient Medications  Medication Sig Dispense Refill  . acetaminophen (TYLENOL) 500 MG tablet Take 500 mg by mouth every 6 (six) hours as needed for moderate pain.    Marland Kitchen aspirin EC 81 MG tablet Take 81 mg by mouth daily.    Marland Kitchen atorvastatin (LIPITOR) 80 MG tablet Take 80 mg by mouth every evening.    Marland Kitchen augmented betamethasone dipropionate (DIPROLENE-AF) 0.05 % cream Apply 1 application topically 2 (two) times daily as needed (rash).    . candesartan (ATACAND) 16 MG tablet Take 16 mg by mouth daily.    . carvedilol (COREG) 25 MG tablet Take 25 mg by mouth 2 (two) times daily.     . Cholecalciferol (VITAMIN D) 50 MCG (2000 UT) tablet Take 2,000 Units by mouth daily.    . Coenzyme Q10 (CO Q-10) 100 MG CAPS Take 100 mg by mouth daily.    . digoxin (LANOXIN) 0.125 MG tablet Take 0.125 mg by mouth daily.    Marland Kitchen GLUCOSAMINE-CHONDROITIN  PO Take 1 tablet by mouth 2 (two) times daily.    Marland Kitchen ketotifen (ALAWAY) 0.025 % ophthalmic solution Place 1 drop into both eyes daily.    . magnesium oxide (MAG-OX) 400 (241.3 Mg) MG tablet Take 400 mg by mouth 2 (two) times daily.     . metFORMIN (GLUCOPHAGE) 1000 MG tablet Take 1 tablet (1,000 mg total) by mouth 2 (two) times daily.    . Omega-3 Fatty Acids (FISH OIL) 1000 MG CAPS Take 1,000 mg by mouth daily.    Marland Kitchen spironolactone (ALDACTONE) 50 MG tablet Take 25  mg by mouth daily.    Marland Kitchen torsemide (DEMADEX) 20 MG tablet Take 1 tablet (20 mg total) by mouth daily.    Marland Kitchen warfarin (COUMADIN) 5 MG tablet Take 5-7.5 mg by mouth See admin instructions. TAKE 1.5 TABLETS (7.5 MG) BY MOUTH     No current facility-administered medications for this encounter.    No Known Allergies  Social History   Socioeconomic History  . Marital status: Married    Spouse name: Not on file  . Number of children: Not on file  . Years of education: Not on file  . Highest education level: Not on file  Occupational History  . Not on file  Tobacco Use  . Smoking status: Former Smoker    Types: Cigarettes    Quit date: 1980    Years since quitting: 41.0  . Smokeless tobacco: Never Used  Substance and Sexual Activity  . Alcohol use: Yes    Alcohol/week: 2.0 - 3.0 standard drinks    Types: 2 - 3 Cans of beer per week    Comment: every evening  . Drug use: Never  . Sexual activity: Not on file  Other Topics Concern  . Not on file  Social History Narrative   Lives in [place] with spouse.  Retired.   Previously worked as a Designer, television/film set:   . Difficulty of Paying Living Expenses: Not on file  Food Insecurity:   . Worried About Charity fundraiser in the Last Year: Not on file  . Ran Out of Food in the Last Year: Not on file  Transportation Needs:   . Lack of Transportation (Medical): Not on file  . Lack of Transportation (Non-Medical): Not on file  Physical Activity:   . Days of Exercise per Week: Not on file  . Minutes of Exercise per Session: Not on file  Stress:   . Feeling of Stress : Not on file  Social Connections:   . Frequency of Communication with Friends and Family: Not on file  . Frequency of Social Gatherings with Friends and Family: Not on file  . Attends Religious Services: Not on file  . Active Member of Clubs or Organizations: Not on file  . Attends Archivist  Meetings: Not on file  . Marital Status: Not on file  Intimate Partner Violence:   . Fear of Current or Ex-Partner: Not on file  . Emotionally Abused: Not on file  . Physically Abused: Not on file  . Sexually Abused: Not on file     ROS- All systems are reviewed and negative except as per the HPI above.  Physical Exam: Vitals:   08/06/19 1119  BP: 120/62  Pulse: 80  Weight: 82.3 kg  Height: 5\' 9"  (1.753 m)    GEN- The patient is well appearing, alert and oriented x 3 today.  HEENT-head normocephalic, atraumatic, sclera clear, conjunctiva pink, hearing intact, trachea midline. Lungs- Clear to ausculation bilaterally, normal work of breathing Heart- Regular rate and rhythm, no murmurs, rubs or gallops  GI- soft, NT, ND, + BS Extremities- no clubbing, cyanosis, or edema MS- no significant deformity or atrophy Skin- no rash or lesion Psych- euthymic mood, full affect Neuro- strength and sensation are intact   Wt Readings from Last 3 Encounters:  08/06/19 82.3 kg  07/01/19 84.2 kg  06/04/19 79.4 kg    EKG today demonstrates V-paced rhythm HR 80, QRS 136, QTc 463  Echo 11/23/18 demonstrated  Moderately dilated left ventricle with severely reduced LVEF. Moderately reduced RV with moderately reduced contraction. No significant pericardial effusion. Moderate to severe mitral regurgitation  Epic records are reviewed at length today  Assessment and Plan:  1. Persistent atrial fibrillation Patient wants to pursue dofetilide, aware of risk vrs benefit. Aware of price of dofetilide. Patient will continue on warfarin. INR 2.1 (11/26) 2.3 (12/3) 2.3 (12/16) 2.9 (12/23). 2.5 today. D/w Dr Rayann Heman and OK for admission.  No benadryl use PharmD has screened drugs. Will monitor on torsemide. QTc with paced QRS 463 ms, Labs today show creatinine at 0.95, K+ 5.4 and mag 2.2, CrCl calculated at 82 mL/min  This patients CHA2DS2-VASc Score and unadjusted Ischemic Stroke Rate (% per  year) is equal to 4.8 % stroke rate/year from a score of 4  Above score calculated as 1 point each if present [CHF, HTN, DM, Vascular=MI/PAD/Aortic Plaque, Age if 65-74, or Male] Above score calculated as 2 points each if present [Age > 75, or Stroke/TIA/TE]   2. CAD No anginal symptoms.  3. ICM S/p ICD, followed by Dr Rayann Heman and the device clinic.   4. Hyperkalemia K+ 5.4 today. Will need to monitor on spironolactone.    To be admitted today once a bed becomes available.    [facility] [address] [place], [place] [address] [phone] 08/06/2019 12:29 PM

## 2019-08-07 ENCOUNTER — Encounter (HOSPITAL_COMMUNITY): Payer: Self-pay | Admitting: Internal Medicine

## 2019-08-07 LAB — PROTIME-INR
INR: 2.2 — ABNORMAL HIGH (ref 0.8–1.2)
Prothrombin Time: 24.1 seconds — ABNORMAL HIGH (ref 11.4–15.2)

## 2019-08-07 LAB — GLUCOSE, CAPILLARY
Glucose-Capillary: 115 mg/dL — ABNORMAL HIGH (ref 70–99)
Glucose-Capillary: 95 mg/dL (ref 70–99)
Glucose-Capillary: 115 mg/dL — ABNORMAL HIGH (ref 70–99)
Glucose-Capillary: 56 mg/dL — ABNORMAL LOW (ref 70–99)
Glucose-Capillary: 112 mg/dL — ABNORMAL HIGH (ref 70–99)

## 2019-08-07 LAB — BASIC METABOLIC PANEL
Anion gap: 9 (ref 5–15)
BUN: 23 mg/dL (ref 8–23)
CO2: 26 mmol/L (ref 22–32)
Calcium: 8.8 mg/dL — ABNORMAL LOW (ref 8.9–10.3)
Chloride: 97 mmol/L — ABNORMAL LOW (ref 98–111)
Creatinine, Ser: 0.89 mg/dL (ref 0.61–1.24)
GFR calc Af Amer: >60 mL/min (ref >60)
GFR calc non Af Amer: >60 mL/min (ref >60)
Glucose, Bld: 111 mg/dL — ABNORMAL HIGH (ref 70–99)
Potassium: 4.5 mmol/L (ref 3.5–5.1)
Sodium: 132 mmol/L — ABNORMAL LOW (ref 135–145)

## 2019-08-07 LAB — MAGNESIUM: Magnesium: 2 mg/dL (ref 1.7–2.4)

## 2019-08-07 LAB — DIGOXIN LEVEL: Digoxin Level: 0.6 ng/mL — ABNORMAL LOW (ref 0.8–2.0)

## 2019-08-07 MED ORDER — WARFARIN SODIUM 7.5 MG PO TABS
7.5000 mg | ORAL_TABLET | Freq: Every day | ORAL | Status: DC
  Administered 2019-08-07 – 2019-08-08 (×2): 7.5 mg via ORAL
  Filled 2019-08-07 (×2): qty 1

## 2019-08-07 NOTE — Progress Notes (Signed)
Pharmacy: Dofetilide (Tikosyn) - Follow Up Assessment and Electrolyte Replacement  Pharmacy consulted to assist in monitoring and replacing electrolytes in this 74 y.o. male admitted on 08/06/2019 undergoing dofetilide initiation. First dofetilide dose: 08/06/19  Labs:    Component Value Date/Time   K 4.5 08/07/2019 0406   MG 2.0 08/07/2019 0406     Plan: Potassium: K >/= 4: No additional supplementation needed  Magnesium: Mg > 2: No additional supplementation needed   Patient has not required potassium supplements this admission. Of note he is on spironolactone which is on hold d/t elevated potassium on admit.   Thank you for allowing pharmacy to participate in this patient's care   Erin Hearing PharmD., BCPS Clinical Pharmacist 08/07/2019 9:13 AM

## 2019-08-07 NOTE — Progress Notes (Signed)
Morning EKG reviewed  Shows remains in AV dual-paced rhythm at 60 bpm with stable QTc at 420-440 ms when measured by hand and corrected for paced QRS.   Continue Tikosyn 500 mcg BID.   Pt will not require DCCV   Annamaria Helling  Pager: [phone]  08/07/2019 1:48 PM

## 2019-08-07 NOTE — Progress Notes (Addendum)
Electrophysiology Rounding Note  Patient Name: Brian Bruce Date of Encounter: 08/07/2019  Electrophysiologist: Dr. Rayann Heman   Subjective   Pt is in an A-V dual paced rhythm this am at 60 bpm on Tikosyn 500 mcg BID   QTc from EKG last pm shows stable QTc at ~440 ms when corrected for V-Paced QRS of 166 ms.  The patient is doing well today.  At this time, the patient denies chest pain, shortness of breath, or any new concerns.  Inpatient Medications    Scheduled Meds: . aspirin EC  81 mg Oral Daily  . atorvastatin  80 mg Oral QPM  . carvedilol  25 mg Oral BID  . cholecalciferol  2,000 Units Oral Daily  . digoxin  0.125 mg Oral Daily  . dofetilide  500 mcg Oral BID  . irbesartan  150 mg Oral Daily  . ketotifen  1 drop Both Eyes Daily  . magnesium oxide  400 mg Oral BID  . metFORMIN  1,000 mg Oral BID WC  . omega-3 acid ethyl esters  1 g Oral Daily  . sodium chloride flush  3 mL Intravenous Q12H  . torsemide  20 mg Oral Daily  . Warfarin - Pharmacist Dosing Inpatient   Does not apply q1800   Continuous Infusions: . sodium chloride     PRN Meds: sodium chloride, acetaminophen, sodium chloride flush   Vital Signs    Vitals:   08/06/19 1528 08/06/19 2205 08/07/19 0413 08/07/19 0416  BP:  (!) 142/81 (!) 122/59   Pulse:  79 (!) 59 62  Temp:  97.9 F (36.6 C) 97.9 F (36.6 C)   TempSrc:  Oral Oral   SpO2:   97% 99%  Weight: 81.7 kg   80.2 kg  Height: 5\' 9"  (1.753 m)       Intake/Output Summary (Last 24 hours) at 08/07/2019 0714 Last data filed at 08/06/2019 1600 Gross per 24 hour  Intake 360 ml  Output --  Net 360 ml   Filed Weights   08/06/19 1528 08/07/19 0416  Weight: 81.7 kg 80.2 kg    Physical Exam    GEN- The patient is well appearing, alert and oriented x 3 today.   Head- normocephalic, atraumatic Eyes-  Sclera clear, conjunctiva pink Ears- hearing intact Oropharynx- clear Neck- supple Lungs- Clear to ausculation bilaterally, normal work of  breathing Heart- Regular rate and rhythm, no murmurs, rubs or gallops GI- soft, NT, ND, + BS Extremities- no clubbing, cyanosis, or edema Skin- no rash or lesion Psych- euthymic mood, full affect Neuro- strength and sensation are intact  Labs    CBC No results for input(s): WBC, NEUTROABS, HGB, HCT, MCV, PLT in the last 72 hours. Basic Metabolic Panel Recent Labs    08/06/19 1135 08/07/19 0406  NA 128* 132*  K 5.4* 4.5  CL 93* 97*  CO2 24 26  GLUCOSE 136* 111*  BUN 16 23  CREATININE 0.95 0.89  CALCIUM 9.1 8.8*  MG 2.2 2.0    Potassium  Date/Time Value Ref Range Status  08/07/2019 04:06 AM 4.5 3.5 - 5.1 mmol/L Final   Magnesium  Date/Time Value Ref Range Status  08/07/2019 04:06 AM 2.0 1.7 - 2.4 mg/dL Final    Comment:    Performed at [facility], [place] [address], [place], [place] [address]    Telemetry    A-V dual paced at 60 bpm (personally reviewed)  Radiology    No results found.   Patient Profile  Brian Bruce is a 74 y.o. male with a past medical history significant for persistent atrial fibrillation.  They were admitted for tikosyn load.   Assessment & Plan    1. Persistent atrial fibrillation Pt is in an A-V dual paced rhythm (sinus) on Tikosyn 500 mcg BID  Continue Coumadin Electrolytes stable.  CHA2DS2VASC is at least 5  2. CAD Denies ischemic symptoms  3. DM2 Slightly hypoglycemic yesterday evening on metformin alone.  Follow.  Consider Diabetes coordinator consult if needed.   Pt will not require DCCV. Will continue inpatient tikosyn load through Friday am, 08/09/19.  For questions or updates, please contact [place] Please consult [email or web address] for contact info under Cardiology/STEMI.  Signed, Shirley Friar, PA-C  08/07/2019, 7:14 AM    I have reviewed the above assessment and plan.  Changes to above are made where necessary. Now in sinus rhythm.  QT is stable.  Continue tikosyn load.  Co Sign: Thompson Grayer, MD 08/07/2019

## 2019-08-07 NOTE — Progress Notes (Signed)
Post Tikosyn QTc 523 .   VPaced

## 2019-08-07 NOTE — Plan of Care (Signed)
  Problem: Clinical Measurements: Goal: Ability to maintain clinical measurements within normal limits will improve Outcome: Progressing   Problem: Health Behavior/Discharge Planning: Goal: Ability to manage health-related needs will improve Outcome: Completed/Met   Problem: Nutrition: Goal: Adequate nutrition will be maintained Outcome: Completed/Met

## 2019-08-07 NOTE — Progress Notes (Signed)
ANTICOAGULATION CONSULT NOTE - Initial Consult  Pharmacy Consult for warfarin Indication: atrial fibrillation  No Known Allergies  Patient Measurements: Height: 5\' 9"  (175.3 cm) Weight: 176 lb 14.4 oz (80.2 kg) IBW/kg (Calculated) : 70.7  Vital Signs: Temp: 97.9 F (36.6 C) (01/06 0413) Temp Source: Oral (01/06 0413) BP: 126/57 (01/06 0813) Pulse Rate: 59 (01/06 0813)  Labs: Recent Labs    08/06/19 1135 08/07/19 0406  LABPROT 26.7* 24.1*  INR 2.5* 2.2*  CREATININE 0.95 0.89    Estimated Creatinine Clearance: 75 mL/min (by C-G formula based on SCr of 0.89 mg/dL).   Medical History: Past Medical History:  Diagnosis Date  . Asthma   . CHF (congestive heart failure) ([place])   . Coronary artery disease   . Diabetes mellitus without complication ([place])   . Ischemic cardiomyopathy   . LBBB (left bundle branch block)   . Persistent atrial fibrillation [facility])    Assessment: 74 year old male being admitted for tikosyn initiation. Patient is on chronic warfarin for afib. INR continues to be therapeutic this am. Will continue home dose of 7.5mg  daily.   Goal of Therapy:  INR 2-3 Monitor platelets by anticoagulation protocol: Yes   Plan:  Warfarin 7.5mg  daily INR daily   Erin Hearing PharmD., BCPS Clinical Pharmacist 08/07/2019 9:13 AM

## 2019-08-08 ENCOUNTER — Encounter (HOSPITAL_COMMUNITY)
Admission: RE | Disposition: A | Discharge status: Home / Self Care | Payer: Self-pay | Source: Ambulatory Visit | Attending: Internal Medicine

## 2019-08-08 LAB — BASIC METABOLIC PANEL
Anion gap: 7 (ref 5–15)
BUN: 20 mg/dL (ref 8–23)
CO2: 29 mmol/L (ref 22–32)
Calcium: 8.8 mg/dL — ABNORMAL LOW (ref 8.9–10.3)
Chloride: 97 mmol/L — ABNORMAL LOW (ref 98–111)
Creatinine, Ser: 0.91 mg/dL (ref 0.61–1.24)
GFR calc Af Amer: >60 mL/min (ref >60)
GFR calc non Af Amer: >60 mL/min (ref >60)
Glucose, Bld: 110 mg/dL — ABNORMAL HIGH (ref 70–99)
Potassium: 4.4 mmol/L (ref 3.5–5.1)
Sodium: 133 mmol/L — ABNORMAL LOW (ref 135–145)

## 2019-08-08 LAB — GLUCOSE, CAPILLARY
Glucose-Capillary: 107 mg/dL — ABNORMAL HIGH (ref 70–99)
Glucose-Capillary: 106 mg/dL — ABNORMAL HIGH (ref 70–99)
Glucose-Capillary: 103 mg/dL — ABNORMAL HIGH (ref 70–99)
Glucose-Capillary: 68 mg/dL — ABNORMAL LOW (ref 70–99)
Glucose-Capillary: 114 mg/dL — ABNORMAL HIGH (ref 70–99)

## 2019-08-08 LAB — PROTIME-INR
INR: 2.3 — ABNORMAL HIGH (ref 0.8–1.2)
Prothrombin Time: 25.1 seconds — ABNORMAL HIGH (ref 11.4–15.2)

## 2019-08-08 LAB — MAGNESIUM: Magnesium: 1.9 mg/dL (ref 1.7–2.4)

## 2019-08-08 SURGERY — CARDIOVERSION
Anesthesia: General

## 2019-08-08 MED ORDER — MAGNESIUM SULFATE 2 GM/50ML IV SOLN
2.0000 g | Freq: Once | INTRAVENOUS | Status: AC
  Administered 2019-08-08: 2 g via INTRAVENOUS
  Filled 2019-08-08: qty 50

## 2019-08-08 NOTE — Progress Notes (Signed)
Hypoglycemic Event  CBG: 68  Treatment: OJ x 2   Symptoms:   Follow-up CBG: Time: CBG Result:   Possible Reasons for Event:   Comments/MD notified:    Nechama Guard

## 2019-08-08 NOTE — Progress Notes (Signed)
ANTICOAGULATION CONSULT NOTE - Initial Consult  Pharmacy Consult for warfarin Indication: atrial fibrillation  No Known Allergies  Patient Measurements: Height: 5\' 9"  (175.3 cm) Weight: 177 lb 12.8 oz (80.6 kg) IBW/kg (Calculated) : 70.7  Vital Signs: Temp: 97.9 F (36.6 C) (01/07 0350) Temp Source: Oral (01/07 0350) BP: 150/69 (01/07 0350) Pulse Rate: 61 (01/07 0352)  Labs: Recent Labs    08/06/19 1135 08/07/19 0406 08/08/19 0348  LABPROT 26.7* 24.1* 25.1*  INR 2.5* 2.2* 2.3*  CREATININE 0.95 0.89 0.91    Estimated Creatinine Clearance: 73.4 mL/min (by C-G formula based on SCr of 0.91 mg/dL).   Medical History: Past Medical History:  Diagnosis Date  . Asthma   . CHF (congestive heart failure) ([place])   . Coronary artery disease   . Diabetes mellitus without complication ([place])   . Ischemic cardiomyopathy   . LBBB (left bundle branch block)   . Persistent atrial fibrillation [facility])    Assessment: 74 year old male being admitted for tikosyn initiation. Patient is on chronic warfarin for afib. INR continues to be therapeutic this am at 2.3. Will continue home dose of 7.5mg  daily.   Goal of Therapy:  INR 2-3 Monitor platelets by anticoagulation protocol: Yes   Plan:  Warfarin 7.5mg  daily INR daily   Erin Hearing PharmD., BCPS Clinical Pharmacist 08/08/2019 8:00 AM

## 2019-08-08 NOTE — Care Management (Signed)
1556 08-08-19 Case Manager did discuss with patient the cost of Tikosyn. Case Manager called Suzie Portela they will order the Tikosyn and have by Monday. Patient will need a Rx for 7 day supply no refills sent to [facility]. Patient will need original Rx with refills sent to [facility]. No further needs from Case Manager at this time. Bethena Roys, RN,BSN Case Manager [phone]

## 2019-08-08 NOTE — Progress Notes (Signed)
EKG post morning Tikosyn dose reviewed. QTc stable when taking into account VP complex.  Chanetta Marshall, NP 08/08/2019 2:57 PM

## 2019-08-08 NOTE — Progress Notes (Signed)
Inpatient Diabetes Program Recommendations  AACE/ADA: New Consensus Statement on Inpatient Glycemic Control   Target Ranges:  Prepandial:   less than 140 mg/dL      Peak postprandial:   less than 180 mg/dL (1-2 hours)      Critically ill patients:  140 - 180 mg/dL   Results for ISAIAHS, FRANKO (MRN [id number]) as of 08/08/2019 10:52  Ref. Range 08/07/2019 06:56 08/07/2019 11:25 08/07/2019 16:42 08/07/2019 20:45 08/07/2019 22:17 08/08/2019 07:50  Glucose-Capillary Latest Ref Range: 70 - 99 mg/dL 115 (H) 95 115 (H) 56 (L) 112 (H) 107 (H)   Review of Glycemic Control  Diabetes history: DM2 Outpatient Diabetes medications: Metformin 1000 mg BID Current orders for Inpatient glycemic control: Metformin 1000 mg BID  Inpatient Diabetes Program Recommendations:   Correction (SSI): While inpatient, please consider ordering CBGs AC&HS with Novolog 0-6 units TID with meals.  Oral Agents: While inpatient, please discontinue Metformin.  Thanks, Barnie Alderman, RN, MSN, CDE Diabetes Coordinator Inpatient Diabetes Program [phone] (Team Pager from 8am to 5pm)

## 2019-08-08 NOTE — Progress Notes (Signed)
Pharmacy: Dofetilide (Tikosyn) - Follow Up Assessment and Electrolyte Replacement  Pharmacy consulted to assist in monitoring and replacing electrolytes in this 74 y.o. male admitted on 08/06/2019 undergoing dofetilide initiation. First dofetilide dose: 08/06/19  Labs:    Component Value Date/Time   K 4.4 08/08/2019 0348   MG 1.9 08/08/2019 0348     Plan: Potassium: K >/= 4: No additional supplementation needed  Magnesium: Mg 1.8-2: Give Mg 2 gm IV x1   Patient has not required potassium supplements this admission. Of note he is on spironolactone which is on hold d/t elevated potassium on admit.   Thank you for allowing pharmacy to participate in this patient's care   Erin Hearing PharmD., BCPS Clinical Pharmacist 08/08/2019 8:00 AM

## 2019-08-08 NOTE — Progress Notes (Signed)
   Progress Note   Subjective   Doing well today, the patient denies CP or SOB.  No new concerns  Inpatient Medications    Scheduled Meds: . aspirin EC  81 mg Oral Daily  . atorvastatin  80 mg Oral QPM  . carvedilol  25 mg Oral BID  . cholecalciferol  2,000 Units Oral Daily  . digoxin  0.125 mg Oral Daily  . dofetilide  500 mcg Oral BID  . irbesartan  150 mg Oral Daily  . ketotifen  1 drop Both Eyes Daily  . magnesium oxide  400 mg Oral BID  . metFORMIN  1,000 mg Oral BID WC  . omega-3 acid ethyl esters  1 g Oral Daily  . sodium chloride flush  3 mL Intravenous Q12H  . torsemide  20 mg Oral Daily  . warfarin  7.5 mg Oral q1800  . Warfarin - Pharmacist Dosing Inpatient   Does not apply q1800   Continuous Infusions: . sodium chloride    . magnesium sulfate bolus IVPB     PRN Meds: sodium chloride, acetaminophen, sodium chloride flush   Vital Signs    Vitals:   08/07/19 1331 08/07/19 2042 08/08/19 0350 08/08/19 0352  BP: 119/60 (!) 140/59 (!) 150/69   Pulse: (!) 59 60 (!) 59 61  Resp:    17  Temp: (!) 97.5 F (36.4 C) 98 F (36.7 C) 97.9 F (36.6 C)   TempSrc: Oral Oral Oral   SpO2: 99% 98%  98%  Weight:    80.6 kg  Height:        Intake/Output Summary (Last 24 hours) at 08/08/2019 0812 Last data filed at 08/07/2019 1300 Gross per 24 hour  Intake 240 ml  Output --  Net 240 ml   Filed Weights   08/06/19 1528 08/07/19 0416 08/08/19 0352  Weight: 81.7 kg 80.2 kg 80.6 kg    Telemetry    AV paced - Personally Reviewed  Physical Exam   GEN- The patient is well appearing, alert and oriented x 3 today.   Head- normocephalic, atraumatic Eyes-  Sclera clear, conjunctiva pink Ears- hearing intact Oropharynx- clear Neck- supple, Lungs-  normal work of breathing Heart- Regular rate and rhythm  GI- soft  Extremities- no clubbing, cyanosis, or edema  MS- no significant deformity or atrophy Skin- no rash or lesion Psych- euthymic mood, full affect Neuro-  strength and sensation are intact   Labs    Chemistry Recent Labs  Lab 08/06/19 1135 08/07/19 0406 08/08/19 0348  NA 128* 132* 133*  K 5.4* 4.5 4.4  CL 93* 97* 97*  CO2 24 26 29   GLUCOSE 136* 111* 110*  BUN 16 23 20   CREATININE 0.95 0.89 0.91  CALCIUM 9.1 8.8* 8.8*  GFRNONAA >60 >60 >60  GFRAA >60 >60 >60  ANIONGAP 11 9 7      HematologyNo results for input(s): WBC, RBC, HGB, HCT, MCV, MCH, MCHC, RDW, PLT in the last 168 hours.  Cardiac EnzymesNo results for input(s): TROPONINI in the last 168 hours. No results for input(s): TROPIPOC in the last 168 hours.      Assessment & Plan    1.  Persistent afib Doing well with tikosyn Qt is stable at 515 msec (paced V) Continue coumadin for chads2vasc score of 5  2. CAD No ischemic symptoms  Anticipate discharge to home tomorrow  Thompson Grayer MD, [facility] 08/08/2019 8:12 AM

## 2019-08-09 LAB — MAGNESIUM: Magnesium: 2 mg/dL (ref 1.7–2.4)

## 2019-08-09 LAB — BASIC METABOLIC PANEL
Anion gap: 8 (ref 5–15)
BUN: 14 mg/dL (ref 8–23)
CO2: 26 mmol/L (ref 22–32)
Calcium: 8.8 mg/dL — ABNORMAL LOW (ref 8.9–10.3)
Chloride: 98 mmol/L (ref 98–111)
Creatinine, Ser: 0.8 mg/dL (ref 0.61–1.24)
GFR calc Af Amer: >60 mL/min (ref >60)
GFR calc non Af Amer: >60 mL/min (ref >60)
Glucose, Bld: 95 mg/dL (ref 70–99)
Potassium: 4.6 mmol/L (ref 3.5–5.1)
Sodium: 132 mmol/L — ABNORMAL LOW (ref 135–145)

## 2019-08-09 LAB — GLUCOSE, CAPILLARY
Glucose-Capillary: 96 mg/dL (ref 70–99)
Glucose-Capillary: 88 mg/dL (ref 70–99)

## 2019-08-09 LAB — PROTIME-INR
INR: 2.5 — ABNORMAL HIGH (ref 0.8–1.2)
Prothrombin Time: 27.2 seconds — ABNORMAL HIGH (ref 11.4–15.2)

## 2019-08-09 MED ORDER — DOFETILIDE 500 MCG PO CAPS: 500.0000 ug | ORAL_CAPSULE | Freq: Two times a day (BID) | ORAL | 6 refills | Status: DC

## 2019-08-09 MED ORDER — SPIRONOLACTONE 25 MG PO TABS: 12.5000 mg | ORAL_TABLET | Freq: Every day | ORAL | 6 refills | Status: DC

## 2019-08-09 MED FILL — DOFETILIDE 500 MCG CAPS: 500 | 30 days supply | Qty: 60 | Fill #0

## 2019-08-09 MED FILL — SPIRONOLACTONE 25 MG TABLET: 25 | 30 days supply | Qty: 15 | Fill #0

## 2019-08-09 NOTE — Progress Notes (Signed)
ANTICOAGULATION CONSULT NOTE   Pharmacy Consult for warfarin Indication: atrial fibrillation  No Known Allergies  Patient Measurements: Height: 5\' 9"  (175.3 cm) Weight: 178 lb 6.4 oz (80.9 kg) IBW/kg (Calculated) : 70.7  Vital Signs: Temp: 98.4 F (36.9 C) (01/08 0429) BP: 165/67 (01/08 0820) Pulse Rate: 60 (01/08 0429)  Labs: Recent Labs    08/07/19 0406 08/08/19 0348 08/09/19 0332  LABPROT 24.1* 25.1* 27.2*  INR 2.2* 2.3* 2.5*  CREATININE 0.89 0.91 0.80    Estimated Creatinine Clearance: 83.5 mL/min (by C-G formula based on SCr of 0.8 mg/dL).   Medical History: Past Medical History:  Diagnosis Date  . Asthma   . CHF (congestive heart failure) ([place])   . Coronary artery disease   . Diabetes mellitus without complication ([place])   . Ischemic cardiomyopathy   . LBBB (left bundle branch block)   . Persistent atrial fibrillation [facility])    Assessment: 74 year old male being admitted for tikosyn initiation. Patient is on chronic warfarin for afib. INR continues to be therapeutic this am at 2.5. Will continue home dose of 7.5mg  daily.   Goal of Therapy:  INR 2-3 Monitor platelets by anticoagulation protocol: Yes   Plan:  Warfarin 7.5mg  daily INR daily   Erin Hearing PharmD., BCPS Clinical Pharmacist 08/09/2019 11:55 AM

## 2019-08-09 NOTE — Progress Notes (Signed)
Telemetry is AV paced VSS QT stable and OK for this AM dose allowing for paced QRS duration 136ms Labs OK  BS 68 last yesterday.  I suspect his diet here is different then at home.   He has daily AM BS from home, none are hypoglycemic. We discussed monitoring BS BID at home and following up with his PMD if noted at home as well  Appreciate case management, no PA needed for dofetilide I anticipate discharge later today  Tommye Standard, PA-C

## 2019-08-09 NOTE — Discharge Summary (Signed)
ELECTROPHYSIOLOGY PROCEDURE DISCHARGE SUMMARY    Patient ID: Brian Bruce,  MRN: [id number], DOB/AGE: 1945-12-21 74 y.o.  Admit date: 08/06/2019 Discharge date: 08/09/2019  Primary Care Physician: System, Pcp Not In  Primary Cardiologist: Dr Dot Lanes Electrophysiologist: Dr. Rayann Heman  Primary Discharge Diagnosis:  1.  Persistent atrial fibrillation status post Tikosyn loading this admission  Secondary Discharge Diagnosis:  1. CAD (CABG 1998) 2. DM 3. ICM 4. LBBB     CRT-D in place 5. Chronic CHF (systolic)     Compensated curently  No Known Allergies   Procedures This Admission:  1.  Tikosyn loading    Brief HPI: Brian Bruce is a 74 y.o. male with a past medical history as noted above.  They were referred to EP in the outpatient setting for treatment options of atrial fibrillation.  Risks, benefits, and alternatives to Tikosyn were reviewed with the patient who wished to proceed.    Hospital Course:  The patient was admitted and Tikosyn was initiated.  Renal function and electrolytes were followed during the hospitalization.  His QTc remained stable.  He coardioverted with drug and did not require DCCV.  His electrolytes stable, taking mag ox at home, no need for additional replacement.  His home aldactone was held on admission for K+ of 5.4  Will discharge him on 1/2 home dose with his home torsemide and meds unchanged otherwise.  He will have a BMET in 1 week.  The patient was monitored until discharge on telemetry which demonstrated [facility] pacing.  On the day of discharge, the patient feels well, , Dr Rayann Heman considered them stable for discharge to home.  Follow-up has been arranged with the AFib clinic in 1 week and with EP APP in 4 weeks.   Note, on 2 occassions the pt was noted to have CBG low, he is only on metformin for his DM.  He showed me home glucose levels, none hypoglycemia, all taken in the AM prior to his meds.   He denies any kind of symptoms of hypoglycemia  and suspect his eating habits here are different then at home. He has been asked to monitor his BS twice daily and if he observes low numbers to discuss with his PMD his metformin, DM management, and discussed symptoms of hypoglycemia, intervention with orange juice (or something similar/sweet) he was aware, and comfortable with the plan.     Physical Exam: Vitals:   08/08/19 1508 08/08/19 2030 08/09/19 0429 08/09/19 0820  BP: (!) 146/63 (!) 149/60 (!) 169/68 (!) 165/67  Pulse: 60 60 60   Resp:  16 16   Temp: 98.2 F (36.8 C) 97.6 F (36.4 C) 98.4 F (36.9 C)   TempSrc: Oral Oral    SpO2: 100% 96% 99%   Weight:   80.9 kg   Height:        GEN- The patient is well appearing, alert and oriented x 3 today.   HEENT: normocephalic, atraumatic; sclera clear, conjunctiva pink; hearing intact; oropharynx clear; neck supple, no JVP Lymph- no cervical lymphadenopathy Lungs- CTA b/l, normal work of breathing.  No wheezes, rales, rhonchi Heart- RRR, no murmurs, rubs or gallops, PMI not laterally displaced GI- soft, non-tender, non-distended Extremities- no clubbing, cyanosis, or edema MS- no significant deformity or atrophy Skin- warm and dry, no rash or lesion Psych- euthymic mood, full affect Neuro- strength and sensation are intact   Labs:   Lab Results  Component Value Date   WBC 5.6 05/13/2019  HGB 12.0 (L) 05/13/2019   HCT 35.0 (L) 05/13/2019   MCV 95.9 05/13/2019   PLT 261 05/13/2019    Recent Labs  Lab 08/09/19 0332  NA 132*  K 4.6  CL 98  CO2 26  BUN 14  CREATININE 0.80  CALCIUM 8.8*  GLUCOSE 95     Discharge Medications:  Allergies as of 08/09/2019   No Known Allergies     Medication List    TAKE these medications   acetaminophen 500 MG tablet Commonly known as: TYLENOL Take 500 mg by mouth every 6 (six) hours as needed for moderate pain.   [place] 0.025 % ophthalmic solution Generic drug: ketotifen Place 1 drop into both eyes daily.   aspirin EC 81  MG tablet Take 81 mg by mouth daily.   atorvastatin 80 MG tablet Commonly known as: LIPITOR Take 80 mg by mouth every evening.   augmented betamethasone dipropionate 0.05 % cream Commonly known as: DIPROLENE-AF Apply 1 application topically 2 (two) times daily as needed (rash).   candesartan 16 MG tablet Commonly known as: ATACAND Take 16 mg by mouth daily.   carvedilol 25 MG tablet Commonly known as: COREG Take 25 mg by mouth 2 (two) times daily.   Co Q-10 100 MG Caps Take 100 mg by mouth daily.   digoxin 0.125 MG tablet Commonly known as: LANOXIN Take 0.125 mg by mouth daily.   dofetilide 500 MCG capsule Commonly known as: TIKOSYN Take 1 capsule (500 mcg total) by mouth 2 (two) times daily.   Fish Oil 1000 MG Caps Take 1,000 mg by mouth daily.   GLUCOSAMINE-CHONDROITIN PO Take 1 tablet by mouth 2 (two) times daily.   magnesium oxide 400 (241.3 Mg) MG tablet Commonly known as: MAG-OX Take 400 mg by mouth 2 (two) times daily.   metFORMIN 1000 MG tablet Commonly known as: GLUCOPHAGE Take 1 tablet (1,000 mg total) by mouth 2 (two) times daily.   spironolactone 25 MG tablet Commonly known as: Aldactone Take 0.5 tablets (12.5 mg total) by mouth daily. What changed:   medication strength  how much to take   torsemide 20 MG tablet Commonly known as: DEMADEX Take 1 tablet (20 mg total) by mouth daily.   Vitamin D 50 MCG (2000 UT) tablet Take 2,000 Units by mouth daily.   warfarin 5 MG tablet Commonly known as: COUMADIN Take 7.5 mg by mouth daily.       Disposition: Home Discharge Instructions    Diet - low sodium heart healthy   Complete by: As directed    Increase activity slowly   Complete by: As directed      Follow-up Information    [facility] [place] Follow up.   Specialty: Cardiology Why: 08/16/2019 @ 10:30AM Contact information: [address] [id number] [place]        Baldwin [place], PA-C Follow up.   Specialty: Cardiology Why: 09/09/2019 @ 2:30PM Contact information: [place] [address] [phone]        Primary care physician,Dr Stoney Bang Follow up.   Why: Continue weekly INR (warfarin checks) weekly for next 4 weeks, then as directed by them          Duration of Discharge Encounter: Greater than 30 minutes including physician time.  Brian Night, PA-C 08/09/2019 11:58 AM

## 2019-08-09 NOTE — Progress Notes (Signed)
I spoke at length with the patient this am.  He is doing well and eager to go home.  No arrhythmias on telemetry.  Qt is very stable and suitable for discharge.  Ok to discharge with close follow-up in the AF clinic.  Thompson Grayer MD, [place] 08/09/2019 10:53 AM

## 2019-08-09 NOTE — Discharge Instructions (Signed)

## 2019-08-09 NOTE — Progress Notes (Signed)
Pharmacy: Dofetilide (Tikosyn) - Follow Up Assessment and Electrolyte Replacement  Pharmacy consulted to assist in monitoring and replacing electrolytes in this 74 y.o. male admitted on 08/06/2019 undergoing dofetilide initiation. First dofetilide dose: 08/06/19  Labs:    Component Value Date/Time   K 4.6 08/09/2019 0332   MG 2.0 08/09/2019 0332     Plan: Potassium: K >/= 4: No additional supplementation needed  Magnesium: Mg 1.8-2: Give Mg 2 gm IV x1   Patient has not required potassium supplements this admission and do not expect him to need them on discharge. Of note he is on spironolactone which is on hold d/t elevated potassium on admit.   Thank you for allowing pharmacy to participate in this patient's care   Erin Hearing PharmD., BCPS Clinical Pharmacist 08/09/2019 11:52 AM

## 2019-08-14 DIAGNOSIS — Z7901 Long term (current) use of anticoagulants: Secondary | ICD-10-CM | POA: Diagnosis not present

## 2019-08-15 ENCOUNTER — Ambulatory Visit (INDEPENDENT_AMBULATORY_CARE_PROVIDER_SITE_OTHER): Payer: Medicare Other | Admitting: *Deleted

## 2019-08-15 DIAGNOSIS — I5022 Chronic systolic (congestive) heart failure: Secondary | ICD-10-CM

## 2019-08-15 LAB — CUP PACEART REMOTE DEVICE CHECK
Date Time Interrogation Session: 20210114082139
Implantable Lead Implant Date: 20201015
Implantable Lead Implant Date: 20201015
Implantable Lead Implant Date: 20201015
Implantable Lead Location: 753858
Implantable Lead Location: 753859
Implantable Lead Location: 753860
Implantable Pulse Generator Implant Date: 20201015
Pulse Gen Serial Number: 111006847

## 2019-08-16 ENCOUNTER — Other Ambulatory Visit (HOSPITAL_COMMUNITY): Payer: Self-pay | Admitting: *Deleted

## 2019-08-16 ENCOUNTER — Other Ambulatory Visit: Payer: Self-pay

## 2019-08-16 ENCOUNTER — Encounter (HOSPITAL_COMMUNITY): Payer: Self-pay | Admitting: Physician Assistant

## 2019-08-16 ENCOUNTER — Ambulatory Visit (HOSPITAL_COMMUNITY)
Admission: RE | Admit: 2019-08-16 | Discharge: 2019-08-16 | Disposition: A | Discharge status: Home / Self Care | Payer: Medicare Other | Source: Ambulatory Visit | Attending: Physician Assistant | Admitting: Physician Assistant

## 2019-08-16 VITALS — BP 122/54 | HR 60 | Ht 69.0 in | Wt 182.2 lb

## 2019-08-16 DIAGNOSIS — I5022 Chronic systolic (congestive) heart failure: Secondary | ICD-10-CM | POA: Diagnosis not present

## 2019-08-16 DIAGNOSIS — I447 Left bundle-branch block, unspecified: Secondary | ICD-10-CM | POA: Diagnosis not present

## 2019-08-16 DIAGNOSIS — I255 Ischemic cardiomyopathy: Secondary | ICD-10-CM | POA: Insufficient documentation

## 2019-08-16 DIAGNOSIS — I4819 Other persistent atrial fibrillation: Secondary | ICD-10-CM | POA: Diagnosis not present

## 2019-08-16 DIAGNOSIS — Z950 Presence of cardiac pacemaker: Secondary | ICD-10-CM | POA: Diagnosis not present

## 2019-08-16 DIAGNOSIS — I25810 Atherosclerosis of coronary artery bypass graft(s) without angina pectoris: Secondary | ICD-10-CM | POA: Diagnosis not present

## 2019-08-16 DIAGNOSIS — R9431 Abnormal electrocardiogram [ECG] [EKG]: Secondary | ICD-10-CM | POA: Diagnosis not present

## 2019-08-16 DIAGNOSIS — Z7901 Long term (current) use of anticoagulants: Secondary | ICD-10-CM | POA: Insufficient documentation

## 2019-08-16 DIAGNOSIS — Z951 Presence of aortocoronary bypass graft: Secondary | ICD-10-CM | POA: Insufficient documentation

## 2019-08-16 DIAGNOSIS — Z8249 Family history of ischemic heart disease and other diseases of the circulatory system: Secondary | ICD-10-CM | POA: Diagnosis not present

## 2019-08-16 DIAGNOSIS — Z7982 Long term (current) use of aspirin: Secondary | ICD-10-CM | POA: Insufficient documentation

## 2019-08-16 DIAGNOSIS — J45909 Unspecified asthma, uncomplicated: Secondary | ICD-10-CM | POA: Insufficient documentation

## 2019-08-16 DIAGNOSIS — Z809 Family history of malignant neoplasm, unspecified: Secondary | ICD-10-CM | POA: Diagnosis not present

## 2019-08-16 DIAGNOSIS — D6869 Other thrombophilia: Secondary | ICD-10-CM | POA: Diagnosis not present

## 2019-08-16 DIAGNOSIS — E119 Type 2 diabetes mellitus without complications: Secondary | ICD-10-CM | POA: Insufficient documentation

## 2019-08-16 DIAGNOSIS — Z87891 Personal history of nicotine dependence: Secondary | ICD-10-CM | POA: Insufficient documentation

## 2019-08-16 DIAGNOSIS — Z79899 Other long term (current) drug therapy: Secondary | ICD-10-CM | POA: Insufficient documentation

## 2019-08-16 DIAGNOSIS — Z7984 Long term (current) use of oral hypoglycemic drugs: Secondary | ICD-10-CM | POA: Insufficient documentation

## 2019-08-16 DIAGNOSIS — E875 Hyperkalemia: Secondary | ICD-10-CM | POA: Insufficient documentation

## 2019-08-16 LAB — BASIC METABOLIC PANEL
Anion gap: 9 (ref 5–15)
BUN: 14 mg/dL (ref 8–23)
CO2: 27 mmol/L (ref 22–32)
Calcium: 9.6 mg/dL (ref 8.9–10.3)
Chloride: 94 mmol/L — ABNORMAL LOW (ref 98–111)
Creatinine, Ser: 0.81 mg/dL (ref 0.61–1.24)
GFR calc Af Amer: >60 mL/min (ref >60)
GFR calc non Af Amer: >60 mL/min (ref >60)
Glucose, Bld: 126 mg/dL — ABNORMAL HIGH (ref 70–99)
Potassium: 5.1 mmol/L (ref 3.5–5.1)
Sodium: 130 mmol/L — ABNORMAL LOW (ref 135–145)

## 2019-08-16 LAB — MAGNESIUM: Magnesium: 1.8 mg/dL (ref 1.7–2.4)

## 2019-08-16 NOTE — Progress Notes (Signed)
Primary Care Physician: Dr Stoney Bang Primary Cardiologist: Dr Dot Lanes Primary Electrophysiologist: Dr Rayann Heman Referring Physician: Tommye Standard PA   Brian Bruce is a 74 y.o. male with a history of CAD (CABG 1998), DM, persistent Afib, ICM s/p ICD, chronic CHF (systolic), LBBB who presents for follow up in the [facility]. Patient seen in EP clinic post ICD implant with persistent afib. He is on warfarin for a CHADS2VASC score of 4. Patient would like to pursue Tikosyn. He is fairly unaware of his arrhythmia with no increased SOB, fatigue, palpitations, or heart racing. He denies significant snoring or alcohol use.  On follow up today, patient is s/p dofetilide loading 1/5-08/09/19. Patient reports doing very well. He converted to SR on the medication and did not require DCCV. He is tolerating the medication without difficulty.   Today, he denies symptoms of palpitations, chest pain, shortness of breath, orthopnea, PND, lower extremity edema, dizziness, presyncope, syncope, snoring, daytime somnolence, bleeding, or neurologic sequela. The patient is tolerating medications without difficulties and is otherwise without complaint today.    Atrial Fibrillation Risk Factors:  he does not have symptoms or diagnosis of sleep apnea. he does not have a history of rheumatic fever.   he has a BMI of Body mass index is 26.91 kg/m.Marland Kitchen Filed Weights   08/16/19 1026  Weight: 82.6 kg    Family History  Problem Relation Age of Onset  . Cancer Mother   . Heart failure Father      Atrial Fibrillation Management history:  Previous antiarrhythmic drugs: dofetilide Previous cardioversions: none Previous ablations: none CHADS2VASC score: 4 Anticoagulation history: warfarin    Past Medical History:  Diagnosis Date  . Asthma   . CHF (congestive heart failure) ([name])   . Coronary artery disease   . Diabetes mellitus without complication ([place])   . Ischemic  cardiomyopathy   . LBBB (left bundle branch block)   . Persistent atrial fibrillation [facility])    Past Surgical History:  Procedure Laterality Date  . BIV ICD INSERTION CRT-D N/A 05/16/2019   Procedure: BIV ICD INSERTION CRT-D;  Surgeon: Thompson Grayer, MD;  Location: [place] [facility];  Service: Cardiovascular;  Laterality: N/A;  . CORONARY ARTERY BYPASS GRAFT  1998   4-vessel  . RIGHT/LEFT HEART CATH AND CORONARY/GRAFT ANGIOGRAPHY N/A 03/07/2019   Procedure: RIGHT/LEFT HEART CATH AND CORONARY/GRAFT ANGIOGRAPHY;  Surgeon: Nelva Bush, MD;  Location: [place] [facility];  Service: Cardiovascular;  Laterality: N/A;    Current Outpatient Medications  Medication Sig Dispense Refill  . acetaminophen (TYLENOL) 500 MG tablet Take 500 mg by mouth every 6 (six) hours as needed for moderate pain.    Marland Kitchen aspirin EC 81 MG tablet Take 81 mg by mouth daily.    Marland Kitchen atorvastatin (LIPITOR) 80 MG tablet Take 80 mg by mouth every evening.    Marland Kitchen augmented betamethasone dipropionate (DIPROLENE-AF) 0.05 % cream Apply 1 application topically 2 (two) times daily as needed (rash).    . candesartan (ATACAND) 16 MG tablet Take 16 mg by mouth daily.    . carvedilol (COREG) 25 MG tablet Take 25 mg by mouth 2 (two) times daily.     . Cholecalciferol (VITAMIN D) 50 MCG (2000 UT) tablet Take 2,000 Units by mouth daily.    . Coenzyme Q10 (CO Q-10) 100 MG CAPS Take 100 mg by mouth daily.    . digoxin (LANOXIN) 0.125 MG tablet Take 0.125 mg by mouth daily.    Marland Kitchen  dofetilide (TIKOSYN) 500 MCG capsule Take 1 capsule (500 mcg total) by mouth 2 (two) times daily. 60 capsule 6  . GLUCOSAMINE-CHONDROITIN PO Take 1 tablet by mouth 2 (two) times daily.    Marland Kitchen ketotifen (ALAWAY) 0.025 % ophthalmic solution Place 1 drop into both eyes daily.    . magnesium oxide (MAG-OX) 400 (241.3 Mg) MG tablet Take 400 mg by mouth 2 (two) times daily.     . metFORMIN (GLUCOPHAGE) 1000 MG tablet Take 1 tablet (1,000 mg total) by mouth 2 (two) times daily.     . Omega-3 Fatty Acids (FISH OIL) 1000 MG CAPS Take 1,000 mg by mouth daily.    Marland Kitchen spironolactone (ALDACTONE) 25 MG tablet Take 0.5 tablets (12.5 mg total) by mouth daily. 15 tablet 6  . torsemide (DEMADEX) 20 MG tablet Take 1 tablet (20 mg total) by mouth daily.    Marland Kitchen warfarin (COUMADIN) 5 MG tablet Take 7.5 mg by mouth daily.      No current facility-administered medications for this encounter.    No Known Allergies  Social History   Socioeconomic History  . Marital status: Married    Spouse name: Not on file  . Number of children: Not on file  . Years of education: Not on file  . Highest education level: Not on file  Occupational History  . Not on file  Tobacco Use  . Smoking status: Former Smoker    Types: Cigarettes    Quit date: 1980    Years since quitting: 41.0  . Smokeless tobacco: Never Used  Substance and Sexual Activity  . Alcohol use: Yes    Alcohol/week: 2.0 - 3.0 standard drinks    Types: 2 - 3 Cans of beer per week    Comment: every evening  . Drug use: Never  . Sexual activity: Not on file  Other Topics Concern  . Not on file  Social History Narrative   Lives in [place] with spouse.  Retired.   Previously worked as a Designer, television/film set:   . Difficulty of Paying Living Expenses: Not on file  Food Insecurity:   . Worried About Charity fundraiser in the Last Year: Not on file  . Ran Out of Food in the Last Year: Not on file  Transportation Needs:   . Lack of Transportation (Medical): Not on file  . Lack of Transportation (Non-Medical): Not on file  Physical Activity:   . Days of Exercise per Week: Not on file  . Minutes of Exercise per Session: Not on file  Stress:   . Feeling of Stress : Not on file  Social Connections:   . Frequency of Communication with Friends and Family: Not on file  . Frequency of Social Gatherings with Friends and Family: Not on file  . Attends Religious  Services: Not on file  . Active Member of Clubs or Organizations: Not on file  . Attends Archivist Meetings: Not on file  . Marital Status: Not on file  Intimate Partner Violence:   . Fear of Current or Ex-Partner: Not on file  . Emotionally Abused: Not on file  . Physically Abused: Not on file  . Sexually Abused: Not on file     ROS- All systems are reviewed and negative except as per the HPI above.  Physical Exam: Vitals:   08/16/19 1026  BP: (!) 122/54  Pulse: 60  Weight: 82.6 kg  Height: 5'  9" (1.753 m)    GEN- The patient is well appearing, alert and oriented x 3 today.   HEENT-head normocephalic, atraumatic, sclera clear, conjunctiva pink, hearing intact, trachea midline. Lungs- Clear to ausculation bilaterally, normal work of breathing Heart- Regular rate and rhythm, no murmurs, rubs or gallops  GI- soft, NT, ND, + BS Extremities- no clubbing, cyanosis, or edema MS- no significant deformity or atrophy Skin- no rash or lesion Psych- euthymic mood, full affect Neuro- strength and sensation are intact   Wt Readings from Last 3 Encounters:  08/16/19 82.6 kg  08/09/19 80.9 kg  08/06/19 82.3 kg    EKG today demonstrates AV paced rhythm HR 60, PR 188, QRS 152, QTc 480  Echo 11/23/18 demonstrated  Moderately dilated left ventricle with severely reduced LVEF. Moderately reduced RV with moderately reduced contraction. No significant pericardial effusion. Moderate to severe mitral regurgitation  Epic records are reviewed at length today  Assessment and Plan:  1. Persistent atrial fibrillation Patient is s/p dofetilide loading 1/5-08/09/19. He appears to be maintaining SR. Continue dofetilide 500 mcg BID. QT stable. Check bmet/mag today. Continue warfarin  This patients CHA2DS2-VASc Score and unadjusted Ischemic Stroke Rate (% per year) is equal to 4.8 % stroke rate/year from a score of 4  Above score calculated as 1 point each if present [CHF, HTN,  DM, Vascular=MI/PAD/Aortic Plaque, Age if 65-74, or Male] Above score calculated as 2 points each if present [Age > 75, or Stroke/TIA/TE]   2. CAD No anginal symptoms.  3. ICM/Chronic systolic CHF S/p ICD, followed by Dr Rayann Heman and the device clinic. No signs or symptoms of fluid overload. Continue present therapy.  4. Hyperkalemia 5.4 on admission. Spironolactone held and then resumed at lower dose 12.5 mg. Recheck bmet as above.   Follow up with Dr Rayann Heman and Tommye Standard as scheduled.    [facility] [address] [place], [place] [address] [phone] 08/16/2019 10:52 AM

## 2019-08-16 NOTE — Progress Notes (Signed)
ICD remote 

## 2019-08-19 ENCOUNTER — Telehealth: Payer: Self-pay

## 2019-08-19 NOTE — Telephone Encounter (Signed)
I spoke to the patient who called because he forgot to take his morning dose of Tikosyn 500 mcg.  I told him to take a dose now and resume as prescribed in the AM.  He verbalized understanding.

## 2019-08-21 ENCOUNTER — Telehealth (INDEPENDENT_AMBULATORY_CARE_PROVIDER_SITE_OTHER): Payer: Medicare Other | Admitting: Internal Medicine

## 2019-08-21 ENCOUNTER — Other Ambulatory Visit: Payer: Self-pay

## 2019-08-21 DIAGNOSIS — I447 Left bundle-branch block, unspecified: Secondary | ICD-10-CM | POA: Diagnosis not present

## 2019-08-21 DIAGNOSIS — Z7901 Long term (current) use of anticoagulants: Secondary | ICD-10-CM | POA: Diagnosis not present

## 2019-08-21 DIAGNOSIS — D6869 Other thrombophilia: Secondary | ICD-10-CM | POA: Diagnosis not present

## 2019-08-21 DIAGNOSIS — I5022 Chronic systolic (congestive) heart failure: Secondary | ICD-10-CM | POA: Diagnosis not present

## 2019-08-21 DIAGNOSIS — I4819 Other persistent atrial fibrillation: Secondary | ICD-10-CM

## 2019-08-21 DIAGNOSIS — I255 Ischemic cardiomyopathy: Secondary | ICD-10-CM | POA: Diagnosis not present

## 2019-08-21 NOTE — Progress Notes (Signed)
Electrophysiology TeleHealth Note  Due to national recommendations of social distancing due to [place] 19, an audio telehealth visit is felt to be most appropriate for this patient at this time.  Verbal consent was obtained by me for the telehealth visit today.  The patient does not have capability for a virtual visit.  A phone visit is therefore required today.   Date:  08/21/2019   ID:  Brian Bruce, DOB 03/28/1946, MRN [id number]  Location: patient's home  Provider location:  [facility]  Evaluation Performed: Follow-up visit  PCP:  System, Pcp Not In   Electrophysiologist:  Dr Rayann Heman  Chief Complaint:  palpitations  History of Present Illness:    Brian Bruce is a 74 y.o. male who presents via telehealth conferencing today.  Since last being seen in our clinic, the patient reports doing very well.  He remains in sinus since recent initiation of tikosyn.  Today, he denies symptoms of palpitations, chest pain, shortness of breath,  lower extremity edema, dizziness, presyncope, or syncope.  The patient is otherwise without complaint today.  The patient denies symptoms of fevers, chills, cough, or new SOB worrisome for COVID 19.  Past Medical History:  Diagnosis Date  . Asthma   . CHF (congestive heart failure) ([place])   . Coronary artery disease   . Diabetes mellitus without complication ([place])   . Ischemic cardiomyopathy   . LBBB (left bundle branch block)   . Persistent atrial fibrillation [facility])     Past Surgical History:  Procedure Laterality Date  . BIV ICD INSERTION CRT-D N/A 05/16/2019   Procedure: BIV ICD INSERTION CRT-D;  Surgeon: Thompson Grayer, MD;  Location: [place] [facility];  Service: Cardiovascular;  Laterality: N/A;  . CORONARY ARTERY BYPASS GRAFT  1998   4-vessel  . RIGHT/LEFT HEART CATH AND CORONARY/GRAFT ANGIOGRAPHY N/A 03/07/2019   Procedure: RIGHT/LEFT HEART CATH AND CORONARY/GRAFT ANGIOGRAPHY;  Surgeon: Nelva Bush, MD;  Location: [place] [facility]  [facility];  Service: Cardiovascular;  Laterality: N/A;    Current Outpatient Medications  Medication Sig Dispense Refill  . acetaminophen (TYLENOL) 500 MG tablet Take 500 mg by mouth every 6 (six) hours as needed for moderate pain.    Marland Kitchen aspirin EC 81 MG tablet Take 81 mg by mouth daily.    Marland Kitchen atorvastatin (LIPITOR) 80 MG tablet Take 80 mg by mouth every evening.    Marland Kitchen augmented betamethasone dipropionate (DIPROLENE-AF) 0.05 % cream Apply 1 application topically 2 (two) times daily as needed (rash).    . candesartan (ATACAND) 16 MG tablet Take 16 mg by mouth daily.    . carvedilol (COREG) 25 MG tablet Take 25 mg by mouth 2 (two) times daily.     . Cholecalciferol (VITAMIN D) 50 MCG (2000 UT) tablet Take 2,000 Units by mouth daily.    . Coenzyme Q10 (CO Q-10) 100 MG CAPS Take 100 mg by mouth daily.    . digoxin (LANOXIN) 0.125 MG tablet Take 0.125 mg by mouth daily.    Marland Kitchen dofetilide (TIKOSYN) 500 MCG capsule Take 1 capsule (500 mcg total) by mouth 2 (two) times daily. 60 capsule 6  . GLUCOSAMINE-CHONDROITIN PO Take 1 tablet by mouth 2 (two) times daily.    Marland Kitchen ketotifen (ALAWAY) 0.025 % ophthalmic solution Place 1 drop into both eyes daily.    . magnesium oxide (MAG-OX) 400 (241.3 Mg) MG tablet Take 400 mg by mouth 2 (two) times daily.     . metFORMIN (GLUCOPHAGE) 1000 MG  tablet Take 1 tablet (1,000 mg total) by mouth 2 (two) times daily.    . Omega-3 Fatty Acids (FISH OIL) 1000 MG CAPS Take 1,000 mg by mouth daily.    Marland Kitchen torsemide (DEMADEX) 20 MG tablet Take 1 tablet (20 mg total) by mouth daily.    Marland Kitchen warfarin (COUMADIN) 5 MG tablet Take 7.5 mg by mouth daily.      No current facility-administered medications for this visit.    Allergies:   Patient has no known allergies.   Social History:  The patient  reports that he quit smoking about 41 years ago. His smoking use included cigarettes. He has never used smokeless tobacco. He reports current alcohol use of about 2.0 - 3.0 standard drinks of alcohol  per week. He reports that he does not use drugs.   Family History:  The patient's  family history includes Cancer in his mother; Heart failure in his father.   ROS:  Please see the history of present illness.   All other systems are personally reviewed and negative.    Exam:    Vital Signs:  There were no vitals taken for this visit.  Well sounding, alert and conversant  Labs/Other Tests and Data Reviewed:    Recent Labs: 05/13/2019: Hemoglobin 12.0; Platelets 261 08/16/2019: BUN 14; Creatinine, Ser 0.81; Magnesium 1.8; Potassium 5.1; Sodium 130   Wt Readings from Last 3 Encounters:  08/16/19 182 lb 3.2 oz (82.6 kg)  08/09/19 178 lb 6.4 oz (80.9 kg)  08/06/19 181 lb 6.4 oz (82.3 kg)     Last device remote is reviewed from [place] PDF which reveals normal device function, no arrhythmias    ASSESSMENT & PLAN:    1.  Persistent afib Doing well with tikosyn Recent seen in AF clinic (notes reviewed) No changes today He is on coumadin for chads2vasc score of 4  2. Ischemic CM/ CAD/ chronic systolic dysfunction No ischemic symptoms No CHF symptoms ICD remotes are uptodate Reassess EF in 6 months   Follow-up:  AF clinic in 3 months I will see in 6 months   Patient Risk:  after full review of this patients clinical status, I feel that they are at moderate risk at this time.  Today, I have spent 15 minutes with the patient with telehealth technology discussing arrhythmia management .    SignedThompson Grayer, MD  08/21/2019 2:12 PM     [place] [address] [phone] (office) [phone] (fax)

## 2019-09-02 DIAGNOSIS — Z7901 Long term (current) use of anticoagulants: Secondary | ICD-10-CM | POA: Diagnosis not present

## 2019-09-06 ENCOUNTER — Telehealth: Payer: Self-pay

## 2019-09-06 NOTE — Telephone Encounter (Signed)
Patient referred to [facility] clinic by Dr Rayann Heman.  ICM intro given and patient agreed to monthly follow up.  He reports feeling fine at this time.  He reports he can usually tell when he has fluid because his ankles will swelling but denies swelling at this time.  Advised remote transmission shows fluid update through 2/4 and suggests possible fluid accumulation since 08/17/19.  He is compliant with meds and denies missing Torsemide dosages.  He eats restaurant foods about half the time but is aware he should monitor salt intake.  If cooking at home he will rinse canned vegetables.  Discussed too much dietary salt intake can contribute to fluid accumulation and advised to cut back on salt intake.  09/05/2019 CorVue Direct Trend view:    He confirms taking Torsemide 20 mg daily.  Labs: 08/16/2019 Creatinine 0.81, BUN 14, Potassium 5.1, BUN 130, GFR >60 08/09/2019 Creatinine 0.80, BUN 14, Potassium 4.6, BUN 130, GFR >60   08/08/2019 Creatinine 0.91, BUN 20, Potassium 4.4, BUN 133, GFR >60 08/07/2019 Creatinine 0.89, BUN 23, Potassium 4.5, BUN 132, GFR >60 08/06/2019 Creatinine 0.95, BUN 16, Potassium 5.4, BUN 128, GFR >60 05/17/2019 Creatinine 0.75, BUN 13, Potassium 4.6, BUN 131, GFR >60   Advised to increase Torsemide to 40 mg daily x 2 days only and will recheck fluid levels on 09/09/2019.  He has a new cell phone number [phone] and will use that as primary number instead of wife's cell number.  He is unsure how to send manual transmission and will contact him 2/8 to provide assistance.    Copy sent to Dr Rayann Heman for review.

## 2019-09-09 ENCOUNTER — Ambulatory Visit (INDEPENDENT_AMBULATORY_CARE_PROVIDER_SITE_OTHER): Payer: Medicare Other

## 2019-09-09 ENCOUNTER — Encounter: Payer: Medicare Other | Admitting: Physician Assistant

## 2019-09-09 DIAGNOSIS — I5022 Chronic systolic (congestive) heart failure: Secondary | ICD-10-CM

## 2019-09-09 DIAGNOSIS — Z95810 Presence of automatic (implantable) cardiac defibrillator: Secondary | ICD-10-CM

## 2019-09-09 NOTE — Progress Notes (Signed)
EPIC Encounter for ICM Monitoring  Patient Name: Brian Bruce is a 74 y.o. male Date: 09/09/2019 Primary Care Physican: System, Pcp Not In Primary Cardiologist: Allred Electrophysiologist: Allred Bi-V Pacing: 93%  09/09/2019 Weight: 178 lbs  AT/AF Burden 66%       1st remote transmission.  Heart Failure questions reviewed.  At [facility] referral call on 2/5 patient advised patient to take Torsemide 40 mg x 2 days due to 2/4 Corvue showing possible fluid accumulation and he had gained a couple of pounds over the last few weeks.     CorVue thoracic impedance returned to normal after taking extra Torsemide tablet x 2 days.     Prescribed: Torsemide 20 mg take 1 tablet daily.  Recommendations:   Discussed effects of foods high in salt at [facility] referral call and encouraged to limit salt intake.  Follow-up plan: ICM clinic phone appointment on 10/14/2019.   91 day device clinic remote transmission 11/14/2019.      Copy of ICM check sent to Dr. Rayann Heman.   3 month ICM trend: 09/09/2019    1 Year ICM trend:       Rosalene Billings, RN 09/09/2019 3:50 PM

## 2019-09-11 DIAGNOSIS — Z7901 Long term (current) use of anticoagulants: Secondary | ICD-10-CM | POA: Diagnosis not present

## 2019-09-12 ENCOUNTER — Telehealth (HOSPITAL_COMMUNITY): Payer: Self-pay | Admitting: *Deleted

## 2019-09-12 MED ORDER — AMLODIPINE BESYLATE 2.5 MG PO TABS: 2.5000 mg | ORAL_TABLET | Freq: Every day | ORAL | 3 refills | Status: DC

## 2019-09-12 NOTE — Telephone Encounter (Signed)
Patient called in stating since stopping spironolactone due to elevated potassium his BP has been elevated. Today it was 197/78. Discussed with Adline Peals PA - will start amlodipine 2.5mg  once a day -- he will log BP and call back next week with update of readings to make sure no further adjustments needed. Pt in agreement.

## 2019-09-18 DIAGNOSIS — Z7901 Long term (current) use of anticoagulants: Secondary | ICD-10-CM | POA: Diagnosis not present

## 2019-09-25 DIAGNOSIS — Z7901 Long term (current) use of anticoagulants: Secondary | ICD-10-CM | POA: Diagnosis not present

## 2019-09-30 MED ORDER — AMLODIPINE BESYLATE 5 MG PO TABS: 5.0000 mg | ORAL_TABLET | Freq: Every day | ORAL | 3 refills | Status: DC

## 2019-09-30 NOTE — Telephone Encounter (Signed)
Pt BP continues to be elevated 170-190s/69-85. Per Adline Peals PA will increase amlodipine to 5mg  a day. Call with update in a week. Pt in agreement.

## 2019-10-02 DIAGNOSIS — Z7901 Long term (current) use of anticoagulants: Secondary | ICD-10-CM | POA: Diagnosis not present

## 2019-10-10 DIAGNOSIS — I5022 Chronic systolic (congestive) heart failure: Secondary | ICD-10-CM | POA: Diagnosis not present

## 2019-10-10 DIAGNOSIS — E119 Type 2 diabetes mellitus without complications: Secondary | ICD-10-CM | POA: Diagnosis not present

## 2019-10-10 DIAGNOSIS — I10 Essential (primary) hypertension: Secondary | ICD-10-CM | POA: Diagnosis not present

## 2019-10-14 ENCOUNTER — Ambulatory Visit (INDEPENDENT_AMBULATORY_CARE_PROVIDER_SITE_OTHER): Payer: Medicare Other

## 2019-10-14 DIAGNOSIS — Z95810 Presence of automatic (implantable) cardiac defibrillator: Secondary | ICD-10-CM | POA: Diagnosis not present

## 2019-10-14 DIAGNOSIS — I5022 Chronic systolic (congestive) heart failure: Secondary | ICD-10-CM

## 2019-10-14 NOTE — Progress Notes (Signed)
EPIC Encounter for ICM Monitoring  Patient Name: Brian Bruce is a 74 y.o. male Date: 10/14/2019 Primary Care Physican: Neale Burly, MD Primary Cardiologist: Allred Electrophysiologist: Allred Bi-V Pacing: 91%            10/14/2019 Weight: 178 lbs  AT/AF Burden 48%                                                           Heart Failure questions reviewed.  Patient reports he is feeling fine and denies any fluid symptoms.     CorVue thoracic impedance normal.     Prescribed: Torsemide 20 mg take 1 tablet daily.  Labs: 08/16/2019 Creatinine 0.81, BUN 14, Potassium 5.1, Sodium 130, GFR >60 08/09/2019 Creatinine 0.80, BUN 14, Potassium 4.6, Sodium 132, GFR >60  08/08/2019 Creatinine 0.91, BUN 20, Potassium 4.4, Sodium 133, GFR >60  08/06/2018 Creatinine 0.89, BUN 23, Potassium 4.5, Sodium 132, GFR >60  08/05/2018 Creatinine 0.95, BUN 16, Potassium 5.4, Sodium 128, GFR >60  A complete set of results can be found in Results Review.  Recommendations: No changes and encouraged to call if experiencing any fluid symptoms.  Follow-up plan: ICM clinic phone appointment on 11/15/2019.   91 day device clinic remote transmission 11/14/2019.      Copy of ICM check sent to Dr. Rayann Heman.   3 month ICM trend: 10/14/2019    1 Year ICM trend:       Rosalene Billings, RN 10/14/2019 11:43 AM

## 2019-10-21 DIAGNOSIS — I5022 Chronic systolic (congestive) heart failure: Secondary | ICD-10-CM | POA: Diagnosis not present

## 2019-10-21 DIAGNOSIS — I480 Paroxysmal atrial fibrillation: Secondary | ICD-10-CM | POA: Diagnosis not present

## 2019-10-21 DIAGNOSIS — I10 Essential (primary) hypertension: Secondary | ICD-10-CM | POA: Diagnosis not present

## 2019-10-21 DIAGNOSIS — I2510 Atherosclerotic heart disease of native coronary artery without angina pectoris: Secondary | ICD-10-CM | POA: Diagnosis not present

## 2019-10-24 ENCOUNTER — Telehealth: Payer: Self-pay | Admitting: Internal Medicine

## 2019-10-24 NOTE — Telephone Encounter (Signed)
New message:    Patient calling stating he ha a device in January 5,2021. Patient states he is having some pain in his elbows and arms. patient would like for the nurse to call him back.

## 2019-10-24 NOTE — Telephone Encounter (Signed)
Patient reports of left arm pain with movement 3 days ago that has now resolved. Now reports of right elbow pain with increased pain with movement and tender to touch. Patient does any radiation of pain, chest pain, shortness or breath of dizziness. Denies any known injury. Patient reports of taking medications with no missed doses.   Manual transmission requested by myself, patient sent. Reviewed transmission and do not note anything that could be related to pain. Transmission AP/BIV-Paced 60's. No episodes since 08/2019. Corvue WNL.   Patient advised scheduler will contact him for device clinic apt. Output voltage to be changed to chronic settings.  Patient agrees and verbalizes understanding.   Patient advised to contact PCP for further evaluation. Verbalizes understanding.

## 2019-10-25 DIAGNOSIS — X501XXA Overexertion from prolonged static or awkward postures, initial encounter: Secondary | ICD-10-CM | POA: Diagnosis not present

## 2019-10-25 DIAGNOSIS — Z7901 Long term (current) use of anticoagulants: Secondary | ICD-10-CM | POA: Diagnosis not present

## 2019-10-25 DIAGNOSIS — M25521 Pain in right elbow: Secondary | ICD-10-CM | POA: Diagnosis not present

## 2019-10-25 DIAGNOSIS — S5001XA Contusion of right elbow, initial encounter: Secondary | ICD-10-CM | POA: Diagnosis not present

## 2019-10-25 DIAGNOSIS — Z79899 Other long term (current) drug therapy: Secondary | ICD-10-CM | POA: Diagnosis not present

## 2019-10-25 DIAGNOSIS — Z950 Presence of cardiac pacemaker: Secondary | ICD-10-CM | POA: Diagnosis not present

## 2019-10-25 DIAGNOSIS — Z7982 Long term (current) use of aspirin: Secondary | ICD-10-CM | POA: Diagnosis not present

## 2019-10-25 DIAGNOSIS — Z7984 Long term (current) use of oral hypoglycemic drugs: Secondary | ICD-10-CM | POA: Diagnosis not present

## 2019-10-25 DIAGNOSIS — M25421 Effusion, right elbow: Secondary | ICD-10-CM | POA: Diagnosis not present

## 2019-10-25 DIAGNOSIS — I2510 Atherosclerotic heart disease of native coronary artery without angina pectoris: Secondary | ICD-10-CM | POA: Diagnosis not present

## 2019-10-25 DIAGNOSIS — E7800 Pure hypercholesterolemia, unspecified: Secondary | ICD-10-CM | POA: Diagnosis not present

## 2019-10-25 DIAGNOSIS — Z87891 Personal history of nicotine dependence: Secondary | ICD-10-CM | POA: Diagnosis not present

## 2019-10-25 DIAGNOSIS — M7989 Other specified soft tissue disorders: Secondary | ICD-10-CM | POA: Diagnosis not present

## 2019-10-25 DIAGNOSIS — E119 Type 2 diabetes mellitus without complications: Secondary | ICD-10-CM | POA: Diagnosis not present

## 2019-10-25 DIAGNOSIS — Z951 Presence of aortocoronary bypass graft: Secondary | ICD-10-CM | POA: Diagnosis not present

## 2019-10-30 DIAGNOSIS — Z7901 Long term (current) use of anticoagulants: Secondary | ICD-10-CM | POA: Diagnosis not present

## 2019-11-05 ENCOUNTER — Other Ambulatory Visit: Payer: Self-pay

## 2019-11-05 ENCOUNTER — Ambulatory Visit (INDEPENDENT_AMBULATORY_CARE_PROVIDER_SITE_OTHER): Payer: Medicare Other | Admitting: *Deleted

## 2019-11-05 DIAGNOSIS — I255 Ischemic cardiomyopathy: Secondary | ICD-10-CM | POA: Diagnosis not present

## 2019-11-05 DIAGNOSIS — Z95810 Presence of automatic (implantable) cardiac defibrillator: Secondary | ICD-10-CM | POA: Diagnosis not present

## 2019-11-05 DIAGNOSIS — I5022 Chronic systolic (congestive) heart failure: Secondary | ICD-10-CM

## 2019-11-06 ENCOUNTER — Telehealth: Payer: Self-pay | Admitting: *Deleted

## 2019-11-06 DIAGNOSIS — Z7901 Long term (current) use of anticoagulants: Secondary | ICD-10-CM | POA: Diagnosis not present

## 2019-11-06 DIAGNOSIS — M25521 Pain in right elbow: Secondary | ICD-10-CM | POA: Diagnosis not present

## 2019-11-06 LAB — CUP PACEART INCLINIC DEVICE CHECK
Battery Remaining Longevity: 69 mo
Brady Statistic RA Percent Paced: 45 %
Brady Statistic RV Percent Paced: 91 %
Date Time Interrogation Session: 20210406104848
HighPow Impedance: 60 Ohm
Implantable Lead Implant Date: 20201015
Implantable Lead Implant Date: 20201015
Implantable Lead Implant Date: 20201015
Implantable Lead Location: 753858
Implantable Lead Location: 753859
Implantable Lead Location: 753860
Implantable Pulse Generator Implant Date: 20201015
Lead Channel Impedance Value: 437.5 Ohm
Lead Channel Impedance Value: 462.5 Ohm
Lead Channel Impedance Value: 525 Ohm
Lead Channel Pacing Threshold Amplitude: 0.75 V
Lead Channel Pacing Threshold Amplitude: 0.75 V
Lead Channel Pacing Threshold Amplitude: 1.25 V
Lead Channel Pacing Threshold Pulse Width: 0.5 ms
Lead Channel Pacing Threshold Pulse Width: 0.5 ms
Lead Channel Pacing Threshold Pulse Width: 0.8 ms
Lead Channel Sensing Intrinsic Amplitude: 12 mV
Lead Channel Sensing Intrinsic Amplitude: 2.8 mV
Lead Channel Setting Pacing Amplitude: 2 V
Lead Channel Setting Pacing Amplitude: 2.5 V
Lead Channel Setting Pacing Amplitude: 2.5 V
Lead Channel Setting Pacing Pulse Width: 0.5 ms
Lead Channel Setting Pacing Pulse Width: 0.8 ms
Lead Channel Setting Sensing Sensitivity: 0.5 mV
Pulse Gen Serial Number: 111006847

## 2019-11-06 NOTE — Progress Notes (Signed)
CRT-D device check in office for output reprogramming. RA and RV thresholds, sensing, and impedances consistent with previous device measurements. LV threshold (D1-M2) now 2.0V @ 0.63ms, programmed at 1.5V @ 0.12ms. PNS noted with output at 2.5V @ 0.7ms. Impedance trends stable. Performed multi-vector testing, no PNS noted with M3-RVc or M4-RVc. Reprogrammed LV vector to M3-RVc as threshold 1.25V @ 0.37ms. LV output fixed at 2.5V @ 0.63ms. EKG performed, will review with Dr. Rayann Heman. 41% AT/AF burden, most recent episode from 08/06/19, on warfarin and dofetilide. 2 NSVT episodes appear AF w/RVR from 07/2019. Patient bi-ventricularly pacing 91% of the time. Device programmed with appropriate safety margins; RA/RV outputs reduced to chronic settings. Heart failure diagnostics followed by ICM clinic. Estimated longevity 5.8-6.1 years. Patient enrolled in remote follow up. Patient education completed. [place] on 11/14/19 and ROV with Dr. Rayann Heman on 01/31/20.  Discussed with Dr. Rayann Heman. Plan for CXR. May need sooner f/u depending on results. Patient aware and agreeable to plan.

## 2019-11-06 NOTE — Telephone Encounter (Signed)
Spoke with patient. He is agreeable to having a CXR at [facility]. He will be in [place] tomorrow and will plan to go then. Advised pt that depending on the CXR results, may have patient f/u sooner than appointment on 01/31/20 with Dr. Rayann Heman. Pt verbalizes understanding and agreement with plan. He denies any questions or concerns at this time.

## 2019-11-07 ENCOUNTER — Other Ambulatory Visit: Payer: Self-pay

## 2019-11-07 ENCOUNTER — Ambulatory Visit (HOSPITAL_COMMUNITY)
Admission: RE | Admit: 2019-11-07 | Discharge: 2019-11-07 | Disposition: A | Discharge status: Home / Self Care | Payer: Medicare Other | Source: Ambulatory Visit | Attending: Internal Medicine | Admitting: Internal Medicine

## 2019-11-07 DIAGNOSIS — I5022 Chronic systolic (congestive) heart failure: Secondary | ICD-10-CM | POA: Insufficient documentation

## 2019-11-07 DIAGNOSIS — Z95810 Presence of automatic (implantable) cardiac defibrillator: Secondary | ICD-10-CM | POA: Insufficient documentation

## 2019-11-11 LAB — CUP PACEART REMOTE DEVICE CHECK
Battery Remaining Longevity: 71 mo
Battery Remaining Percentage: 90 %
Battery Voltage: 2.96 V
Brady Statistic AP VP Percent: 71 %
Brady Statistic AP VS Percent: 1.1 %
Brady Statistic AS VP Percent: 22 %
Brady Statistic AS VS Percent: 5.1 %
Brady Statistic RA Percent Paced: 72 %
Date Time Interrogation Session: 20210412020157
HighPow Impedance: 63 Ohm
Implantable Lead Implant Date: 20201015
Implantable Lead Implant Date: 20201015
Implantable Lead Implant Date: 20201015
Implantable Lead Location: 753858
Implantable Lead Location: 753859
Implantable Lead Location: 753860
Implantable Pulse Generator Implant Date: 20201015
Lead Channel Impedance Value: 400 Ohm
Lead Channel Impedance Value: 440 Ohm
Lead Channel Impedance Value: 540 Ohm
Lead Channel Pacing Threshold Amplitude: 0.75 V
Lead Channel Pacing Threshold Amplitude: 0.75 V
Lead Channel Pacing Threshold Amplitude: 2 V
Lead Channel Pacing Threshold Pulse Width: 0.5 ms
Lead Channel Pacing Threshold Pulse Width: 0.5 ms
Lead Channel Pacing Threshold Pulse Width: 0.8 ms
Lead Channel Sensing Intrinsic Amplitude: 1.7 mV
Lead Channel Sensing Intrinsic Amplitude: 12 mV
Lead Channel Setting Pacing Amplitude: 2 V
Lead Channel Setting Pacing Amplitude: 2.5 V
Lead Channel Setting Pacing Amplitude: 2.5 V
Lead Channel Setting Pacing Pulse Width: 0.5 ms
Lead Channel Setting Pacing Pulse Width: 0.8 ms
Lead Channel Setting Sensing Sensitivity: 0.5 mV
Pulse Gen Serial Number: 111006847

## 2019-11-14 ENCOUNTER — Ambulatory Visit (INDEPENDENT_AMBULATORY_CARE_PROVIDER_SITE_OTHER): Payer: Medicare Other | Admitting: *Deleted

## 2019-11-14 ENCOUNTER — Telehealth: Payer: Self-pay

## 2019-11-14 DIAGNOSIS — I5022 Chronic systolic (congestive) heart failure: Secondary | ICD-10-CM | POA: Diagnosis not present

## 2019-11-14 NOTE — Progress Notes (Signed)
ICD Remote  

## 2019-11-14 NOTE — Telephone Encounter (Signed)
The pt needed help sending a transmission for his appointment today. Transmission received. The pt wanted to know about his X-ray he had on 11-07-2019. I told him that Dr. Rayann Heman had not reviewed the X-ray yet. I told him the [place], [place] looked at it and it looks good but we need to let the doctor review it. I told him I will send the doctor and his nurse a note to call him back.

## 2019-11-15 ENCOUNTER — Ambulatory Visit (INDEPENDENT_AMBULATORY_CARE_PROVIDER_SITE_OTHER): Payer: Medicare Other

## 2019-11-15 DIAGNOSIS — Z95810 Presence of automatic (implantable) cardiac defibrillator: Secondary | ICD-10-CM

## 2019-11-15 DIAGNOSIS — I5022 Chronic systolic (congestive) heart failure: Secondary | ICD-10-CM

## 2019-11-15 NOTE — Progress Notes (Signed)
EPIC Encounter for ICM Monitoring  Patient Name: KAYLER KOSKY is a 74 y.o. male Date: 11/15/2019 Primary Care Physican: Neale Burly, MD Primary Cardiologist:Allred Electrophysiologist:Allred Bi-V Pacing:96% 11/15/2019 Weight:178lbs  AT/AF Burden 0%   Heart Failure questions reviewed.Patient reports he is feeling fine and denies any fluid symptoms. Encouraged to complete DPR form at 12/06/2019 OV.  CorVue thoracic impedancenormal.  Prescribed:Torsemide 20 mg take 1 tablet daily.  Labs: 08/16/2019 Creatinine 0.81, BUN 14, Potassium 5.1, Sodium 130, GFR >60 08/09/2019 Creatinine 0.80, BUN 14, Potassium 4.6, Sodium 132, GFR >60  08/08/2019 Creatinine 0.91, BUN 20, Potassium 4.4, Sodium 133, GFR >60  08/06/2018 Creatinine 0.89, BUN 23, Potassium 4.5, Sodium 132, GFR >60  08/05/2018 Creatinine 0.95, BUN 16, Potassium 5.4, Sodium 128, GFR >60  A complete set of results can be found in Results Review.  Recommendations: No changes and encouraged to call if experiencing any fluid symptoms.  Follow-up plan: ICM clinic phone appointment on5/19/2021. 91 day device clinic remote transmission 02/13/2020.  Office visit with Dr Rayann Heman 12/06/2019.  Copy of ICM check sent to Dr.Allred.  3 month ICM trend: 11/15/2019    1 Year ICM trend:       Rosalene Billings, RN 11/15/2019 4:55 PM

## 2019-11-15 NOTE — Telephone Encounter (Signed)
Dr. Rayann Heman reviewed CXR, advised that LV lead position appears stable. May benefit from V-V optimization.  Spoke with patient. Pt reports he possibly feels a little worse since his LV lead was reprogrammed. He describes feeling more aware of his heartbeat, but he is unsure if this is because he has been thinking about it more. No other reported symptoms. Pt aware to keep AF Clinic f/u on 11/20/19. Moved appointment with Dr. Rayann Heman in [place] up to 12/06/19 at 12:30pm for potential V-V optimization. Pt agreeable to plan, will call back if any questions or concerns prior to this appointment.

## 2019-11-18 ENCOUNTER — Other Ambulatory Visit: Payer: Self-pay

## 2019-11-18 DIAGNOSIS — I5022 Chronic systolic (congestive) heart failure: Secondary | ICD-10-CM | POA: Diagnosis not present

## 2019-11-18 DIAGNOSIS — I255 Ischemic cardiomyopathy: Secondary | ICD-10-CM | POA: Diagnosis not present

## 2019-11-18 DIAGNOSIS — I4811 Longstanding persistent atrial fibrillation: Secondary | ICD-10-CM | POA: Diagnosis not present

## 2019-11-18 DIAGNOSIS — I340 Nonrheumatic mitral (valve) insufficiency: Secondary | ICD-10-CM | POA: Diagnosis not present

## 2019-11-18 DIAGNOSIS — I10 Essential (primary) hypertension: Secondary | ICD-10-CM | POA: Diagnosis not present

## 2019-11-18 DIAGNOSIS — I2510 Atherosclerotic heart disease of native coronary artery without angina pectoris: Secondary | ICD-10-CM | POA: Diagnosis not present

## 2019-11-18 MED ORDER — DIGOXIN 125 MCG PO TABS: 0.1250 mg | ORAL_TABLET | Freq: Every day | ORAL | 2 refills | Status: DC

## 2019-11-18 MED ORDER — TORSEMIDE 20 MG PO TABS: 20.0000 mg | ORAL_TABLET | Freq: Every day | ORAL | 2 refills | Status: DC

## 2019-11-18 NOTE — Telephone Encounter (Signed)
Pt's medications were sent to pt's pharmacy as requested. Confirmation received.  

## 2019-11-20 ENCOUNTER — Encounter (HOSPITAL_COMMUNITY): Payer: Self-pay | Admitting: Nurse Practitioner

## 2019-11-20 ENCOUNTER — Ambulatory Visit (HOSPITAL_COMMUNITY)
Admission: RE | Admit: 2019-11-20 | Discharge: 2019-11-20 | Disposition: A | Discharge status: Home / Self Care | Payer: Medicare Other | Source: Ambulatory Visit | Attending: Physician Assistant | Admitting: Physician Assistant

## 2019-11-20 ENCOUNTER — Other Ambulatory Visit: Payer: Self-pay

## 2019-11-20 VITALS — BP 150/60 | HR 60 | Ht 69.0 in | Wt 185.6 lb

## 2019-11-20 DIAGNOSIS — I447 Left bundle-branch block, unspecified: Secondary | ICD-10-CM | POA: Insufficient documentation

## 2019-11-20 DIAGNOSIS — I4819 Other persistent atrial fibrillation: Secondary | ICD-10-CM | POA: Diagnosis not present

## 2019-11-20 DIAGNOSIS — I5022 Chronic systolic (congestive) heart failure: Secondary | ICD-10-CM | POA: Insufficient documentation

## 2019-11-20 DIAGNOSIS — Z87891 Personal history of nicotine dependence: Secondary | ICD-10-CM | POA: Insufficient documentation

## 2019-11-20 DIAGNOSIS — E119 Type 2 diabetes mellitus without complications: Secondary | ICD-10-CM | POA: Insufficient documentation

## 2019-11-20 DIAGNOSIS — D6869 Other thrombophilia: Secondary | ICD-10-CM | POA: Diagnosis not present

## 2019-11-20 DIAGNOSIS — I2510 Atherosclerotic heart disease of native coronary artery without angina pectoris: Secondary | ICD-10-CM | POA: Diagnosis not present

## 2019-11-20 DIAGNOSIS — Z7901 Long term (current) use of anticoagulants: Secondary | ICD-10-CM | POA: Diagnosis not present

## 2019-11-20 DIAGNOSIS — Z79899 Other long term (current) drug therapy: Secondary | ICD-10-CM | POA: Diagnosis not present

## 2019-11-20 DIAGNOSIS — Z7984 Long term (current) use of oral hypoglycemic drugs: Secondary | ICD-10-CM | POA: Insufficient documentation

## 2019-11-20 LAB — MAGNESIUM: Magnesium: 2.2 mg/dL (ref 1.7–2.4)

## 2019-11-20 LAB — BASIC METABOLIC PANEL
Anion gap: 10 (ref 5–15)
BUN: 10 mg/dL (ref 8–23)
CO2: 24 mmol/L (ref 22–32)
Calcium: 9.2 mg/dL (ref 8.9–10.3)
Chloride: 97 mmol/L — ABNORMAL LOW (ref 98–111)
Creatinine, Ser: 0.77 mg/dL (ref 0.61–1.24)
GFR calc Af Amer: >60 mL/min (ref >60)
GFR calc non Af Amer: >60 mL/min (ref >60)
Glucose, Bld: 124 mg/dL — ABNORMAL HIGH (ref 70–99)
Potassium: 4.4 mmol/L (ref 3.5–5.1)
Sodium: 131 mmol/L — ABNORMAL LOW (ref 135–145)

## 2019-11-20 NOTE — Progress Notes (Signed)
Primary Care Physician: Dr Stoney Bang Primary Cardiologist: Dr Dot Lanes Primary Electrophysiologist: Dr Rayann Heman Referring Physician: Tommye Standard PA   Brian Bruce is a 74 y.o. male with a history of CAD (CABG 1998), DM, persistent Afib, ICM s/p ICD, chronic CHF (systolic), LBBB who presents for follow up in the [facility]. Patient seen in EP clinic post ICD implant with persistent afib. He is on warfarin for a CHADS2VASC score of 4. Patient would like to pursue Tikosyn. He is fairly unaware of his arrhythmia with no increased SOB, fatigue, palpitations, or heart racing. He denies significant snoring or alcohol use. Patient is s/p dofetilide loading 1/5-08/09/19. He converted to SR on the medication and did not require DCCV.   On follow up today, patient reports he has done well since his last visit. He has had some vague flutter sensation close to his PPM and he has close follow up with Dr Rayann Heman. He denies any bleeding issues on anticoagulation. He remains very active.   Today, he denies symptoms of palpitations, chest pain, shortness of breath, orthopnea, PND, lower extremity edema, dizziness, presyncope, syncope, snoring, daytime somnolence, bleeding, or neurologic sequela. The patient is tolerating medications without difficulties and is otherwise without complaint today.    Atrial Fibrillation Risk Factors:  he does not have symptoms or diagnosis of sleep apnea. he does not have a history of rheumatic fever.   he has a BMI of Body mass index is 27.41 kg/m.Marland Kitchen Filed Weights   11/20/19 0853  Weight: 84.2 kg    Family History  Problem Relation Age of Onset  . Cancer Mother   . Heart failure Father      Atrial Fibrillation Management history:  Previous antiarrhythmic drugs: dofetilide Previous cardioversions: none Previous ablations: none CHADS2VASC score: 4 Anticoagulation history: warfarin    Past Medical History:  Diagnosis Date  .  Asthma   . CHF (congestive heart failure) ([place])   . Coronary artery disease   . Diabetes mellitus without complication ([place])   . Ischemic cardiomyopathy   . LBBB (left bundle branch block)   . Persistent atrial fibrillation [facility])    Past Surgical History:  Procedure Laterality Date  . BIV ICD INSERTION CRT-D N/A 05/16/2019   Procedure: BIV ICD INSERTION CRT-D;  Surgeon: Thompson Grayer, MD;  Location: [place] [facility];  Service: Cardiovascular;  Laterality: N/A;  . CORONARY ARTERY BYPASS GRAFT  1998   4-vessel  . RIGHT/LEFT HEART CATH AND CORONARY/GRAFT ANGIOGRAPHY N/A 03/07/2019   Procedure: RIGHT/LEFT HEART CATH AND CORONARY/GRAFT ANGIOGRAPHY;  Surgeon: Nelva Bush, MD;  Location: [place] [facility];  Service: Cardiovascular;  Laterality: N/A;    Current Outpatient Medications  Medication Sig Dispense Refill  . acetaminophen (TYLENOL) 500 MG tablet Take 500 mg by mouth every 6 (six) hours as needed for moderate pain.    Marland Kitchen amLODipine (NORVASC) 10 MG tablet Take 1 tablet by mouth daily.    Marland Kitchen aspirin EC 81 MG tablet Take 81 mg by mouth daily.    Marland Kitchen atorvastatin (LIPITOR) 80 MG tablet Take 80 mg by mouth every evening.    Marland Kitchen augmented betamethasone dipropionate (DIPROLENE-AF) 0.05 % cream Apply 1 application topically 2 (two) times daily as needed (rash).    . candesartan (ATACAND) 32 MG tablet Take 1 tablet by mouth daily.    . carvedilol (COREG) 25 MG tablet Take 25 mg by mouth 2 (two) times daily.     . Cholecalciferol (VITAMIN D) 50 MCG (  2000 UT) tablet Take 2,000 Units by mouth daily.    . Coenzyme Q10 (CO Q-10) 100 MG CAPS Take 100 mg by mouth daily.    . diclofenac Sodium (VOLTAREN) 1 % GEL     . digoxin (LANOXIN) 0.125 MG tablet Take 1 tablet (0.125 mg total) by mouth daily. 90 tablet 2  . dofetilide (TIKOSYN) 500 MCG capsule Take 1 capsule (500 mcg total) by mouth 2 (two) times daily. 60 capsule 6  . glimepiride (AMARYL) 2 MG tablet Take 1 tablet by mouth daily.    Marland Kitchen  GLUCOSAMINE-CHONDROITIN PO Take 1 tablet by mouth 2 (two) times daily.    . hydrALAZINE (APRESOLINE) 25 MG tablet Take by mouth.    Marland Kitchen HYDROcodone-acetaminophen (NORCO/VICODIN) 5-325 MG tablet     . ketotifen (ALAWAY) 0.025 % ophthalmic solution Place 1 drop into both eyes daily.    . magnesium oxide (MAG-OX) 400 (241.3 Mg) MG tablet Take 400 mg by mouth 2 (two) times daily.     . metFORMIN (GLUCOPHAGE) 1000 MG tablet Take 1 tablet (1,000 mg total) by mouth 2 (two) times daily.    . Omega-3 Fatty Acids (FISH OIL) 1000 MG CAPS Take 1,000 mg by mouth daily.    Marland Kitchen torsemide (DEMADEX) 20 MG tablet Take 1 tablet (20 mg total) by mouth daily. 90 tablet 2  . warfarin (COUMADIN) 5 MG tablet Take 7.5 mg by mouth daily.      No current facility-administered medications for this encounter.    No Known Allergies  Social History   Socioeconomic History  . Marital status: Married    Spouse name: Not on file  . Number of children: Not on file  . Years of education: Not on file  . Highest education level: Not on file  Occupational History  . Not on file  Tobacco Use  . Smoking status: Former Smoker    Types: Cigarettes    Quit date: 1980    Years since quitting: 41.3  . Smokeless tobacco: Never Used  Substance and Sexual Activity  . Alcohol use: Yes    Alcohol/week: 2.0 - 3.0 standard drinks    Types: 2 - 3 Cans of beer per week    Comment: every evening  . Drug use: Never  . Sexual activity: Not on file  Other Topics Concern  . Not on file  Social History Narrative   Lives in [place] with spouse.  Retired.   Previously worked as a Designer, television/film set:   . Difficulty of Paying Living Expenses:   Food Insecurity:   . Worried About Charity fundraiser in the Last Year:   . Arboriculturist in the Last Year:   Transportation Needs:   . Film/video editor (Medical):   Marland Kitchen Lack of Transportation (Non-Medical):   Physical  Activity:   . Days of Exercise per Week:   . Minutes of Exercise per Session:   Stress:   . Feeling of Stress :   Social Connections:   . Frequency of Communication with Friends and Family:   . Frequency of Social Gatherings with Friends and Family:   . Attends Religious Services:   . Active Member of Clubs or Organizations:   . Attends Archivist Meetings:   Marland Kitchen Marital Status:   Intimate Partner Violence:   . Fear of Current or Ex-Partner:   . Emotionally Abused:   Marland Kitchen Physically Abused:   .  Sexually Abused:      ROS- All systems are reviewed and negative except as per the HPI above.  Physical Exam: Vitals:   11/20/19 0853  BP: (!) 150/60  Pulse: 60  Weight: 84.2 kg  Height: 5\' 9"  (1.753 m)    GEN- The patient is well appearing, alert and oriented x 3 today.   HEENT-head normocephalic, atraumatic, sclera clear, conjunctiva pink, hearing intact, trachea midline. Lungs- Clear to ausculation bilaterally, normal work of breathing Heart- Regular rate and rhythm, no murmurs, rubs or gallops  GI- soft, NT, ND, + BS Extremities- no clubbing, cyanosis, or edema MS- no significant deformity or atrophy Skin- no rash or lesion Psych- euthymic mood, full affect Neuro- strength and sensation are intact   Wt Readings from Last 3 Encounters:  11/20/19 84.2 kg  08/16/19 82.6 kg  08/09/19 80.9 kg    EKG today demonstrates AV dual paced HR 60, PR 186, QRS 158, QTc 510  Echo 11/23/18 demonstrated  Moderately dilated left ventricle with severely reduced LVEF. Moderately reduced RV with moderately reduced contraction. No significant pericardial effusion. Moderate to severe mitral regurgitation  Epic records are reviewed at length today  Assessment and Plan:  1. Persistent atrial fibrillation Patient is s/p dofetilide loading 1/5-08/09/19. Patient appears to be maintaining SR.  Continue dofetilide 500 mcg BID. QT stable. Check bmet/mag today. Continue warfarin  This  patients CHA2DS2-VASc Score and unadjusted Ischemic Stroke Rate (% per year) is equal to 4.8 % stroke rate/year from a score of 4  Above score calculated as 1 point each if present [CHF, HTN, DM, Vascular=MI/PAD/Aortic Plaque, Age if 65-74, or Male] Above score calculated as 2 points each if present [Age > 75, or Stroke/TIA/TE]  2. CAD No anginal symptoms.  3. ICM/Chronic systolic CHF S/p ICD, followed by Dr Rayann Heman and the device clinic. No signs or symptoms of fluid overload.  4. HTN Patient brings in BP log with elevated readings. Improved but mildly elevated in office today. His primary cardiologist started him on hydralazine yesterday. No change today.   Follow up with Dr Rayann Heman as scheduled. AF clinic in 4 months.    [facility] [address] [place], [place] [address] [phone] 11/20/2019 9:39 AM

## 2019-12-02 DIAGNOSIS — Z23 Encounter for immunization: Secondary | ICD-10-CM | POA: Diagnosis not present

## 2019-12-04 DIAGNOSIS — Z7901 Long term (current) use of anticoagulants: Secondary | ICD-10-CM | POA: Diagnosis not present

## 2019-12-06 ENCOUNTER — Encounter: Payer: Self-pay | Admitting: Internal Medicine

## 2019-12-06 ENCOUNTER — Other Ambulatory Visit: Payer: Self-pay

## 2019-12-06 ENCOUNTER — Ambulatory Visit (INDEPENDENT_AMBULATORY_CARE_PROVIDER_SITE_OTHER): Payer: Medicare Other | Admitting: Internal Medicine

## 2019-12-06 VITALS — BP 146/64 | HR 60 | Ht 70.0 in | Wt 186.0 lb

## 2019-12-06 DIAGNOSIS — I255 Ischemic cardiomyopathy: Secondary | ICD-10-CM

## 2019-12-06 DIAGNOSIS — I4819 Other persistent atrial fibrillation: Secondary | ICD-10-CM

## 2019-12-06 DIAGNOSIS — I519 Heart disease, unspecified: Secondary | ICD-10-CM

## 2019-12-06 LAB — CUP PACEART INCLINIC DEVICE CHECK
Battery Remaining Longevity: 69 mo
Brady Statistic RA Percent Paced: 77 %
Brady Statistic RV Percent Paced: 93 %
Date Time Interrogation Session: 20210507084524
HighPow Impedance: 60.75 Ohm
Implantable Lead Implant Date: 20201015
Implantable Lead Implant Date: 20201015
Implantable Lead Implant Date: 20201015
Implantable Lead Location: 753858
Implantable Lead Location: 753859
Implantable Lead Location: 753860
Implantable Pulse Generator Implant Date: 20201015
Lead Channel Impedance Value: 425 Ohm
Lead Channel Impedance Value: 437.5 Ohm
Lead Channel Impedance Value: 512.5 Ohm
Lead Channel Pacing Threshold Amplitude: 0.75 V
Lead Channel Pacing Threshold Amplitude: 0.75 V
Lead Channel Pacing Threshold Amplitude: 1 V
Lead Channel Pacing Threshold Amplitude: 1 V
Lead Channel Pacing Threshold Amplitude: 1.25 V
Lead Channel Pacing Threshold Amplitude: 1.25 V
Lead Channel Pacing Threshold Pulse Width: 0.5 ms
Lead Channel Pacing Threshold Pulse Width: 0.5 ms
Lead Channel Pacing Threshold Pulse Width: 0.5 ms
Lead Channel Pacing Threshold Pulse Width: 0.5 ms
Lead Channel Pacing Threshold Pulse Width: 0.8 ms
Lead Channel Pacing Threshold Pulse Width: 0.8 ms
Lead Channel Sensing Intrinsic Amplitude: 12 mV
Lead Channel Sensing Intrinsic Amplitude: 3 mV
Lead Channel Setting Pacing Amplitude: 2 V
Lead Channel Setting Pacing Amplitude: 2.5 V
Lead Channel Setting Pacing Amplitude: 2.5 V
Lead Channel Setting Pacing Pulse Width: 0.5 ms
Lead Channel Setting Pacing Pulse Width: 0.8 ms
Lead Channel Setting Sensing Sensitivity: 0.5 mV
Pulse Gen Serial Number: 111006847

## 2019-12-06 NOTE — Progress Notes (Signed)
PCP: Neale Burly, MD Primary Cardiologist: Dr Candis Musa Primary EP: Dr Lucious Groves is a 74 y.o. male who presents today for routine electrophysiology followup.  Since last being seen in our clinic, the patient reports doing very well. Unfortunately his wife died this past week of COPD.    Today, he denies symptoms of palpitations, chest pain, shortness of breath,  lower extremity edema, dizziness, presyncope, syncope, or ICD shocks.  The patient is otherwise without complaint today.   Past Medical History:  Diagnosis Date  . Asthma   . CHF (congestive heart failure) ([place])   . Coronary artery disease   . Diabetes mellitus without complication ([place])   . Ischemic cardiomyopathy   . LBBB (left bundle branch block)   . Persistent atrial fibrillation [facility])    Past Surgical History:  Procedure Laterality Date  . BIV ICD INSERTION CRT-D N/A 05/16/2019   Procedure: BIV ICD INSERTION CRT-D;  Surgeon: Thompson Grayer, MD;  Location: [place] [facility];  Service: Cardiovascular;  Laterality: N/A;  . CORONARY ARTERY BYPASS GRAFT  1998   4-vessel  . RIGHT/LEFT HEART CATH AND CORONARY/GRAFT ANGIOGRAPHY N/A 03/07/2019   Procedure: RIGHT/LEFT HEART CATH AND CORONARY/GRAFT ANGIOGRAPHY;  Surgeon: Nelva Bush, MD;  Location: [place] [facility];  Service: Cardiovascular;  Laterality: N/A;    ROS- all systems are reviewed and negative except as per HPI above  Current Outpatient Medications  Medication Sig Dispense Refill  . acetaminophen (TYLENOL) 500 MG tablet Take 500 mg by mouth every 6 (six) hours as needed for moderate pain.    Marland Kitchen amLODipine (NORVASC) 10 MG tablet Take 1 tablet by mouth daily.    Marland Kitchen aspirin EC 81 MG tablet Take 81 mg by mouth daily.    Marland Kitchen atorvastatin (LIPITOR) 80 MG tablet Take 80 mg by mouth every evening.    Marland Kitchen augmented betamethasone dipropionate (DIPROLENE-AF) 0.05 % cream Apply 1 application topically 2 (two) times daily as needed (rash).    . candesartan  (ATACAND) 32 MG tablet Take 1 tablet by mouth daily.    . carvedilol (COREG) 25 MG tablet Take 25 mg by mouth 2 (two) times daily.     . Cholecalciferol (VITAMIN D) 50 MCG (2000 UT) tablet Take 2,000 Units by mouth daily.    . Coenzyme Q10 (CO Q-10) 100 MG CAPS Take 100 mg by mouth daily.    . diclofenac Sodium (VOLTAREN) 1 % GEL     . digoxin (LANOXIN) 0.125 MG tablet Take 1 tablet (0.125 mg total) by mouth daily. 90 tablet 2  . dofetilide (TIKOSYN) 500 MCG capsule Take 1 capsule (500 mcg total) by mouth 2 (two) times daily. 60 capsule 6  . glimepiride (AMARYL) 2 MG tablet Take 1 tablet by mouth daily.    Marland Kitchen GLUCOSAMINE-CHONDROITIN PO Take 1 tablet by mouth 2 (two) times daily.    . hydrALAZINE (APRESOLINE) 25 MG tablet Take by mouth 3 (three) times daily.     Marland Kitchen HYDROcodone-acetaminophen (NORCO/VICODIN) 5-325 MG tablet     . ketotifen (ALAWAY) 0.025 % ophthalmic solution Place 1 drop into both eyes daily.    . magnesium oxide (MAG-OX) 400 (241.3 Mg) MG tablet Take 400 mg by mouth 2 (two) times daily.     . metFORMIN (GLUCOPHAGE) 1000 MG tablet Take 1 tablet (1,000 mg total) by mouth 2 (two) times daily.    . Omega-3 Fatty Acids (FISH OIL) 1000 MG CAPS Take 1,000 mg by mouth daily.    Marland Kitchen  torsemide (DEMADEX) 20 MG tablet Take 1 tablet (20 mg total) by mouth daily. 90 tablet 2  . warfarin (COUMADIN) 5 MG tablet Take 7.5 mg by mouth daily.      No current facility-administered medications for this visit.    Physical Exam: Vitals:   12/06/19 0817  BP: (!) 146/64  Pulse: 60  SpO2: 98%  Weight: 186 lb (84.4 kg)  Height: 5\' 10"  (1.778 m)    GEN- The patient is well appearing, alert and oriented x 3 today.   Head- normocephalic, atraumatic Eyes-  Sclera clear, conjunctiva pink Ears- hearing intact Oropharynx- clear Lungs-   normal work of breathing Chest- ICD pocket is well healed Heart- Regular rate and rhythm  GI- soft, NT, ND, + BS Extremities- no clubbing, cyanosis, or edema  ICD  interrogation- reviewed in detail today,  See PACEART report  ekg tracing ordered today is personally reviewed and shows sinus with BiV pacing  Wt Readings from Last 3 Encounters:  12/06/19 186 lb (84.4 kg)  11/20/19 185 lb 9.6 oz (84.2 kg)  08/16/19 182 lb 3.2 oz (82.6 kg)    Assessment and Plan:  1.  Chronic systolic dysfunction/ ischemic CM/ CAD euvolemic today No CHF symptoms No ischemic symptoms Stable on an appropriate medical regimen Normal ICD function See Pace Art report No changes today he is not device dependant today followed in ICM device clinic  2. Persistent afib Maintaining sinus rhythm with tikosyn The importance of close follow-up on tikosyn to avoid toxicity Qt is stable  Return to AF clinic in 3 months I will see in 6 months Follow-up with Dr Candis Musa as scheduled  Thompson Grayer MD, [facility] 12/06/2019 9:03 AM

## 2019-12-06 NOTE — Patient Instructions (Signed)
Medication Instructions:  Continue all current medications.   Labwork: none  Testing/Procedures: none  Follow-Up: 6 months   Any Other Special Instructions Will Be Listed Below (If Applicable).   If you need a refill on your cardiac medications before your next appointment, please call your pharmacy.  

## 2019-12-09 ENCOUNTER — Telehealth: Payer: Self-pay | Admitting: Internal Medicine

## 2019-12-09 NOTE — Telephone Encounter (Signed)
Patient states he is returning a call from 12/06/2019. I did not see a note.

## 2019-12-10 ENCOUNTER — Telehealth: Payer: Self-pay | Admitting: Internal Medicine

## 2019-12-10 NOTE — Telephone Encounter (Signed)
Patient walked into office stating that someone called him of Friday.

## 2019-12-16 DIAGNOSIS — I420 Dilated cardiomyopathy: Secondary | ICD-10-CM | POA: Diagnosis not present

## 2019-12-16 DIAGNOSIS — I255 Ischemic cardiomyopathy: Secondary | ICD-10-CM | POA: Diagnosis not present

## 2019-12-16 DIAGNOSIS — I4811 Longstanding persistent atrial fibrillation: Secondary | ICD-10-CM | POA: Diagnosis not present

## 2019-12-16 DIAGNOSIS — I5022 Chronic systolic (congestive) heart failure: Secondary | ICD-10-CM | POA: Diagnosis not present

## 2019-12-16 DIAGNOSIS — I10 Essential (primary) hypertension: Secondary | ICD-10-CM | POA: Diagnosis not present

## 2019-12-17 DIAGNOSIS — Z23 Encounter for immunization: Secondary | ICD-10-CM | POA: Diagnosis not present

## 2019-12-18 ENCOUNTER — Ambulatory Visit (INDEPENDENT_AMBULATORY_CARE_PROVIDER_SITE_OTHER): Payer: Medicare Other

## 2019-12-18 DIAGNOSIS — Z95810 Presence of automatic (implantable) cardiac defibrillator: Secondary | ICD-10-CM

## 2019-12-18 DIAGNOSIS — Z7901 Long term (current) use of anticoagulants: Secondary | ICD-10-CM | POA: Diagnosis not present

## 2019-12-18 DIAGNOSIS — I5022 Chronic systolic (congestive) heart failure: Secondary | ICD-10-CM

## 2019-12-20 NOTE — Progress Notes (Signed)
EPIC Encounter for ICM Monitoring  Patient Name: Brian Bruce is a 74 y.o. male Date: 12/20/2019 Primary Care Physican: Neale Burly, MD Primary [facility] Electrophysiologist:Allred Bi-V Pacing:92% 11/15/2019 Weight:178lbs  AT/AF Burden 0%   Spoke with patient.  He has swollen ankles and weight increased to 180 lbs.  His wife passed away on 30-Dec-2019 from COPD.  He had a lot of food brought to the house and eating restaurant foods.    CorVue thoracic impedancesuggesting possible fluid accumulation starting 12/17/2019.Impedance has been suggestive of possible fluid accumulation numerous days in April/May.  Prescribed:Torsemide 20 mg take 1 tablet daily.  Labs: 11/20/2019 Creatinine 0.77, BUN 10, Potassium 4.4, Sodium 131, GFR >60 08/16/2019 Creatinine0.81, BUN14, Potassium5.1, Sodium130, GFR>60 08/09/2019 Creatinine0.80, BUN14, Potassium4.6, Sodium132, GFR>60  08/08/2019 Creatinine0.91, BUN20, Potassium4.4, Sodium133, GFR>60  A complete set of results can be found in Results Review.  Recommendations:Patient has been taking extra Torsemide as needed due to symptoms.  He will continue until symptoms improve.   Follow-up plan: ICM clinic phone appointment on5/25/2021 (manual send) to recheck fluid levels. 91 day device clinic remote transmission 02/13/2020.     Copy of ICM check sent to Dr.Allred.  3 month ICM trend: 12/18/2019    1 Year ICM trend:       Rosalene Billings, RN 12/20/2019 8:11 AM

## 2019-12-24 ENCOUNTER — Ambulatory Visit (INDEPENDENT_AMBULATORY_CARE_PROVIDER_SITE_OTHER): Payer: Medicare Other

## 2019-12-24 ENCOUNTER — Telehealth: Payer: Self-pay

## 2019-12-24 DIAGNOSIS — Z95810 Presence of automatic (implantable) cardiac defibrillator: Secondary | ICD-10-CM

## 2019-12-24 DIAGNOSIS — I5022 Chronic systolic (congestive) heart failure: Secondary | ICD-10-CM

## 2019-12-24 NOTE — Telephone Encounter (Signed)
Attempted ICM call to patient to assist with sending remote transmission to recheck fluid levels.  Voice mail box not set up and unable to leave a message.

## 2019-12-25 NOTE — Progress Notes (Signed)
EPIC Encounter for ICM Monitoring  Patient Name: Brian Bruce is a 74 y.o. male Date: 12/25/2019 Primary Care Physican: Neale Burly, MD Primary [facility] Electrophysiologist:Allred Bi-V Pacing:95% 12/25/2019 Weight:180lbs  AT/AF Burden0%   Spoke with patient and he reports ankle swelling has resolved.  He is feeling fine at this time.  CorVue thoracic impedancereturned to normal after taking extraTorsemide.  Prescribed:Torsemide 20 mg take 1 tablet daily.  Labs: 11/20/2019 Creatinine 0.77, BUN 10, Potassium 4.4, Sodium 131, GFR >60 08/16/2019 Creatinine0.81, BUN14, Potassium5.1, Sodium130, GFR>60 08/09/2019 Creatinine0.80, BUN14, Potassium4.6, Sodium132, GFR>60  08/08/2019 Creatinine0.91, BUN20, Potassium4.4, Sodium133, GFR>60  A complete set of results can be found in Results Review.  Recommendations:No changes and encouraged to call if experiencing any fluid symptoms.   Follow-up plan: ICM clinic phone appointment on6/21/2021. 91 day device clinic remote transmission7/15/2021.   Copy of ICM check sent to Dr.Allred.  3 month ICM trend: 12/25/2019    1 Year ICM trend:       Rosalene Billings, RN 12/25/2019 10:26 AM

## 2019-12-25 NOTE — Telephone Encounter (Signed)
Assisted patient with sending remote transmission.  Transmission received and reviewed.  See ICM note.

## 2019-12-27 DIAGNOSIS — I110 Hypertensive heart disease with heart failure: Secondary | ICD-10-CM | POA: Diagnosis not present

## 2019-12-27 DIAGNOSIS — I5022 Chronic systolic (congestive) heart failure: Secondary | ICD-10-CM | POA: Diagnosis not present

## 2019-12-27 DIAGNOSIS — I350 Nonrheumatic aortic (valve) stenosis: Secondary | ICD-10-CM | POA: Diagnosis not present

## 2020-01-01 DIAGNOSIS — Z7901 Long term (current) use of anticoagulants: Secondary | ICD-10-CM | POA: Diagnosis not present

## 2020-01-20 ENCOUNTER — Ambulatory Visit (INDEPENDENT_AMBULATORY_CARE_PROVIDER_SITE_OTHER): Payer: Medicare Other

## 2020-01-20 ENCOUNTER — Telehealth: Payer: Self-pay

## 2020-01-20 DIAGNOSIS — Z95810 Presence of automatic (implantable) cardiac defibrillator: Secondary | ICD-10-CM | POA: Diagnosis not present

## 2020-01-20 DIAGNOSIS — I5022 Chronic systolic (congestive) heart failure: Secondary | ICD-10-CM | POA: Diagnosis not present

## 2020-01-20 NOTE — Telephone Encounter (Signed)
Spoke with patient advised to send ICM remote transmission since it was not received today.  Assisted with transmission.

## 2020-01-21 NOTE — Progress Notes (Signed)
EPIC Encounter for ICM Monitoring  Patient Name: Brian Bruce is a 74 y.o. male Date: 01/21/2020 Primary Care Physican: Neale Burly, MD Primary [facility] Electrophysiologist:Allred Bi-V Pacing:97% 01/21/2020 Weight:180- 182 lbs  AT/AF Burden0%  Spoke with patient and reports feeling well at this time.  Denies fluid symptoms.    CorVue thoracic impedancenormal.  Prescribed:Torsemide 20 mg take 1 tablet daily.  Labs: 11/20/2019 Creatinine 0.77, BUN 10, Potassium 4.4, Sodium 131, GFR >60 08/16/2019 Creatinine0.81, BUN14, Potassium5.1, Sodium130, GFR>60 08/09/2019 Creatinine0.80, BUN14, Potassium4.6, Sodium132, GFR>60  08/08/2019 Creatinine0.91, BUN20, Potassium4.4, Sodium133, GFR>60  A complete set of results can be found in Results Review.  Recommendations:No changes and encouraged to call if experiencing any fluid symptoms.  Follow-up plan: ICM clinic phone appointment on 02/24/2020. 91 day device clinic remote transmission7/15/2021.  Copy of ICM check sent to Dr.Allred.   3 month ICM trend: 01/20/2020    1 Year ICM trend:       Rosalene Billings, RN 01/21/2020 9:02 AM

## 2020-01-21 NOTE — Patient Instructions (Signed)
t

## 2020-01-31 ENCOUNTER — Encounter: Payer: Medicare Other | Admitting: Internal Medicine

## 2020-02-11 ENCOUNTER — Other Ambulatory Visit (HOSPITAL_COMMUNITY): Payer: Self-pay | Admitting: *Deleted

## 2020-02-11 MED ORDER — MAGNESIUM OXIDE 400 (241.3 MG) MG PO TABS: 400.0000 mg | ORAL_TABLET | Freq: Two times a day (BID) | ORAL | 6 refills | Status: DC

## 2020-02-13 ENCOUNTER — Ambulatory Visit (INDEPENDENT_AMBULATORY_CARE_PROVIDER_SITE_OTHER): Payer: Medicare Other | Admitting: *Deleted

## 2020-02-13 DIAGNOSIS — I4819 Other persistent atrial fibrillation: Secondary | ICD-10-CM

## 2020-02-13 LAB — CUP PACEART REMOTE DEVICE CHECK
Battery Remaining Longevity: 68 mo
Battery Remaining Percentage: 86 %
Battery Voltage: 2.96 V
Brady Statistic AP VP Percent: 91 %
Brady Statistic AP VS Percent: 1 %
Brady Statistic AS VP Percent: 6.5 %
Brady Statistic AS VS Percent: 1.8 %
Brady Statistic RA Percent Paced: 91 %
Date Time Interrogation Session: 20210715021513
HighPow Impedance: 61 Ohm
Implantable Lead Implant Date: 20201015
Implantable Lead Implant Date: 20201015
Implantable Lead Implant Date: 20201015
Implantable Lead Location: 753858
Implantable Lead Location: 753859
Implantable Lead Location: 753860
Implantable Pulse Generator Implant Date: 20201015
Lead Channel Impedance Value: 430 Ohm
Lead Channel Impedance Value: 440 Ohm
Lead Channel Impedance Value: 530 Ohm
Lead Channel Pacing Threshold Amplitude: 0.75 V
Lead Channel Pacing Threshold Amplitude: 1 V
Lead Channel Pacing Threshold Amplitude: 1.25 V
Lead Channel Pacing Threshold Pulse Width: 0.5 ms
Lead Channel Pacing Threshold Pulse Width: 0.5 ms
Lead Channel Pacing Threshold Pulse Width: 0.8 ms
Lead Channel Sensing Intrinsic Amplitude: 12 mV
Lead Channel Sensing Intrinsic Amplitude: 2 mV
Lead Channel Setting Pacing Amplitude: 2 V
Lead Channel Setting Pacing Amplitude: 2.5 V
Lead Channel Setting Pacing Amplitude: 2.5 V
Lead Channel Setting Pacing Pulse Width: 0.5 ms
Lead Channel Setting Pacing Pulse Width: 0.8 ms
Lead Channel Setting Sensing Sensitivity: 0.5 mV
Pulse Gen Serial Number: 111006847

## 2020-02-14 NOTE — Progress Notes (Signed)
Remote ICD transmission.   

## 2020-02-24 ENCOUNTER — Ambulatory Visit: Payer: Medicare Other | Admitting: Internal Medicine

## 2020-02-24 ENCOUNTER — Ambulatory Visit (INDEPENDENT_AMBULATORY_CARE_PROVIDER_SITE_OTHER): Payer: Medicare Other

## 2020-02-24 DIAGNOSIS — Z95810 Presence of automatic (implantable) cardiac defibrillator: Secondary | ICD-10-CM | POA: Diagnosis not present

## 2020-02-24 DIAGNOSIS — I5022 Chronic systolic (congestive) heart failure: Secondary | ICD-10-CM

## 2020-02-25 NOTE — Progress Notes (Signed)
EPIC Encounter for ICM Monitoring  Patient Name: Brian Bruce is a 74 y.o. male Date: 02/25/2020 Primary Care Physican: Neale Burly, MD Primary [facility] Electrophysiologist:Allred Bi-V Pacing:98% 02/25/2020 Weight:180- 182 lbs  AT/AF Burden0%  Spoke with patient and reports feeling well at this time.  Denies fluid symptoms.    CorVue thoracic impedancenormal.  Prescribed:Torsemide 20 mg take 1 tablet daily.  Labs: 11/20/2019 Creatinine 0.77, BUN 10, Potassium 4.4, Sodium 131, GFR >60 08/16/2019 Creatinine0.81, BUN14, Potassium5.1, Sodium130, GFR>60 08/09/2019 Creatinine0.80, BUN14, Potassium4.6, Sodium132, GFR>60  08/08/2019 Creatinine0.91, BUN20, Potassium4.4, Sodium133, GFR>60  A complete set of results can be found in Results Review.  Recommendations: No changes and encouraged to call if experiencing any fluid symptoms.  Follow-up plan: ICM clinic phone appointment on 03/30/2020.   91 day device clinic remote transmission 05/14/2020.    EP/Cardiology Office Visits: 06/05/2020 with Dr. Rayann Heman.    Copy of ICM check sent to Dr. Rayann Heman.   3 month ICM trend: 02/24/2020    1 Year ICM trend:       Rosalene Billings, RN 02/25/2020 3:12 PM

## 2020-02-26 ENCOUNTER — Telehealth: Payer: Self-pay | Admitting: Internal Medicine

## 2020-02-26 DIAGNOSIS — Z7901 Long term (current) use of anticoagulants: Secondary | ICD-10-CM | POA: Diagnosis not present

## 2020-02-26 MED ORDER — DOFETILIDE 500 MCG PO CAPS: 500.0000 ug | ORAL_CAPSULE | Freq: Two times a day (BID) | ORAL | 9 refills | Status: DC

## 2020-02-26 NOTE — Telephone Encounter (Signed)
*  STAT* If patient is at the pharmacy, call can be transferred to refill team.   1. Which medications need to be refilled? (please list name of each medication and dose if known) dofetilide (TIKOSYN) 500 MCG capsule  2. Which pharmacy/location (including street and city if local pharmacy) is medication to be sent to? [place], [place]  3. Do they need a 30 day or 90 day supply? [place]

## 2020-02-26 NOTE — Telephone Encounter (Signed)
Pt's medication was sent to pt's pharmacy as requested. Confirmation received.  °

## 2020-02-28 DIAGNOSIS — E113293 Type 2 diabetes mellitus with mild nonproliferative diabetic retinopathy without macular edema, bilateral: Secondary | ICD-10-CM | POA: Diagnosis not present

## 2020-03-11 DIAGNOSIS — Z7901 Long term (current) use of anticoagulants: Secondary | ICD-10-CM | POA: Diagnosis not present

## 2020-03-17 DIAGNOSIS — I5022 Chronic systolic (congestive) heart failure: Secondary | ICD-10-CM | POA: Diagnosis not present

## 2020-03-17 DIAGNOSIS — I420 Dilated cardiomyopathy: Secondary | ICD-10-CM | POA: Diagnosis not present

## 2020-03-17 DIAGNOSIS — I2510 Atherosclerotic heart disease of native coronary artery without angina pectoris: Secondary | ICD-10-CM | POA: Diagnosis not present

## 2020-03-17 DIAGNOSIS — I10 Essential (primary) hypertension: Secondary | ICD-10-CM | POA: Diagnosis not present

## 2020-03-20 NOTE — Progress Notes (Signed)
Primary Care Physician: Dr Stoney Bang Primary Cardiologist: Dr Dot Lanes Primary Electrophysiologist: Dr Rayann Heman Referring Physician: Tommye Standard PA   Brian Bruce is a 74 y.o. male with a history of CAD (CABG 1998), DM, persistent Afib, ICM s/p ICD, chronic CHF (systolic), LBBB who presents for follow up in the [facility]. Patient seen in EP clinic post ICD implant with persistent afib. He is on warfarin for a CHADS2VASC score of 4. Patient would like to pursue Tikosyn. He is fairly unaware of his arrhythmia with no increased SOB, fatigue, palpitations, or heart racing. He denies significant snoring or alcohol use. Patient is s/p dofetilide loading 1/5-08/09/19. He converted to SR on the medication and did not require DCCV.   On follow up today, patient reports that he has done well since his last visit. He denies any bleeding issues with anticoagulation. His last device interrogation showed 0% mode switches.   Today, he denies symptoms of palpitations, chest pain, shortness of breath, orthopnea, PND, lower extremity edema, dizziness, presyncope, syncope, snoring, daytime somnolence, bleeding, or neurologic sequela. The patient is tolerating medications without difficulties and is otherwise without complaint today.    Atrial Fibrillation Risk Factors:  he does not have symptoms or diagnosis of sleep apnea. he does not have a history of rheumatic fever.   he has a BMI of Body mass index is 26.57 kg/m.Marland Kitchen Filed Weights   03/23/20 0903  Weight: 84 kg    Family History  Problem Relation Age of Onset  . Cancer Mother   . Heart failure Father      Atrial Fibrillation Management history:  Previous antiarrhythmic drugs: dofetilide Previous cardioversions: none Previous ablations: none CHADS2VASC score: 4 Anticoagulation history: warfarin    Past Medical History:  Diagnosis Date  . Asthma   . CHF (congestive heart failure) ([place])   . Coronary  artery disease   . Diabetes mellitus without complication ([place])   . Ischemic cardiomyopathy   . LBBB (left bundle branch block)   . Persistent atrial fibrillation [facility])    Past Surgical History:  Procedure Laterality Date  . BIV ICD INSERTION CRT-D N/A 05/16/2019   Procedure: BIV ICD INSERTION CRT-D;  Surgeon: Thompson Grayer, MD;  Location: [facility];  Service: Cardiovascular;  Laterality: N/A;  . CORONARY ARTERY BYPASS GRAFT  1998   4-vessel  . RIGHT/LEFT HEART CATH AND CORONARY/GRAFT ANGIOGRAPHY N/A 03/07/2019   Procedure: RIGHT/LEFT HEART CATH AND CORONARY/GRAFT ANGIOGRAPHY;  Surgeon: Nelva Bush, MD;  Location: [facility];  Service: Cardiovascular;  Laterality: N/A;    Current Outpatient Medications  Medication Sig Dispense Refill  . acetaminophen (TYLENOL) 500 MG tablet Take 500 mg by mouth every 6 (six) hours as needed for moderate pain.    Marland Kitchen amLODipine (NORVASC) 10 MG tablet Take 1 tablet by mouth daily.    Marland Kitchen aspirin EC 81 MG tablet Take 81 mg by mouth daily.    Marland Kitchen atorvastatin (LIPITOR) 80 MG tablet Take 80 mg by mouth every evening.    Marland Kitchen augmented betamethasone dipropionate (DIPROLENE-AF) 0.05 % cream Apply 1 application topically 2 (two) times daily as needed (rash).    . carvedilol (COREG) 25 MG tablet Take 25 mg by mouth 2 (two) times daily.     . Cholecalciferol (VITAMIN D) 50 MCG (2000 UT) tablet Take 2,000 Units by mouth daily.    . Coenzyme Q10 (CO Q-10) 100 MG CAPS Take 100 mg by mouth daily.    Marland Kitchen  digoxin (LANOXIN) 0.125 MG tablet Take 1 tablet (0.125 mg total) by mouth daily. 90 tablet 2  . dofetilide (TIKOSYN) 500 MCG capsule Take 1 capsule (500 mcg total) by mouth 2 (two) times daily. 60 capsule 9  . glimepiride (AMARYL) 2 MG tablet Take 1 tablet by mouth daily.    Marland Kitchen GLUCOSAMINE-CHONDROITIN PO Take 1 tablet by mouth 2 (two) times daily.    . hydrALAZINE (APRESOLINE) 25 MG tablet Take by mouth 3 (three) times daily.     Marland Kitchen ketotifen (ALAWAY) 0.025 %  ophthalmic solution Place 1 drop into both eyes daily.    . magnesium oxide (MAG-OX) 400 (241.3 Mg) MG tablet Take 1 tablet (400 mg total) by mouth 2 (two) times daily. 60 tablet 6  . metFORMIN (GLUCOPHAGE) 1000 MG tablet Take 1 tablet (1,000 mg total) by mouth 2 (two) times daily.    Marland Kitchen olmesartan (BENICAR) 20 MG tablet     . Omega-3 Fatty Acids (FISH OIL) 1000 MG CAPS Take 1,000 mg by mouth daily.    Marland Kitchen torsemide (DEMADEX) 20 MG tablet Take 1 tablet (20 mg total) by mouth daily. 90 tablet 2  . warfarin (COUMADIN) 5 MG tablet Take 7.5 mg by mouth daily.      No current facility-administered medications for this encounter.    No Known Allergies  Social History   Socioeconomic History  . Marital status: Widowed    Spouse name: Not on file  . Number of children: Not on file  . Years of education: Not on file  . Highest education level: Not on file  Occupational History  . Not on file  Tobacco Use  . Smoking status: Former Smoker    Types: Cigarettes    Quit date: 1980    Years since quitting: 41.6  . Smokeless tobacco: Never Used  Vaping Use  . Vaping Use: Never used  Substance and Sexual Activity  . Alcohol use: Yes    Alcohol/week: 2.0 - 3.0 standard drinks    Types: 2 - 3 Cans of beer per week    Comment: every evening  . Drug use: Never  . Sexual activity: Not on file  Other Topics Concern  . Not on file  Social History Narrative   Lives in [place] with spouse.  Retired.   Previously worked as a Designer, television/film set:   . Difficulty of Paying Living Expenses: Not on file  Food Insecurity:   . Worried About Charity fundraiser in the Last Year: Not on file  . Ran Out of Food in the Last Year: Not on file  Transportation Needs:   . Lack of Transportation (Medical): Not on file  . Lack of Transportation (Non-Medical): Not on file  Physical Activity:   . Days of Exercise per Week: Not on file  . Minutes of  Exercise per Session: Not on file  Stress:   . Feeling of Stress : Not on file  Social Connections:   . Frequency of Communication with Friends and Family: Not on file  . Frequency of Social Gatherings with Friends and Family: Not on file  . Attends Religious Services: Not on file  . Active Member of Clubs or Organizations: Not on file  . Attends Archivist Meetings: Not on file  . Marital Status: Not on file  Intimate Partner Violence:   . Fear of Current or Ex-Partner: Not on file  . Emotionally Abused: Not  on file  . Physically Abused: Not on file  . Sexually Abused: Not on file     ROS- All systems are reviewed and negative except as per the HPI above.  Physical Exam: Vitals:   03/23/20 0903  BP: (!) 142/60  Pulse: 63  Weight: 84 kg  Height: 5\' 10"  (1.778 m)    GEN- The patient is well appearing, alert and oriented x 3 today.   HEENT-head normocephalic, atraumatic, sclera clear, conjunctiva pink, hearing intact, trachea midline. Lungs- Clear to ausculation bilaterally, normal work of breathing Heart- Regular rate and rhythm, no murmurs, rubs or gallops  GI- soft, NT, ND, + BS Extremities- no clubbing, cyanosis, or edema MS- no significant deformity or atrophy Skin- no rash or lesion Psych- euthymic mood, full affect Neuro- strength and sensation are intact   Wt Readings from Last 3 Encounters:  03/23/20 84 kg  12/06/19 84.4 kg  11/20/19 84.2 kg    EKG today demonstrates AV dual paced rhythm HR 63, PR 182, QRS 164, QTc 505  Echo 11/23/18 demonstrated  Moderately dilated left ventricle with severely reduced LVEF. Moderately reduced RV with moderately reduced contraction. No significant pericardial effusion. Moderate to severe mitral regurgitation  Epic records are reviewed at length today  Assessment and Plan:  1. Persistent atrial fibrillation Patient is s/p dofetilide loading 1/5-08/09/19. Appears to be maintaining SR. Continue dofetilide 500  mcg BID. QT stable. Check bmet/mag today. Continue warfarin  This patients CHA2DS2-VASc Score and unadjusted Ischemic Stroke Rate (% per year) is equal to 4.8 % stroke rate/year from a score of 4  Above score calculated as 1 point each if present [CHF, HTN, DM, Vascular=MI/PAD/Aortic Plaque, Age if 65-74, or Male] Above score calculated as 2 points each if present [Age > 75, or Stroke/TIA/TE]  2. CAD No anginal symptoms.  3. ICM/Chronic systolic CHF S/p ICD, followed by Dr Rayann Heman and the device clinic. No signs or symptoms today.  4. HTN Stable, no changes today.   Follow up with Dr Rayann Heman as scheduled. AF clinic in 6 months.    [facility] [address] [place], [place] [address] [phone] 03/23/2020 9:21 AM

## 2020-03-23 ENCOUNTER — Encounter (HOSPITAL_COMMUNITY): Payer: Self-pay | Admitting: Physician Assistant

## 2020-03-23 ENCOUNTER — Ambulatory Visit (HOSPITAL_COMMUNITY)
Admission: RE | Admit: 2020-03-23 | Discharge: 2020-03-23 | Disposition: A | Discharge status: Home / Self Care | Payer: Medicare Other | Source: Ambulatory Visit | Attending: Physician Assistant | Admitting: Physician Assistant

## 2020-03-23 ENCOUNTER — Other Ambulatory Visit: Payer: Self-pay

## 2020-03-23 VITALS — BP 142/60 | HR 63 | Ht 70.0 in | Wt 185.2 lb

## 2020-03-23 DIAGNOSIS — Z951 Presence of aortocoronary bypass graft: Secondary | ICD-10-CM | POA: Diagnosis not present

## 2020-03-23 DIAGNOSIS — Z7984 Long term (current) use of oral hypoglycemic drugs: Secondary | ICD-10-CM | POA: Diagnosis not present

## 2020-03-23 DIAGNOSIS — E119 Type 2 diabetes mellitus without complications: Secondary | ICD-10-CM | POA: Diagnosis not present

## 2020-03-23 DIAGNOSIS — Z7982 Long term (current) use of aspirin: Secondary | ICD-10-CM | POA: Diagnosis not present

## 2020-03-23 DIAGNOSIS — Z95810 Presence of automatic (implantable) cardiac defibrillator: Secondary | ICD-10-CM | POA: Diagnosis not present

## 2020-03-23 DIAGNOSIS — Z79899 Other long term (current) drug therapy: Secondary | ICD-10-CM | POA: Insufficient documentation

## 2020-03-23 DIAGNOSIS — Z7901 Long term (current) use of anticoagulants: Secondary | ICD-10-CM | POA: Diagnosis not present

## 2020-03-23 DIAGNOSIS — I4819 Other persistent atrial fibrillation: Secondary | ICD-10-CM | POA: Insufficient documentation

## 2020-03-23 DIAGNOSIS — I5022 Chronic systolic (congestive) heart failure: Secondary | ICD-10-CM | POA: Diagnosis not present

## 2020-03-23 DIAGNOSIS — I2510 Atherosclerotic heart disease of native coronary artery without angina pectoris: Secondary | ICD-10-CM | POA: Insufficient documentation

## 2020-03-23 DIAGNOSIS — Z87891 Personal history of nicotine dependence: Secondary | ICD-10-CM | POA: Diagnosis not present

## 2020-03-23 DIAGNOSIS — I110 Hypertensive heart disease with heart failure: Secondary | ICD-10-CM | POA: Diagnosis not present

## 2020-03-23 DIAGNOSIS — D6869 Other thrombophilia: Secondary | ICD-10-CM

## 2020-03-23 LAB — MAGNESIUM: Magnesium: 1.8 mg/dL (ref 1.7–2.4)

## 2020-03-23 LAB — BASIC METABOLIC PANEL
Anion gap: 12 (ref 5–15)
BUN: 10 mg/dL (ref 8–23)
CO2: 23 mmol/L (ref 22–32)
Calcium: 9.1 mg/dL (ref 8.9–10.3)
Chloride: 98 mmol/L (ref 98–111)
Creatinine, Ser: 0.8 mg/dL (ref 0.61–1.24)
GFR calc Af Amer: >60 mL/min (ref >60)
GFR calc non Af Amer: >60 mL/min (ref >60)
Glucose, Bld: 150 mg/dL — ABNORMAL HIGH (ref 70–99)
Potassium: 4.1 mmol/L (ref 3.5–5.1)
Sodium: 133 mmol/L — ABNORMAL LOW (ref 135–145)

## 2020-03-30 ENCOUNTER — Ambulatory Visit (INDEPENDENT_AMBULATORY_CARE_PROVIDER_SITE_OTHER): Payer: Medicare Other

## 2020-03-30 DIAGNOSIS — I5022 Chronic systolic (congestive) heart failure: Secondary | ICD-10-CM | POA: Diagnosis not present

## 2020-03-30 DIAGNOSIS — Z95810 Presence of automatic (implantable) cardiac defibrillator: Secondary | ICD-10-CM

## 2020-03-31 NOTE — Progress Notes (Signed)
EPIC Encounter for ICM Monitoring  Patient Name: Brian Bruce is a 74 y.o. male Date: 03/31/2020 Primary Care Physican: Neale Burly, MD Primary [facility] Electrophysiologist:Allred Bi-V Pacing:98% 03/31/2020 Weight:180- 182lbs  AT/AF Burden0%  Spoke with patient and reports feeling well at this time. Denies fluid symptoms. He ate foods over the weekend that were high in salt.  CorVue thoracic impedancestarting downward trend suggesting possible fluid accumulation since 03/28/2020.  Prescribed:Torsemide 20 mg take 1 tablet daily.  Labs: 11/20/2019 Creatinine 0.77, BUN 10, Potassium 4.4, Sodium 131, GFR >60 08/16/2019 Creatinine0.81, BUN14, Potassium5.1, Sodium130, GFR>60 08/09/2019 Creatinine0.80, BUN14, Potassium4.6, Sodium132, GFR>60  08/08/2019 Creatinine0.91, BUN20, Potassium4.4, Sodium133, GFR>60  A complete set of results can be found in Results Review.  Recommendations: Advised to take 1 extra Torsemide tablet x 1 day and then return to 1 tablet daily.  Follow-up plan: ICM clinic phone appointment on 05/04/2020.   91 day device clinic remote transmission 05/14/2020.    EP/Cardiology Office Visits: 06/05/2020 with Dr. Rayann Heman.    Copy of ICM check sent to Dr. Rayann Heman.   3 month ICM trend: 03/30/2020    1 Year ICM trend:       Rosalene Billings, RN 03/31/2020 2:48 PM

## 2020-04-01 DIAGNOSIS — Z7901 Long term (current) use of anticoagulants: Secondary | ICD-10-CM | POA: Diagnosis not present

## 2020-04-23 DIAGNOSIS — E1159 Type 2 diabetes mellitus with other circulatory complications: Secondary | ICD-10-CM | POA: Diagnosis not present

## 2020-04-23 DIAGNOSIS — I5022 Chronic systolic (congestive) heart failure: Secondary | ICD-10-CM | POA: Diagnosis not present

## 2020-04-23 DIAGNOSIS — E7849 Other hyperlipidemia: Secondary | ICD-10-CM | POA: Diagnosis not present

## 2020-04-23 DIAGNOSIS — Z0000 Encounter for general adult medical examination without abnormal findings: Secondary | ICD-10-CM | POA: Diagnosis not present

## 2020-04-23 DIAGNOSIS — Z7901 Long term (current) use of anticoagulants: Secondary | ICD-10-CM | POA: Diagnosis not present

## 2020-04-23 DIAGNOSIS — Z1331 Encounter for screening for depression: Secondary | ICD-10-CM | POA: Diagnosis not present

## 2020-04-23 DIAGNOSIS — I10 Essential (primary) hypertension: Secondary | ICD-10-CM | POA: Diagnosis not present

## 2020-04-30 DIAGNOSIS — E083393 Diabetes mellitus due to underlying condition with moderate nonproliferative diabetic retinopathy without macular edema, bilateral: Secondary | ICD-10-CM | POA: Diagnosis not present

## 2020-05-04 ENCOUNTER — Ambulatory Visit (INDEPENDENT_AMBULATORY_CARE_PROVIDER_SITE_OTHER): Payer: Medicare Other

## 2020-05-04 DIAGNOSIS — I5022 Chronic systolic (congestive) heart failure: Secondary | ICD-10-CM | POA: Diagnosis not present

## 2020-05-04 DIAGNOSIS — Z95810 Presence of automatic (implantable) cardiac defibrillator: Secondary | ICD-10-CM

## 2020-05-05 NOTE — Progress Notes (Signed)
EPIC Encounter for ICM Monitoring  Patient Name: Brian Bruce is a 74 y.o. male Date: 05/05/2020 Primary Care Physican: Neale Burly, MD Primary [facility] Electrophysiologist:Allred Bi-V Pacing:97% 05/05/2020 Weight:184lbs  AT/AF Burden0%  Spoke with patient and reports feeling well at this time. Denies fluid symptoms.   CorVue thoracic impedancesuggesting normal fluid levels.  Prescribed:Torsemide 20 mg take 1 tablet daily.  Labs: 11/20/2019 Creatinine 0.77, BUN 10, Potassium 4.4, Sodium 131, GFR >60 08/16/2019 Creatinine0.81, BUN14, Potassium5.1, Sodium130, GFR>60 08/09/2019 Creatinine0.80, BUN14, Potassium4.6, Sodium132, GFR>60  08/08/2019 Creatinine0.91, BUN20, Potassium4.4, Sodium133, GFR>60  A complete set of results can be found in Results Review.  Recommendations: No changes and encouraged to call if experiencing any fluid symptoms.  Follow-up plan: ICM clinic phone appointment on 06/08/2020. 91 day device clinic remote transmission 05/14/2020.   EP/Cardiology Office Visits:06/05/2020 with Dr.Allred.   Copy of ICM check sent to Dr.Allred.    3 month ICM trend: 05/04/2020    1 Year ICM trend:       Rosalene Billings, RN 05/05/2020 9:19 AM

## 2020-05-08 DIAGNOSIS — Z23 Encounter for immunization: Secondary | ICD-10-CM | POA: Diagnosis not present

## 2020-05-14 ENCOUNTER — Ambulatory Visit (INDEPENDENT_AMBULATORY_CARE_PROVIDER_SITE_OTHER): Payer: Medicare Other

## 2020-05-14 DIAGNOSIS — I255 Ischemic cardiomyopathy: Secondary | ICD-10-CM | POA: Diagnosis not present

## 2020-05-15 DIAGNOSIS — Z7901 Long term (current) use of anticoagulants: Secondary | ICD-10-CM | POA: Diagnosis not present

## 2020-05-16 LAB — CUP PACEART REMOTE DEVICE CHECK
Battery Remaining Longevity: 65 mo
Battery Remaining Percentage: 83 %
Battery Voltage: 2.96 V
Brady Statistic AP VP Percent: 90 %
Brady Statistic AP VS Percent: 1 %
Brady Statistic AS VP Percent: 7.1 %
Brady Statistic AS VS Percent: 1.9 %
Brady Statistic RA Percent Paced: 91 %
Date Time Interrogation Session: 20211016020425
HighPow Impedance: 63 Ohm
Implantable Lead Implant Date: 20201015
Implantable Lead Implant Date: 20201015
Implantable Lead Implant Date: 20201015
Implantable Lead Location: 753858
Implantable Lead Location: 753859
Implantable Lead Location: 753860
Implantable Pulse Generator Implant Date: 20201015
Lead Channel Impedance Value: 440 Ohm
Lead Channel Impedance Value: 460 Ohm
Lead Channel Impedance Value: 500 Ohm
Lead Channel Pacing Threshold Amplitude: 0.75 V
Lead Channel Pacing Threshold Amplitude: 1 V
Lead Channel Pacing Threshold Amplitude: 1.25 V
Lead Channel Pacing Threshold Pulse Width: 0.5 ms
Lead Channel Pacing Threshold Pulse Width: 0.5 ms
Lead Channel Pacing Threshold Pulse Width: 0.8 ms
Lead Channel Sensing Intrinsic Amplitude: 12 mV
Lead Channel Sensing Intrinsic Amplitude: 3.2 mV
Lead Channel Setting Pacing Amplitude: 2 V
Lead Channel Setting Pacing Amplitude: 2.5 V
Lead Channel Setting Pacing Amplitude: 2.5 V
Lead Channel Setting Pacing Pulse Width: 0.5 ms
Lead Channel Setting Pacing Pulse Width: 0.8 ms
Lead Channel Setting Sensing Sensitivity: 0.5 mV
Pulse Gen Serial Number: 111006847

## 2020-05-19 NOTE — Progress Notes (Signed)
Remote ICD transmission.   

## 2020-05-25 DIAGNOSIS — Z23 Encounter for immunization: Secondary | ICD-10-CM | POA: Diagnosis not present

## 2020-06-03 DIAGNOSIS — Z7901 Long term (current) use of anticoagulants: Secondary | ICD-10-CM | POA: Diagnosis not present

## 2020-06-05 ENCOUNTER — Ambulatory Visit (INDEPENDENT_AMBULATORY_CARE_PROVIDER_SITE_OTHER): Payer: Medicare Other | Admitting: Internal Medicine

## 2020-06-05 ENCOUNTER — Other Ambulatory Visit: Payer: Self-pay

## 2020-06-05 ENCOUNTER — Other Ambulatory Visit (HOSPITAL_COMMUNITY)
Admission: RE | Admit: 2020-06-05 | Discharge: 2020-06-05 | Disposition: A | Discharge status: Home / Self Care | Payer: Medicare Other | Source: Ambulatory Visit | Attending: Internal Medicine | Admitting: Internal Medicine

## 2020-06-05 ENCOUNTER — Encounter: Payer: Self-pay | Admitting: Internal Medicine

## 2020-06-05 VITALS — BP 142/60 | HR 60 | Ht 70.0 in | Wt 167.0 lb

## 2020-06-05 DIAGNOSIS — I5022 Chronic systolic (congestive) heart failure: Secondary | ICD-10-CM

## 2020-06-05 DIAGNOSIS — I4819 Other persistent atrial fibrillation: Secondary | ICD-10-CM | POA: Diagnosis not present

## 2020-06-05 DIAGNOSIS — Z79899 Other long term (current) drug therapy: Secondary | ICD-10-CM | POA: Insufficient documentation

## 2020-06-05 DIAGNOSIS — I255 Ischemic cardiomyopathy: Secondary | ICD-10-CM

## 2020-06-05 DIAGNOSIS — I428 Other cardiomyopathies: Secondary | ICD-10-CM

## 2020-06-05 LAB — CUP PACEART INCLINIC DEVICE CHECK
Date Time Interrogation Session: 20211105142218
Implantable Lead Implant Date: 20201015
Implantable Lead Implant Date: 20201015
Implantable Lead Implant Date: 20201015
Implantable Lead Location: 753858
Implantable Lead Location: 753859
Implantable Lead Location: 753860
Implantable Pulse Generator Implant Date: 20201015
Pulse Gen Serial Number: 111006847

## 2020-06-05 LAB — BASIC METABOLIC PANEL
Anion gap: 10 (ref 5–15)
BUN: 11 mg/dL (ref 8–23)
CO2: 27 mmol/L (ref 22–32)
Calcium: 9 mg/dL (ref 8.9–10.3)
Chloride: 98 mmol/L (ref 98–111)
Creatinine, Ser: 0.86 mg/dL (ref 0.61–1.24)
GFR, Estimated: >60 mL/min (ref >60)
Glucose, Bld: 126 mg/dL — ABNORMAL HIGH (ref 70–99)
Potassium: 3.9 mmol/L (ref 3.5–5.1)
Sodium: 135 mmol/L (ref 135–145)

## 2020-06-05 LAB — DIGOXIN LEVEL: Digoxin Level: 0.9 ng/mL — ABNORMAL LOW (ref 1.0–2.0)

## 2020-06-05 LAB — MAGNESIUM: Magnesium: 2.2 mg/dL (ref 1.7–2.4)

## 2020-06-05 NOTE — Patient Instructions (Addendum)
Medication Instructions:   Your physician recommends that you continue on your current medications as directed. Please refer to the Current Medication list given to you today.  Labwork:  Your physician recommends that you return for lab work in: as soon as possible to check your BMET, Mg, Dig levels. This may be done at [facility] or [facility] ([place]) Monday-Friday from 8:00 am - 4:00 pm. No appointment is needed.  Testing/Procedures:  None  Follow-Up:  Your physician recommends that you schedule a follow-up appointment in: 1 year with Dr. Rayann Heman.  Your physician recommends that you schedule a follow-up appointment in: 6 months in A-fib Clinic  Any Other Special Instructions Will Be Listed Below (If Applicable).  If you need a refill on your cardiac medications before your next appointment, please call your pharmacy.

## 2020-06-05 NOTE — Progress Notes (Signed)
PCP: Neale Burly, MD Primary Cardiologist: Dr Candis Musa Primary EP: Dr Lucious Groves is a 74 y.o. male who presents today for routine electrophysiology followup.  Since last being seen in our clinic, the patient reports doing very well.  He has responded clinically to CRT.  Today, he denies symptoms of palpitations, chest pain, shortness of breath,  lower extremity edema, dizziness, presyncope, syncope, or ICD shocks.  The patient is otherwise without complaint today.   Past Medical History:  Diagnosis Date  . Asthma   . CHF (congestive heart failure) ([place])   . Coronary artery disease   . Diabetes mellitus without complication ([place])   . Ischemic cardiomyopathy   . LBBB (left bundle branch block)   . Persistent atrial fibrillation [facility])    Past Surgical History:  Procedure Laterality Date  . BIV ICD INSERTION CRT-D N/A 05/16/2019   Procedure: BIV ICD INSERTION CRT-D;  Surgeon: Thompson Grayer, MD;  Location: [facility];  Service: Cardiovascular;  Laterality: N/A;  . CORONARY ARTERY BYPASS GRAFT  1998   4-vessel  . RIGHT/LEFT HEART CATH AND CORONARY/GRAFT ANGIOGRAPHY N/A 03/07/2019   Procedure: RIGHT/LEFT HEART CATH AND CORONARY/GRAFT ANGIOGRAPHY;  Surgeon: Nelva Bush, MD;  Location: [facility];  Service: Cardiovascular;  Laterality: N/A;    ROS- all systems are reviewed and negative except as per HPI above  Current Outpatient Medications  Medication Sig Dispense Refill  . acetaminophen (TYLENOL) 500 MG tablet Take 500 mg by mouth every 6 (six) hours as needed for moderate pain.    Marland Kitchen amLODipine (NORVASC) 10 MG tablet Take 1 tablet by mouth daily.    Marland Kitchen aspirin EC 81 MG tablet Take 81 mg by mouth daily.    Marland Kitchen atorvastatin (LIPITOR) 80 MG tablet Take 80 mg by mouth every evening.    Marland Kitchen augmented betamethasone dipropionate (DIPROLENE-AF) 0.05 % cream Apply 1 application topically 2 (two) times daily as needed (rash).    . Cholecalciferol (VITAMIN D) 50 MCG  (2000 UT) tablet Take 2,000 Units by mouth daily.    . Coenzyme Q10 (CO Q-10) 100 MG CAPS Take 100 mg by mouth daily.    . digoxin (LANOXIN) 0.125 MG tablet Take 1 tablet (0.125 mg total) by mouth daily. 90 tablet 2  . dofetilide (TIKOSYN) 500 MCG capsule Take 1 capsule (500 mcg total) by mouth 2 (two) times daily. 60 capsule 9  . glimepiride (AMARYL) 2 MG tablet Take 1 tablet by mouth daily.    Marland Kitchen GLUCOSAMINE-CHONDROITIN PO Take 1 tablet by mouth 2 (two) times daily.    . hydrALAZINE (APRESOLINE) 25 MG tablet Take by mouth 3 (three) times daily.     Marland Kitchen ketotifen (ALAWAY) 0.025 % ophthalmic solution Place 1 drop into both eyes daily.    . magnesium oxide (MAG-OX) 400 (241.3 Mg) MG tablet Take 1 tablet (400 mg total) by mouth 2 (two) times daily. 60 tablet 6  . metFORMIN (GLUCOPHAGE) 1000 MG tablet Take 1 tablet (1,000 mg total) by mouth 2 (two) times daily.    Marland Kitchen olmesartan (BENICAR) 20 MG tablet     . torsemide (DEMADEX) 20 MG tablet Take 1 tablet (20 mg total) by mouth daily. 90 tablet 2  . warfarin (COUMADIN) 5 MG tablet Take 7.5 mg by mouth daily. except Thursday 10 mg     No current facility-administered medications for this visit.    Physical Exam: Vitals:   06/05/20 0813  BP: (!) 142/60  Pulse: 60  SpO2: 97%  Weight: 167 lb (75.8 kg)  Height: 5\' 10"  (1.778 m)    GEN- The patient is well appearing, alert and oriented x 3 today.   Head- normocephalic, atraumatic Eyes-  Sclera clear, conjunctiva pink Ears- hearing intact Oropharynx- clear Lungs- Clear to ausculation bilaterally, normal work of breathing Chest- ICD pocket is well healed Heart- Regular rate and rhythm, no murmurs, rubs or gallops, PMI not laterally displaced GI- soft, NT, ND, + BS Extremities- no clubbing, cyanosis, or edema  ICD interrogation- reviewed in detail today,  See PACEART report  ekg tracing ordered today is personally reviewed and shows sinus with BiV pacing  Wt Readings from Last 3 Encounters:   06/05/20 167 lb (75.8 kg)  03/23/20 185 lb 3.2 oz (84 kg)  12/06/19 186 lb (84.4 kg)    Assessment and Plan:  1.  Chronic systolic dysfunction/ ischemic CM/ CAD euvolemic today Stable on an appropriate medical regimen No ischemic symptoms Good response to CRT (EF 50-55% post CRT) Normal ICD function See Pace Art report No changes today he is not device dependant today followed in ICM device clinic  2. Persistent afib On tikosyn Qt is stable Labs from 8/21 are reviewed afib burden is 0 % We will follow closely on tikosyn chads2vasc score is 4.  He is on xarelto  Risks, benefits and potential toxicities for medications prescribed and/or refilled reviewed with patient today.   afib clinic in 6 months for tikosyn management I will see in a year Follow closely with Dr Candis Musa also for CHF and CAD.  Thompson Grayer MD, [facility] 06/05/2020 8:53 AM

## 2020-06-08 ENCOUNTER — Ambulatory Visit (INDEPENDENT_AMBULATORY_CARE_PROVIDER_SITE_OTHER): Payer: Medicare Other

## 2020-06-08 DIAGNOSIS — I5022 Chronic systolic (congestive) heart failure: Secondary | ICD-10-CM

## 2020-06-08 DIAGNOSIS — Z95810 Presence of automatic (implantable) cardiac defibrillator: Secondary | ICD-10-CM | POA: Diagnosis not present

## 2020-06-09 NOTE — Progress Notes (Signed)
EPIC Encounter for ICM Monitoring  Patient Name: Brian Bruce is a 74 y.o. male Date: 06/09/2020 Primary Care Physican: Neale Burly, MD Primary [facility] Electrophysiologist:Allred Bi-V Pacing:97% 06/09/2020 Weight:184lbs  AT/AF Burden0%  Spoke with patient and reports feeling well at this time. Denies fluid symptoms.  He may have been eating foods high in salt this past weekend.  CorVue thoracic impedancesuggesting possible fluid accumulation starting 06/07/2020.  Prescribed:Torsemide 20 mg take 1 tablet daily.  Labs: 06/05/2020 Creatinine 0.86, BUN 11, Potassium 3.9, Sodium 133, GFR >60 11/20/2019 Creatinine 0.77, BUN 10, Potassium 4.4, Sodium 131, GFR >60 08/16/2019 Creatinine0.81, BUN14, Potassium5.1, Sodium130, GFR>60 08/09/2019 Creatinine0.80, BUN14, Potassium4.6, Sodium132, GFR>60  08/08/2019 Creatinine0.91, BUN20, Potassium4.4, Sodium133, GFR>60  A complete set of results can be found in Results Review.  Recommendations: Advised to take 2 Furosemide tablets x 2 days and then return to one tablet daily.  Advised to limit salt intake.  Follow-up plan: ICM clinic phone appointment on11/16/2021. 91 day device clinic remote transmission 08/13/2020.   EP/Cardiology Office Visits:06/04/2021 with Dr.Allred.   Copy of ICM check sent to Dr.Allred.   3 month ICM trend: 06/08/2020    1 Year ICM trend:       Rosalene Billings, RN 06/09/2020 4:17 PM

## 2020-06-16 ENCOUNTER — Telehealth: Payer: Self-pay

## 2020-06-16 ENCOUNTER — Ambulatory Visit (INDEPENDENT_AMBULATORY_CARE_PROVIDER_SITE_OTHER): Payer: Medicare Other

## 2020-06-16 DIAGNOSIS — I5022 Chronic systolic (congestive) heart failure: Secondary | ICD-10-CM

## 2020-06-16 DIAGNOSIS — Z95810 Presence of automatic (implantable) cardiac defibrillator: Secondary | ICD-10-CM

## 2020-06-16 NOTE — Progress Notes (Signed)
EPIC Encounter for ICM Monitoring  Patient Name: Brian Bruce is a 74 y.o. male Date: 06/16/2020 Primary Care Physican: Neale Burly, MD Primary [facility] Electrophysiologist:Allred Bi-V Pacing:90% 06/09/2020 Weight:184lbs  AT/AF Burden0%  Attempted call to patient and unable to reach. Transmission reviewed.   CorVue thoracic impedancesuggesting fluid levels returned to normal after taking extra Furosemide x 2 days.  Prescribed:Torsemide 20 mg take 1 tablet daily.  Labs: 06/05/2020 Creatinine 0.86, BUN 11, Potassium 3.9, Sodium 133, GFR >60 11/20/2019 Creatinine 0.77, BUN 10, Potassium 4.4, Sodium 131, GFR >60 08/16/2019 Creatinine0.81, BUN14, Potassium5.1, Sodium130, GFR>60 08/09/2019 Creatinine0.80, BUN14, Potassium4.6, Sodium132, GFR>60  08/08/2019 Creatinine0.91, BUN20, Potassium4.4, Sodium133, GFR>60  A complete set of results can be found in Results Review.  Recommendations: Unable to reach.    Follow-up plan: ICM clinic phone appointment on12/13/2021. 91 day device clinic remote transmission 08/13/2020.   EP/Cardiology Office Visits:06/04/2021 with Dr.Allred.   Copy of ICM check sent to Dr.Allred.    1 Year ICM trend:       Rosalene Billings, RN 06/16/2020 2:05 PM

## 2020-06-16 NOTE — Telephone Encounter (Signed)
Remote ICM transmission received.  Attempted call to patient regarding ICM remote transmission and no voice mail set up.  

## 2020-07-01 DIAGNOSIS — Z7901 Long term (current) use of anticoagulants: Secondary | ICD-10-CM | POA: Diagnosis not present

## 2020-07-13 ENCOUNTER — Ambulatory Visit (INDEPENDENT_AMBULATORY_CARE_PROVIDER_SITE_OTHER): Payer: Medicare Other

## 2020-07-13 DIAGNOSIS — Z95810 Presence of automatic (implantable) cardiac defibrillator: Secondary | ICD-10-CM

## 2020-07-13 DIAGNOSIS — I5022 Chronic systolic (congestive) heart failure: Secondary | ICD-10-CM

## 2020-07-15 NOTE — Progress Notes (Signed)
EPIC Encounter for ICM Monitoring  Patient Name: Brian Bruce is a 74 y.o. male Date: 07/15/2020 Primary Care Physican: Neale Burly, MD Primary [facility] Electrophysiologist:Allred Bi-V Pacing:92% 07/15/2020 Weight:187lbs  AT/AF Burden0%  Spoke with patient and reports feeling well at this time.  Denies fluid symptoms.    CorVue thoracic impedancenormal.  Prescribed:Torsemide 20 mg take 1 tablet daily.  Labs: 06/05/2020 Creatinine 0.86, BUN 11, Potassium 3.9, Sodium 133, GFR >60 11/20/2019 Creatinine 0.77, BUN 10, Potassium 4.4, Sodium 131, GFR >60 08/16/2019 Creatinine0.81, BUN14, Potassium5.1, Sodium130, GFR>60 08/09/2019 Creatinine0.80, BUN14, Potassium4.6, Sodium132, GFR>60  08/08/2019 Creatinine0.91, BUN20, Potassium4.4, Sodium133, GFR>60  A complete set of results can be found in Results Review.  Recommendations: No changes and encouraged to call if experiencing any fluid symptoms.  Follow-up plan: ICM clinic phone appointment on1/17/2022. 91 day device clinic remote transmission1/13/2022.   EP/Cardiology Office Visits:11/4/2022with Dr.Allred.   Copy of ICM check sent to Dr.Allred.  3 month ICM trend: 07/13/2020    1 Year ICM trend:       Rosalene Billings, RN 07/15/2020 11:36 AM

## 2020-07-23 DIAGNOSIS — Z7901 Long term (current) use of anticoagulants: Secondary | ICD-10-CM | POA: Diagnosis not present

## 2020-07-23 DIAGNOSIS — I10 Essential (primary) hypertension: Secondary | ICD-10-CM | POA: Diagnosis not present

## 2020-07-23 DIAGNOSIS — E1159 Type 2 diabetes mellitus with other circulatory complications: Secondary | ICD-10-CM | POA: Diagnosis not present

## 2020-07-23 DIAGNOSIS — Z0000 Encounter for general adult medical examination without abnormal findings: Secondary | ICD-10-CM | POA: Diagnosis not present

## 2020-07-23 DIAGNOSIS — I5022 Chronic systolic (congestive) heart failure: Secondary | ICD-10-CM | POA: Diagnosis not present

## 2020-07-23 DIAGNOSIS — Z125 Encounter for screening for malignant neoplasm of prostate: Secondary | ICD-10-CM | POA: Diagnosis not present

## 2020-07-23 DIAGNOSIS — E7849 Other hyperlipidemia: Secondary | ICD-10-CM | POA: Diagnosis not present

## 2020-07-30 DIAGNOSIS — H3540 Unspecified peripheral retinal degeneration: Secondary | ICD-10-CM | POA: Diagnosis not present

## 2020-07-30 DIAGNOSIS — H524 Presbyopia: Secondary | ICD-10-CM | POA: Diagnosis not present

## 2020-07-30 DIAGNOSIS — E113293 Type 2 diabetes mellitus with mild nonproliferative diabetic retinopathy without macular edema, bilateral: Secondary | ICD-10-CM | POA: Diagnosis not present

## 2020-07-30 DIAGNOSIS — H35033 Hypertensive retinopathy, bilateral: Secondary | ICD-10-CM | POA: Diagnosis not present

## 2020-07-30 DIAGNOSIS — H2513 Age-related nuclear cataract, bilateral: Secondary | ICD-10-CM | POA: Diagnosis not present

## 2020-07-30 DIAGNOSIS — H52223 Regular astigmatism, bilateral: Secondary | ICD-10-CM | POA: Diagnosis not present

## 2020-07-30 DIAGNOSIS — H35372 Puckering of macula, left eye: Secondary | ICD-10-CM | POA: Diagnosis not present

## 2020-07-30 DIAGNOSIS — H5203 Hypermetropia, bilateral: Secondary | ICD-10-CM | POA: Diagnosis not present

## 2020-07-30 DIAGNOSIS — H35363 Drusen (degenerative) of macula, bilateral: Secondary | ICD-10-CM | POA: Diagnosis not present

## 2020-07-30 DIAGNOSIS — I10 Essential (primary) hypertension: Secondary | ICD-10-CM | POA: Diagnosis not present

## 2020-07-30 DIAGNOSIS — H35361 Drusen (degenerative) of macula, right eye: Secondary | ICD-10-CM | POA: Diagnosis not present

## 2020-07-30 DIAGNOSIS — H43392 Other vitreous opacities, left eye: Secondary | ICD-10-CM | POA: Diagnosis not present

## 2020-08-04 DIAGNOSIS — Z23 Encounter for immunization: Secondary | ICD-10-CM | POA: Diagnosis not present

## 2020-08-10 ENCOUNTER — Other Ambulatory Visit: Payer: Self-pay | Admitting: Internal Medicine

## 2020-08-12 DIAGNOSIS — Z7901 Long term (current) use of anticoagulants: Secondary | ICD-10-CM | POA: Diagnosis not present

## 2020-08-13 ENCOUNTER — Ambulatory Visit (INDEPENDENT_AMBULATORY_CARE_PROVIDER_SITE_OTHER): Payer: Medicare Other

## 2020-08-13 DIAGNOSIS — I255 Ischemic cardiomyopathy: Secondary | ICD-10-CM | POA: Diagnosis not present

## 2020-08-13 LAB — CUP PACEART REMOTE DEVICE CHECK
Battery Remaining Longevity: 67 mo
Battery Remaining Percentage: 80 %
Battery Voltage: 2.96 V
Brady Statistic AP VP Percent: 87 %
Brady Statistic AP VS Percent: 1 %
Brady Statistic AS VP Percent: 7.6 %
Brady Statistic AS VS Percent: 4.2 %
Brady Statistic RA Percent Paced: 88 %
Date Time Interrogation Session: 20220113020102
HighPow Impedance: 65 Ohm
Implantable Lead Implant Date: 20201015
Implantable Lead Implant Date: 20201015
Implantable Lead Implant Date: 20201015
Implantable Lead Location: 753858
Implantable Lead Location: 753859
Implantable Lead Location: 753860
Implantable Pulse Generator Implant Date: 20201015
Lead Channel Impedance Value: 430 Ohm
Lead Channel Impedance Value: 460 Ohm
Lead Channel Impedance Value: 540 Ohm
Lead Channel Pacing Threshold Amplitude: 0.75 V
Lead Channel Pacing Threshold Amplitude: 1 V
Lead Channel Pacing Threshold Amplitude: 1.375 V
Lead Channel Pacing Threshold Pulse Width: 0.5 ms
Lead Channel Pacing Threshold Pulse Width: 0.5 ms
Lead Channel Pacing Threshold Pulse Width: 0.8 ms
Lead Channel Sensing Intrinsic Amplitude: 12 mV
Lead Channel Sensing Intrinsic Amplitude: 2.1 mV
Lead Channel Setting Pacing Amplitude: 1.75 V
Lead Channel Setting Pacing Amplitude: 1.875
Lead Channel Setting Pacing Amplitude: 2 V
Lead Channel Setting Pacing Pulse Width: 0.5 ms
Lead Channel Setting Pacing Pulse Width: 0.8 ms
Lead Channel Setting Sensing Sensitivity: 0.5 mV
Pulse Gen Serial Number: 111006847

## 2020-08-17 ENCOUNTER — Ambulatory Visit (INDEPENDENT_AMBULATORY_CARE_PROVIDER_SITE_OTHER): Payer: Medicare Other

## 2020-08-17 DIAGNOSIS — Z95810 Presence of automatic (implantable) cardiac defibrillator: Secondary | ICD-10-CM | POA: Diagnosis not present

## 2020-08-17 DIAGNOSIS — I5022 Chronic systolic (congestive) heart failure: Secondary | ICD-10-CM | POA: Diagnosis not present

## 2020-08-17 NOTE — Progress Notes (Signed)
EPIC Encounter for ICM Monitoring  Patient Name: Brian Bruce is a 75 y.o. male Date: 08/17/2020 Primary Care Physican: Neale Burly, MD Primary [facility] Electrophysiologist:Allred Bi-V Pacing:92% 07/15/2020 Weight:187lbs  AT/AF Burden0%  Spoke with patient and reports feeling well at this time.  Denies fluid symptoms.    CorVue thoracic impedancesuggesting possible fluid accumulation starting on 08/16/2020.  Prescribed:Torsemide 20 mg take 1 tablet daily.  Labs: 06/05/2020 Creatinine 0.86, BUN 11, Potassium 3.9, Sodium 133, GFR >60 11/20/2019 Creatinine 0.77, BUN 10, Potassium 4.4, Sodium 131, GFR >60 08/16/2019 Creatinine0.81, BUN14, Potassium5.1, Sodium130, GFR>60 08/09/2019 Creatinine0.80, BUN14, Potassium4.6, Sodium132, GFR>60  08/08/2019 Creatinine0.91, BUN20, Potassium4.4, Sodium133, GFR>60  A complete set of results can be found in Results Review.  Recommendations:  Advised to take 1 extra Torsemide x 1 day and then return to taking 1 tablet daily as prescribed.  Follow-up plan: ICM clinic phone appointment on2/21/2022. 91 day device clinic remote transmission4/14/2022.   EP/Cardiology Office Visits:11/4/2022with Dr.Allred.   Copy of ICM check sent to Dr.Allred.  3 month ICM trend: 08/17/2020.    1 Year ICM trend:       Rosalene Billings, RN 08/17/2020 12:59 PM

## 2020-08-19 ENCOUNTER — Other Ambulatory Visit: Payer: Self-pay | Admitting: Internal Medicine

## 2020-08-26 NOTE — Progress Notes (Signed)
Remote ICD transmission.   

## 2020-09-06 ENCOUNTER — Other Ambulatory Visit (HOSPITAL_COMMUNITY): Payer: Self-pay | Admitting: Physician Assistant

## 2020-09-17 DIAGNOSIS — I10 Essential (primary) hypertension: Secondary | ICD-10-CM | POA: Diagnosis not present

## 2020-09-17 DIAGNOSIS — I255 Ischemic cardiomyopathy: Secondary | ICD-10-CM | POA: Diagnosis not present

## 2020-09-17 DIAGNOSIS — I5022 Chronic systolic (congestive) heart failure: Secondary | ICD-10-CM | POA: Diagnosis not present

## 2020-09-17 DIAGNOSIS — I4811 Longstanding persistent atrial fibrillation: Secondary | ICD-10-CM | POA: Diagnosis not present

## 2020-09-17 DIAGNOSIS — I2510 Atherosclerotic heart disease of native coronary artery without angina pectoris: Secondary | ICD-10-CM | POA: Diagnosis not present

## 2020-09-21 ENCOUNTER — Ambulatory Visit (INDEPENDENT_AMBULATORY_CARE_PROVIDER_SITE_OTHER): Payer: Medicare Other

## 2020-09-21 DIAGNOSIS — Z95810 Presence of automatic (implantable) cardiac defibrillator: Secondary | ICD-10-CM

## 2020-09-21 DIAGNOSIS — I5022 Chronic systolic (congestive) heart failure: Secondary | ICD-10-CM

## 2020-09-21 NOTE — Progress Notes (Signed)
EPIC Encounter for ICM Monitoring  Patient Name: Brian Bruce is a 75 y.o. male Date: 09/21/2020 Primary Care Physican: Neale Burly, MD Primary [facility] Electrophysiologist:Allred Bi-V Pacing:96% 07/15/2020 Weight:187lbs   AT/AF Burden0%  Attempted call to patient and unable to reach.  Left detailed message per DPR regarding transmission. Transmission reviewed.   CorVue thoracic impedancesuggesting possible fluid accumulation starting on 09/18/2020.  Prescribed:Torsemide 20 mg take 1 tablet daily.  Labs: 06/05/2020 Creatinine 0.86, BUN 11, Potassium 3.9, Sodium 133, GFR >60 11/20/2019 Creatinine 0.77, BUN 10, Potassium 4.4, Sodium 131, GFR >60 08/16/2019 Creatinine0.81, BUN14, Potassium5.1, Sodium130, GFR>60 08/09/2019 Creatinine0.80, BUN14, Potassium4.6, Sodium132, GFR>60  08/08/2019 Creatinine0.91, BUN20, Potassium4.4, Sodium133, GFR>60  A complete set of results can be found in Results Review.  Recommendations:  Left voice mail with ICM number and encouraged to call if experiencing any fluid symptoms.  Will advise to take extra Torsemide x 2-3 days if reached.  Follow-up plan: ICM clinic phone appointment on3/08/2020 to recheck fluid levels. 91 day device clinic remote transmission4/14/2022.   EP/Cardiology Office Visits:  11/4/2022with Dr.Allred.   Copy of ICM check sent to Dr.Allred.   3 month ICM trend: 09/21/2020.    1 Year ICM trend:       Rosalene Billings, RN 09/21/2020 4:47 PM

## 2020-09-23 DIAGNOSIS — Z7901 Long term (current) use of anticoagulants: Secondary | ICD-10-CM | POA: Diagnosis not present

## 2020-09-29 ENCOUNTER — Ambulatory Visit (INDEPENDENT_AMBULATORY_CARE_PROVIDER_SITE_OTHER): Payer: Medicare Other

## 2020-09-29 DIAGNOSIS — I5022 Chronic systolic (congestive) heart failure: Secondary | ICD-10-CM

## 2020-09-29 DIAGNOSIS — Z95810 Presence of automatic (implantable) cardiac defibrillator: Secondary | ICD-10-CM

## 2020-09-29 NOTE — Progress Notes (Signed)
EPIC Encounter for ICM Monitoring  Patient Name: Brian Bruce is a 75 y.o. male Date: 09/29/2020 Primary Care Physican: Neale Burly, MD Primary [facility] Electrophysiologist:Allred Bi-V Pacing:96% 09/29/2020 Weight:185lbs   AT/AF Burden0%  Spoke with patient and reports feeling well at this time.  Denies fluid symptoms.  He was asymptomatic during decreased impedance from 2/17-2/22.   CorVue thoracic impedancesuggesting fluid levels returned normal.  Prescribed:Torsemide 20 mg take 1 tablet daily.  Labs: 06/05/2020 Creatinine 0.86, BUN 11, Potassium 3.9, Sodium 133, GFR >60 11/20/2019 Creatinine 0.77, BUN 10, Potassium 4.4, Sodium 131, GFR >60 08/16/2019 Creatinine0.81, BUN14, Potassium5.1, Sodium130, GFR>60 08/09/2019 Creatinine0.80, BUN14, Potassium4.6, Sodium132, GFR>60  08/08/2019 Creatinine0.91, BUN20, Potassium4.4, Sodium133, GFR>60  A complete set of results can be found in Results Review.  Recommendations: No changes and encouraged to call if experiencing any fluid symptoms.  Follow-up plan: ICM clinic phone appointment on4/10/2020. 91 day device clinic remote transmission4/14/2022.   EP/Cardiology Office Visits:  11/4/2022with Dr.Allred.   Copy of ICM check sent to Dr.Allred.    Direct Trend View Data through 09/28/2020    Rosalene Billings, RN 09/29/2020 9:35 AM

## 2020-10-22 DIAGNOSIS — I10 Essential (primary) hypertension: Secondary | ICD-10-CM | POA: Diagnosis not present

## 2020-10-22 DIAGNOSIS — E1159 Type 2 diabetes mellitus with other circulatory complications: Secondary | ICD-10-CM | POA: Diagnosis not present

## 2020-10-22 DIAGNOSIS — E7849 Other hyperlipidemia: Secondary | ICD-10-CM | POA: Diagnosis not present

## 2020-10-22 DIAGNOSIS — I5022 Chronic systolic (congestive) heart failure: Secondary | ICD-10-CM | POA: Diagnosis not present

## 2020-10-22 DIAGNOSIS — Z7901 Long term (current) use of anticoagulants: Secondary | ICD-10-CM | POA: Diagnosis not present

## 2020-11-03 NOTE — Progress Notes (Signed)
ICM remote transmission rescheduled from 11/02/2020 to 11/16/2020.

## 2020-11-12 ENCOUNTER — Ambulatory Visit (INDEPENDENT_AMBULATORY_CARE_PROVIDER_SITE_OTHER): Payer: Medicare Other

## 2020-11-12 DIAGNOSIS — I428 Other cardiomyopathies: Secondary | ICD-10-CM

## 2020-11-12 DIAGNOSIS — I4819 Other persistent atrial fibrillation: Secondary | ICD-10-CM

## 2020-11-12 LAB — CUP PACEART REMOTE DEVICE CHECK
Battery Remaining Longevity: 68 mo
Battery Remaining Percentage: 77 %
Battery Voltage: 2.96 V
Brady Statistic AP VP Percent: 89 %
Brady Statistic AP VS Percent: 1 %
Brady Statistic AS VP Percent: 7.3 %
Brady Statistic AS VS Percent: 2.6 %
Brady Statistic RA Percent Paced: 90 %
Date Time Interrogation Session: 20220414020023
HighPow Impedance: 56 Ohm
Implantable Lead Implant Date: 20201015
Implantable Lead Implant Date: 20201015
Implantable Lead Implant Date: 20201015
Implantable Lead Location: 753858
Implantable Lead Location: 753859
Implantable Lead Location: 753860
Implantable Pulse Generator Implant Date: 20201015
Lead Channel Impedance Value: 410 Ohm
Lead Channel Impedance Value: 430 Ohm
Lead Channel Impedance Value: 490 Ohm
Lead Channel Pacing Threshold Amplitude: 0.75 V
Lead Channel Pacing Threshold Amplitude: 1 V
Lead Channel Pacing Threshold Amplitude: 1.25 V
Lead Channel Pacing Threshold Pulse Width: 0.5 ms
Lead Channel Pacing Threshold Pulse Width: 0.5 ms
Lead Channel Pacing Threshold Pulse Width: 0.8 ms
Lead Channel Sensing Intrinsic Amplitude: 12 mV
Lead Channel Sensing Intrinsic Amplitude: 2.3 mV
Lead Channel Setting Pacing Amplitude: 1.75 V
Lead Channel Setting Pacing Amplitude: 1.75 V
Lead Channel Setting Pacing Amplitude: 2 V
Lead Channel Setting Pacing Pulse Width: 0.5 ms
Lead Channel Setting Pacing Pulse Width: 0.8 ms
Lead Channel Setting Sensing Sensitivity: 0.5 mV
Pulse Gen Serial Number: 111006847

## 2020-11-16 ENCOUNTER — Ambulatory Visit (INDEPENDENT_AMBULATORY_CARE_PROVIDER_SITE_OTHER): Payer: Medicare Other

## 2020-11-16 DIAGNOSIS — Z95810 Presence of automatic (implantable) cardiac defibrillator: Secondary | ICD-10-CM

## 2020-11-16 DIAGNOSIS — I5022 Chronic systolic (congestive) heart failure: Secondary | ICD-10-CM | POA: Diagnosis not present

## 2020-11-16 NOTE — Progress Notes (Signed)
EPIC Encounter for ICM Monitoring  Patient Name: Brian Bruce is a 75 y.o. male Date: 11/16/2020 Primary Care Physican: Neale Burly, MD Primary [facility] Electrophysiologist:Allred Bi-V Pacing:97% 11/16/2020 Weight:187lbs   AT/AF Burden0%  Spoke with patient and reports weight gain of 2 pounds in the last 2 days.    CorVue thoracic impedancesuggesting possible fluid accumulation 11/14/2020.  Prescribed:Torsemide 20 mg take 1 tablet daily.  Labs: 06/05/2020 Creatinine 0.86, BUN 11, Potassium 3.9, Sodium 133, GFR >60 11/20/2019 Creatinine 0.77, BUN 10, Potassium 4.4, Sodium 131, GFR >60 08/16/2019 Creatinine0.81, BUN14, Potassium5.1, Sodium130, GFR>60 08/09/2019 Creatinine0.80, BUN14, Potassium4.6, Sodium132, GFR>60  08/08/2019 Creatinine0.91, BUN20, Potassium4.4, Sodium133, GFR>60  A complete set of results can be found in Results Review.  Recommendations: Advised to take 2 Torsemide tablets x 2 days and he said he normal takes 1.5 if he needs to take extra.  Agreed he can take 1.5 Torsemide x 2 days and then return to   Follow-up plan: ICM clinic phone appointment on4/22/2022 Manual send to recheck fluid levels. 91 day device clinic remote transmission7/14/2022.   EP/Cardiology Office Visits: 11/4/2022with Dr.Allred.   Copy of ICM check sent to Dr.Allred.  3 month ICM trend: 09/21/2020.    1 Year ICM trend:       Rosalene Billings, RN 11/16/2020 12:03 PM

## 2020-11-17 ENCOUNTER — Other Ambulatory Visit: Payer: Self-pay | Admitting: Internal Medicine

## 2020-11-20 ENCOUNTER — Ambulatory Visit (INDEPENDENT_AMBULATORY_CARE_PROVIDER_SITE_OTHER): Payer: Medicare Other

## 2020-11-20 DIAGNOSIS — I5022 Chronic systolic (congestive) heart failure: Secondary | ICD-10-CM

## 2020-11-20 DIAGNOSIS — Z95810 Presence of automatic (implantable) cardiac defibrillator: Secondary | ICD-10-CM

## 2020-11-20 NOTE — Progress Notes (Signed)
EPIC Encounter for ICM Monitoring  Patient Name: Brian Bruce is a 75 y.o. male Date: 11/20/2020 Primary Care Physican: Neale Burly, MD Primary [facility] Electrophysiologist:Allred Bi-V Pacing:97% 4/18/2022Weight:187lbs  11/20/2020 Weight: 185 lbs  AT/AF Burden0%  Spoke with patient and reports weight dropped 2 lbs.  Denies fluid symptoms.      CorVue thoracic impedancesuggestingfluid levels returned to normal after taking 1.5 tablets x 2 days.  Prescribed:Torsemide 20 mg take 1 tablet daily.  Labs: 06/05/2020 Creatinine 0.86, BUN 11, Potassium 3.9, Sodium 133, GFR >60 11/20/2019 Creatinine 0.77, BUN 10, Potassium 4.4, Sodium 131, GFR >60 08/16/2019 Creatinine0.81, BUN14, Potassium5.1, Sodium130, GFR>60 08/09/2019 Creatinine0.80, BUN14, Potassium4.6, Sodium132, GFR>60  08/08/2019 Creatinine0.91, BUN20, Potassium4.4, Sodium133, GFR>60  A complete set of results can be found in Results Review.  Recommendations: No changes and encouraged to call if experiencing any fluid symptoms.  Follow-up plan: ICM clinic phone appointment on5/24/2022. 91 day device clinic remote transmission7/14/2022.   EP/Cardiology Office Visits: 11/4/2022with Dr.Allred.   Copy of ICM check sent to Dr.Allred.  3 month ICM trend: 11/20/2020.    1 Year ICM trend:       Rosalene Billings, RN 11/20/2020 8:16 AM

## 2020-11-27 NOTE — Progress Notes (Signed)
Remote ICD transmission.   

## 2020-12-02 NOTE — Progress Notes (Signed)
Primary Care Physician: Dr Stoney Bang Primary Cardiologist: Dr Dot Lanes Primary Electrophysiologist: Dr Rayann Heman Referring Physician: Tommye Standard PA   Brian Bruce is a 75 y.o. male with a history of CAD (CABG 1998), DM, persistent Afib, ICM s/p ICD, chronic CHF (systolic), LBBB who presents for follow up in the [facility]. Patient seen in EP clinic post ICD implant with persistent afib. He is on warfarin for a CHADS2VASC score of 4. Patient would like to pursue Tikosyn. He is fairly unaware of his arrhythmia with no increased SOB, fatigue, palpitations, or heart racing. He denies significant snoring or alcohol use. Patient is s/p dofetilide loading 1/5-08/09/19. He converted to SR on the medication and did not require DCCV.   On follow up today, patient reports he has done well since his last visit. He denies any symptoms of palpitations or heart racing. His afib burden 0% on last device check. He denies any bleeding issues on anticoagulation.   Today, he denies symptoms of palpitations, chest pain, shortness of breath, orthopnea, PND, lower extremity edema, dizziness, presyncope, syncope, snoring, daytime somnolence, bleeding, or neurologic sequela. The patient is tolerating medications without difficulties and is otherwise without complaint today.    Atrial Fibrillation Risk Factors:  he does not have symptoms or diagnosis of sleep apnea. he does not have a history of rheumatic fever.   he has a BMI of Body mass index is 27.23 kg/m.Marland Kitchen Filed Weights   12/03/20 0920  Weight: 86.1 kg    Family History  Problem Relation Age of Onset  . Cancer Mother   . Heart failure Father      Atrial Fibrillation Management history:  Previous antiarrhythmic drugs: dofetilide Previous cardioversions: none Previous ablations: none CHADS2VASC score: 4 Anticoagulation history: warfarin    Past Medical History:  Diagnosis Date  . Asthma   . CHF (congestive  heart failure) ([place])   . Coronary artery disease   . Diabetes mellitus without complication ([place])   . Ischemic cardiomyopathy   . LBBB (left bundle branch block)   . Persistent atrial fibrillation [facility])    Past Surgical History:  Procedure Laterality Date  . BIV ICD INSERTION CRT-D N/A 05/16/2019   Procedure: BIV ICD INSERTION CRT-D;  Surgeon: Thompson Grayer, MD;  Location: [place] [facility];  Service: Cardiovascular;  Laterality: N/A;  . CORONARY ARTERY BYPASS GRAFT  1998   4-vessel  . RIGHT/LEFT HEART CATH AND CORONARY/GRAFT ANGIOGRAPHY N/A 03/07/2019   Procedure: RIGHT/LEFT HEART CATH AND CORONARY/GRAFT ANGIOGRAPHY;  Surgeon: Nelva Bush, MD;  Location: [place] [facility];  Service: Cardiovascular;  Laterality: N/A;    Current Outpatient Medications  Medication Sig Dispense Refill  . acetaminophen (TYLENOL) 500 MG tablet Take 500 mg by mouth every 6 (six) hours as needed for moderate pain.    Marland Kitchen amLODipine (NORVASC) 10 MG tablet Take 1 tablet by mouth daily.    Marland Kitchen aspirin EC 81 MG tablet Take 81 mg by mouth daily.    Marland Kitchen atorvastatin (LIPITOR) 80 MG tablet Take 80 mg by mouth every evening.    Marland Kitchen augmented betamethasone dipropionate (DIPROLENE-AF) 0.05 % cream Apply 1 application topically 2 (two) times daily as needed (rash).    . carvedilol (COREG) 25 MG tablet Take 1 tablet by mouth 2 (two) times daily.    . Cholecalciferol (VITAMIN D) 50 MCG (2000 UT) tablet Take 2,000 Units by mouth daily.    . Coenzyme Q10 (CO Q-10) 100 MG CAPS Take 100 mg  by mouth daily.    . digoxin (LANOXIN) 0.125 MG tablet Take 1 tablet by mouth once daily 90 tablet 3  . dofetilide (TIKOSYN) 500 MCG capsule Take 1 capsule (500 mcg total) by mouth 2 (two) times daily. 60 capsule 9  . glimepiride (AMARYL) 2 MG tablet Take 1 tablet by mouth daily.    Marland Kitchen GLUCOSAMINE-CHONDROITIN PO Take 1 tablet by mouth 2 (two) times daily.    . hydrALAZINE (APRESOLINE) 25 MG tablet Take by mouth 3 (three) times daily.     Marland Kitchen  ketotifen (ZADITOR) 0.025 % ophthalmic solution Place 1 drop into both eyes daily.    . magnesium oxide (MAG-OX) 400 (241.3 Mg) MG tablet Take 1 tablet by mouth twice daily 180 tablet 0  . metFORMIN (GLUCOPHAGE) 1000 MG tablet Take 1 tablet (1,000 mg total) by mouth 2 (two) times daily.    Marland Kitchen olmesartan (BENICAR) 20 MG tablet     . torsemide (DEMADEX) 20 MG tablet Take 1 tablet by mouth once daily 90 tablet 2  . warfarin (COUMADIN) 5 MG tablet Take 7.5 mg by mouth daily. except Thursday 10 mg     No current facility-administered medications for this encounter.    No Known Allergies  Social History   Socioeconomic History  . Marital status: Widowed    Spouse name: Not on file  . Number of children: Not on file  . Years of education: Not on file  . Highest education level: Not on file  Occupational History  . Not on file  Tobacco Use  . Smoking status: Former Smoker    Types: Cigarettes    Quit date: 1980    Years since quitting: 42.3  . Smokeless tobacco: Never Used  Vaping Use  . Vaping Use: Never used  Substance and Sexual Activity  . Alcohol use: Yes    Alcohol/week: 2.0 - 3.0 standard drinks    Types: 2 - 3 Cans of beer per week    Comment: every evening  . Drug use: Never  . Sexual activity: Not on file  Other Topics Concern  . Not on file  Social History Narrative   Lives in [place] with spouse.  Retired.   Previously worked as a Designer, television/film set: Not on [employer] Insecurity: Not on file  Transportation Needs: Not on file  Physical Activity: Not on file  Stress: Not on file  Social Connections: Not on file  Intimate Partner Violence: Not on file     ROS- All systems are reviewed and negative except as per the HPI above.  Physical Exam: Vitals:   12/03/20 0920  BP: (!) 142/62  Pulse: 62  Weight: 86.1 kg  Height: 5\' 10"  (1.778 m)    GEN- The patient is a well appearing male, alert and  oriented x 3 today.  HEENT-head normocephalic, atraumatic, sclera clear, conjunctiva pink, hearing intact, trachea midline. Lungs- Clear to ausculation bilaterally, normal work of breathing Heart- Regular rate and rhythm, no rubs or gallops, 2/6 systolic murmur   GI- soft, NT, ND, + BS Extremities- no clubbing, cyanosis, or edema MS- no significant deformity or atrophy Skin- no rash or lesion Psych- euthymic mood, full affect Neuro- strength and sensation are intact   Wt Readings from Last 3 Encounters:  12/03/20 86.1 kg  06/05/20 75.8 kg  03/23/20 84 kg    EKG today demonstrates AV dual paced rhythm  Vent. rate 62 BPM PR  interval 182 ms QRS duration 166 ms QT/QTcB 496/503 ms  Echo 12/27/19 demonstrated ([facility]) 1. The left ventricle is normal in size with mildly increased wall  thickness.  2. The left ventricular systolic function is overall normal, LVEF is  visually estimated at 50-55%.  3. There is grade I diastolic dysfunction (impaired relaxation).  4. There is moderate aortic valve stenosis.  5. The left atrium is moderately dilated in size.  6. The right ventricle is normal in size, with normal systolic function.  7. The right atrium is moderately dilated in size.  8. Overall left ventricular systolic function has normalized in comparison  to the study dated 03/2019.    Epic records are reviewed at length today  Assessment and Plan:  1. Persistent atrial fibrillation Patient is s/p dofetilide loading 1/5-08/09/19. Patient maintaining SR with 0% afib burden on device. Continue dofetilide 500 mcg BID. QT stable. Check bmet/mag. Continue warfarin  This patients CHA2DS2-VASc Score and unadjusted Ischemic Stroke Rate (% per year) is equal to 4.8 % stroke rate/year from a score of 4  Above score calculated as 1 point each if present [CHF, HTN, DM, Vascular=MI/PAD/Aortic Plaque, Age if 65-74, or Male] Above score calculated as 2 points each if present  [Age > 75, or Stroke/TIA/TE]  2. CAD No anginal symptoms.  3. ICM/Chronic systolic CHF S/p ICD, followed by Dr Rayann Heman and the device clinic. No signs or symptoms of fluid overload.  4. HTN Stable, no changes today.   Follow up with Dr Rayann Heman as scheduled. AF clinic in one year.    [facility] [address] [place], [place] [address] [phone] 12/03/2020 9:28 AM

## 2020-12-03 ENCOUNTER — Ambulatory Visit (HOSPITAL_COMMUNITY)
Admission: RE | Admit: 2020-12-03 | Discharge: 2020-12-03 | Disposition: A | Discharge status: Home / Self Care | Payer: Medicare Other | Source: Ambulatory Visit | Attending: Physician Assistant | Admitting: Physician Assistant

## 2020-12-03 ENCOUNTER — Other Ambulatory Visit: Payer: Self-pay

## 2020-12-03 ENCOUNTER — Encounter (HOSPITAL_COMMUNITY): Payer: Self-pay | Admitting: Physician Assistant

## 2020-12-03 VITALS — BP 142/62 | HR 62 | Ht 70.0 in | Wt 189.8 lb

## 2020-12-03 DIAGNOSIS — I4819 Other persistent atrial fibrillation: Secondary | ICD-10-CM | POA: Diagnosis not present

## 2020-12-03 DIAGNOSIS — Z7901 Long term (current) use of anticoagulants: Secondary | ICD-10-CM | POA: Insufficient documentation

## 2020-12-03 DIAGNOSIS — Z79899 Other long term (current) drug therapy: Secondary | ICD-10-CM | POA: Insufficient documentation

## 2020-12-03 DIAGNOSIS — Z951 Presence of aortocoronary bypass graft: Secondary | ICD-10-CM | POA: Diagnosis not present

## 2020-12-03 DIAGNOSIS — Z7984 Long term (current) use of oral hypoglycemic drugs: Secondary | ICD-10-CM | POA: Insufficient documentation

## 2020-12-03 DIAGNOSIS — Z7982 Long term (current) use of aspirin: Secondary | ICD-10-CM | POA: Diagnosis not present

## 2020-12-03 DIAGNOSIS — E119 Type 2 diabetes mellitus without complications: Secondary | ICD-10-CM | POA: Diagnosis not present

## 2020-12-03 DIAGNOSIS — I447 Left bundle-branch block, unspecified: Secondary | ICD-10-CM | POA: Insufficient documentation

## 2020-12-03 DIAGNOSIS — I2510 Atherosclerotic heart disease of native coronary artery without angina pectoris: Secondary | ICD-10-CM | POA: Insufficient documentation

## 2020-12-03 DIAGNOSIS — I5022 Chronic systolic (congestive) heart failure: Secondary | ICD-10-CM | POA: Diagnosis not present

## 2020-12-03 DIAGNOSIS — I110 Hypertensive heart disease with heart failure: Secondary | ICD-10-CM | POA: Diagnosis not present

## 2020-12-03 DIAGNOSIS — D6869 Other thrombophilia: Secondary | ICD-10-CM | POA: Diagnosis not present

## 2020-12-03 DIAGNOSIS — Z95810 Presence of automatic (implantable) cardiac defibrillator: Secondary | ICD-10-CM | POA: Insufficient documentation

## 2020-12-03 LAB — BASIC METABOLIC PANEL
Anion gap: 9 (ref 5–15)
BUN: 7 mg/dL — ABNORMAL LOW (ref 8–23)
CO2: 27 mmol/L (ref 22–32)
Calcium: 9.1 mg/dL (ref 8.9–10.3)
Chloride: 100 mmol/L (ref 98–111)
Creatinine, Ser: 0.67 mg/dL (ref 0.61–1.24)
GFR, Estimated: >60 mL/min (ref >60)
Glucose, Bld: 131 mg/dL — ABNORMAL HIGH (ref 70–99)
Potassium: 4 mmol/L (ref 3.5–5.1)
Sodium: 136 mmol/L (ref 135–145)

## 2020-12-03 LAB — MAGNESIUM: Magnesium: 1.9 mg/dL (ref 1.7–2.4)

## 2020-12-08 ENCOUNTER — Other Ambulatory Visit (HOSPITAL_COMMUNITY): Payer: Self-pay | Admitting: Internal Medicine

## 2020-12-16 ENCOUNTER — Other Ambulatory Visit: Payer: Self-pay | Admitting: Internal Medicine

## 2020-12-21 ENCOUNTER — Ambulatory Visit (INDEPENDENT_AMBULATORY_CARE_PROVIDER_SITE_OTHER): Payer: Medicare Other

## 2020-12-21 DIAGNOSIS — I5022 Chronic systolic (congestive) heart failure: Secondary | ICD-10-CM

## 2020-12-21 DIAGNOSIS — Z95810 Presence of automatic (implantable) cardiac defibrillator: Secondary | ICD-10-CM | POA: Diagnosis not present

## 2020-12-22 NOTE — Progress Notes (Signed)
EPIC Encounter for ICM Monitoring  Patient Name: Brian Bruce is a 75 y.o. male Date: 12/22/2020 Primary Care Physican: Neale Burly, MD Primary [facility] Electrophysiologist:Allred Bi-V Pacing:97% 4/18/2022Weight:187lbs  11/20/2020 Weight: 185 lbs 12/22/2020 Weight: 187 lbs   AT/AF Burden0%  Spoke with patient and reports feeling well at this time.  Denies fluid symptoms.      CorVue thoracic impedancesuggestingpossible fluid accumulation starting 12/18/2020.  Prescribed:Torsemide 20 mg take 1 tablet daily.  Labs: 06/05/2020 Creatinine 0.86, BUN 11, Potassium 3.9, Sodium 133, GFR >60 11/20/2019 Creatinine 0.77, BUN 10, Potassium 4.4, Sodium 131, GFR >60 08/16/2019 Creatinine0.81, BUN14, Potassium5.1, Sodium130, GFR>60 08/09/2019 Creatinine0.80, BUN14, Potassium4.6, Sodium132, GFR>60  08/08/2019 Creatinine0.91, BUN20, Potassium4.4, Sodium133, GFR>60  A complete set of results can be found in Results Review.  Recommendations: Advised to take extra Torsemide x 2 days and then return to 1 tablet daily.  Follow-up plan: ICM clinic phone appointment on6/08/2020 to recheck fluid levels. 91 day device clinic remote transmission7/14/2022.   EP/Cardiology Office Visits: 11/4/2022with Dr.Allred.   Copy of ICM check sent to Dr.Allred.  3 month ICM trend: 12/21/2020.    1 Year ICM trend:       Rosalene Billings, RN 12/22/2020 9:35 AM

## 2020-12-30 ENCOUNTER — Ambulatory Visit (INDEPENDENT_AMBULATORY_CARE_PROVIDER_SITE_OTHER): Payer: Medicare Other

## 2020-12-30 DIAGNOSIS — Z95810 Presence of automatic (implantable) cardiac defibrillator: Secondary | ICD-10-CM

## 2020-12-30 DIAGNOSIS — I5022 Chronic systolic (congestive) heart failure: Secondary | ICD-10-CM

## 2020-12-30 NOTE — Progress Notes (Signed)
EPIC Encounter for ICM Monitoring  Patient Name: Brian Bruce is a 75 y.o. male Date: 12/30/2020 Primary Care Physican: Neale Burly, MD Primary [facility] Electrophysiologist:Allred Bi-V Pacing:97% 12/22/2020 Weight: 187 lbs   AT/AF Burden0%  Spoke with patient and reports feeling well at this time. Heart failure questions reviewed. Pt asymptomatic.  CorVue thoracic impedancesuggestingfluid levels returned to normal after taking extra Torsemide x 2 days.  Prescribed:Torsemide 20 mg take 1 tablet daily.  Labs: 06/05/2020 Creatinine 0.86, BUN 11, Potassium 3.9, Sodium 133, GFR >60 11/20/2019 Creatinine 0.77, BUN 10, Potassium 4.4, Sodium 131, GFR >60 08/16/2019 Creatinine0.81, BUN14, Potassium5.1, Sodium130, GFR>60 08/09/2019 Creatinine0.80, BUN14, Potassium4.6, Sodium132, GFR>60  08/08/2019 Creatinine0.91, BUN20, Potassium4.4, Sodium133, GFR>60  A complete set of results can be found in Results Review.  Recommendations: No changes and encouraged to call if experiencing any fluid symptoms.  Follow-up plan: ICM clinic phone appointment on6/27/2022. 91 day device clinic remote transmission7/14/2022.   EP/Cardiology Office Visits: 11/4/2022with Dr.Allred.   Copy of ICM check sent to Dr.Allred.  3 month ICM trend: 12/30/2020.    1 Year ICM trend:       Rosalene Billings, RN 12/30/2020 8:38 AM

## 2021-01-11 DIAGNOSIS — J0110 Acute frontal sinusitis, unspecified: Secondary | ICD-10-CM | POA: Diagnosis not present

## 2021-01-11 DIAGNOSIS — I10 Essential (primary) hypertension: Secondary | ICD-10-CM | POA: Diagnosis not present

## 2021-01-11 DIAGNOSIS — I5022 Chronic systolic (congestive) heart failure: Secondary | ICD-10-CM | POA: Diagnosis not present

## 2021-01-11 DIAGNOSIS — J328 Other chronic sinusitis: Secondary | ICD-10-CM | POA: Diagnosis not present

## 2021-01-11 DIAGNOSIS — E1159 Type 2 diabetes mellitus with other circulatory complications: Secondary | ICD-10-CM | POA: Diagnosis not present

## 2021-01-11 DIAGNOSIS — E7849 Other hyperlipidemia: Secondary | ICD-10-CM | POA: Diagnosis not present

## 2021-01-11 DIAGNOSIS — Z7901 Long term (current) use of anticoagulants: Secondary | ICD-10-CM | POA: Diagnosis not present

## 2021-01-15 DIAGNOSIS — E083293 Diabetes mellitus due to underlying condition with mild nonproliferative diabetic retinopathy without macular edema, bilateral: Secondary | ICD-10-CM | POA: Diagnosis not present

## 2021-01-25 ENCOUNTER — Ambulatory Visit (INDEPENDENT_AMBULATORY_CARE_PROVIDER_SITE_OTHER): Payer: Medicare Other

## 2021-01-25 DIAGNOSIS — Z95810 Presence of automatic (implantable) cardiac defibrillator: Secondary | ICD-10-CM

## 2021-01-25 DIAGNOSIS — I5022 Chronic systolic (congestive) heart failure: Secondary | ICD-10-CM

## 2021-01-26 NOTE — Progress Notes (Signed)
EPIC Encounter for ICM Monitoring  Patient Name: Brian Bruce is a 75 y.o. male Date: 01/26/2021 Primary Care Physican: Neale Burly, MD Primary Cardiologist: [facility] Electrophysiologist: Allred Bi-V Pacing: 98%            01/26/2021 Weight: 188 lbs    AT/AF Burden 0%                                                          Spoke with patient and reports feeling well at this time. Heart failure questions reviewed. Pt asymptomatic but did have a head cold for a couple of days which has resolved.   CorVue thoracic impedance normal fluid levels.   Prescribed: Torsemide 20 mg take 1 tablet daily.   Labs: 12/03/2020 Creatinine 0.67, BUN 7, Potassium 4.0, Sodium 136, GFR >60 A complete set of results can be found in Results Review.   Recommendations:  No changes and encouraged to call if experiencing any fluid symptoms.   Follow-up plan: ICM clinic phone appointment on 03/01/2021.   91 day device clinic remote transmission 02/11/2021.     EP/Cardiology Office Visits:  06/04/2021 with Dr. Rayann Heman.     Copy of ICM check sent to Dr. Rayann Heman.    3 month ICM trend: 01/25/2021.    1 Year ICM trend:       Rosalene Billings, RN 01/26/2021 3:09 PM

## 2021-02-05 DIAGNOSIS — Z7901 Long term (current) use of anticoagulants: Secondary | ICD-10-CM | POA: Diagnosis not present

## 2021-02-07 IMAGING — CR DG CHEST 2V
2 series · 2 of 2 positions shown · non-contrast
Comparison: 11/09/2018

CLINICAL DATA: ICD placement

EXAM:
CHEST - 2 VIEW

[chest pa]
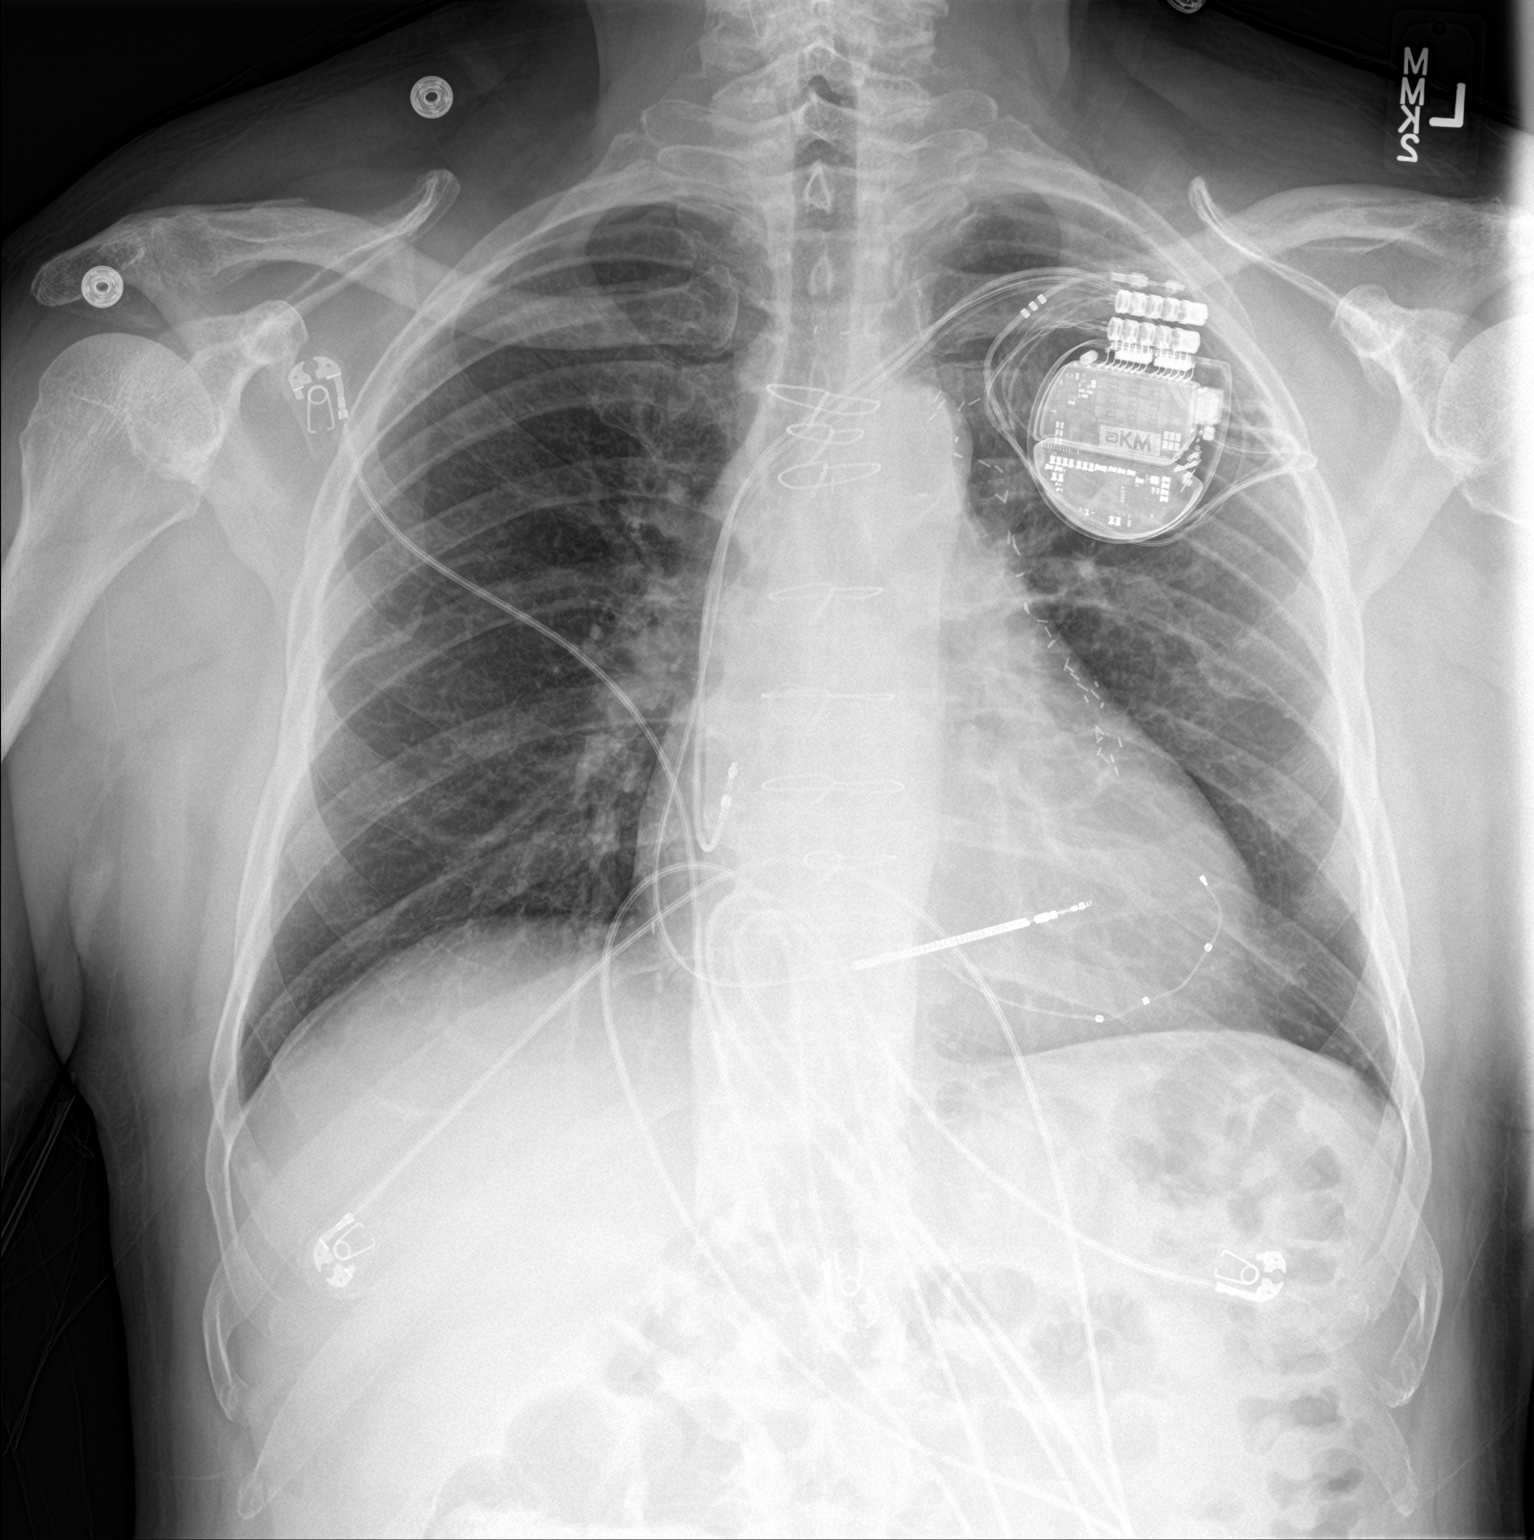

[chest lat]
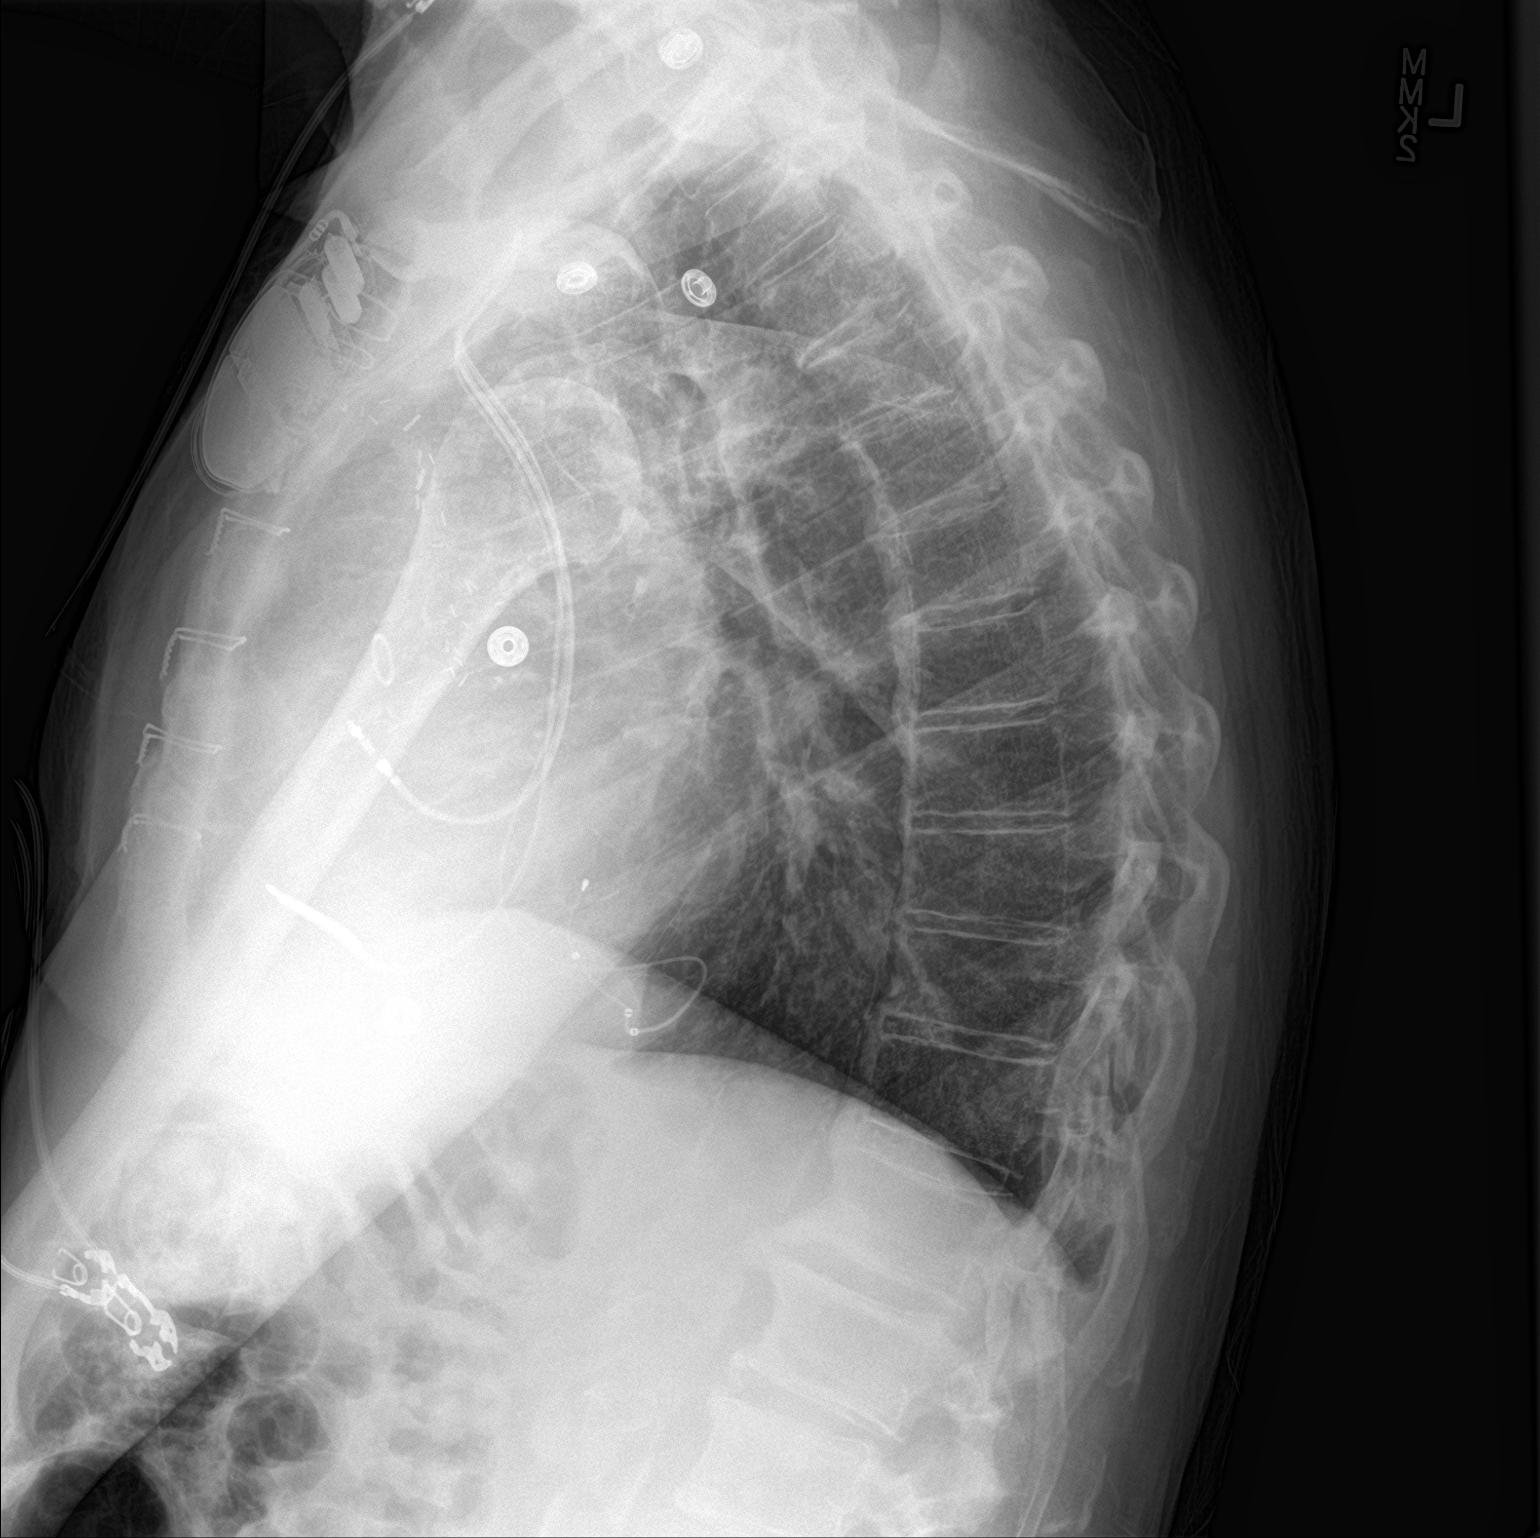

[2 of 2 positions shown; findings below may reference images not displayed]

FINDINGS: Interval placement of ICD with leads in the right atrium, right
ventricle and coronary sinus. No pneumothorax. Cardiomegaly. Prior
CABG. Lungs clear. No effusions or acute bony abnormality.
IMPRESSION: ICD placement.  No pneumothorax.

Cardiomegaly.

No active disease.

## 2021-02-11 ENCOUNTER — Ambulatory Visit (INDEPENDENT_AMBULATORY_CARE_PROVIDER_SITE_OTHER): Payer: Medicare Other

## 2021-02-11 DIAGNOSIS — I5022 Chronic systolic (congestive) heart failure: Secondary | ICD-10-CM

## 2021-02-15 LAB — CUP PACEART REMOTE DEVICE CHECK
Battery Remaining Longevity: 64 mo
Battery Remaining Percentage: 74 %
Battery Voltage: 2.96 V
Brady Statistic AP VP Percent: 90 %
Brady Statistic AP VS Percent: 1 %
Brady Statistic AS VP Percent: 7.4 %
Brady Statistic AS VS Percent: 1.8 %
Brady Statistic RA Percent Paced: 91 %
Date Time Interrogation Session: 20220716064640
HighPow Impedance: 59 Ohm
Implantable Lead Implant Date: 20201015
Implantable Lead Implant Date: 20201015
Implantable Lead Implant Date: 20201015
Implantable Lead Location: 753858
Implantable Lead Location: 753859
Implantable Lead Location: 753860
Implantable Pulse Generator Implant Date: 20201015
Lead Channel Impedance Value: 400 Ohm
Lead Channel Impedance Value: 410 Ohm
Lead Channel Impedance Value: 480 Ohm
Lead Channel Pacing Threshold Amplitude: 0.875 V
Lead Channel Pacing Threshold Amplitude: 1 V
Lead Channel Pacing Threshold Amplitude: 1.5 V
Lead Channel Pacing Threshold Pulse Width: 0.5 ms
Lead Channel Pacing Threshold Pulse Width: 0.5 ms
Lead Channel Pacing Threshold Pulse Width: 0.8 ms
Lead Channel Sensing Intrinsic Amplitude: 12 mV
Lead Channel Sensing Intrinsic Amplitude: 3.6 mV
Lead Channel Setting Pacing Amplitude: 1.875
Lead Channel Setting Pacing Amplitude: 2 V
Lead Channel Setting Pacing Amplitude: 2 V
Lead Channel Setting Pacing Pulse Width: 0.5 ms
Lead Channel Setting Pacing Pulse Width: 0.8 ms
Lead Channel Setting Sensing Sensitivity: 0.5 mV
Pulse Gen Serial Number: 111006847

## 2021-02-18 IMAGING — DX DG CHEST 2V
2 series · 2 of 2 positions shown · non-contrast
Comparison: 05/17/2019

CLINICAL DATA: Follow-up ICD. Congestive heart failure.

EXAM:
CHEST - 2 VIEW

[dg chest 2 view (1 of 2)]
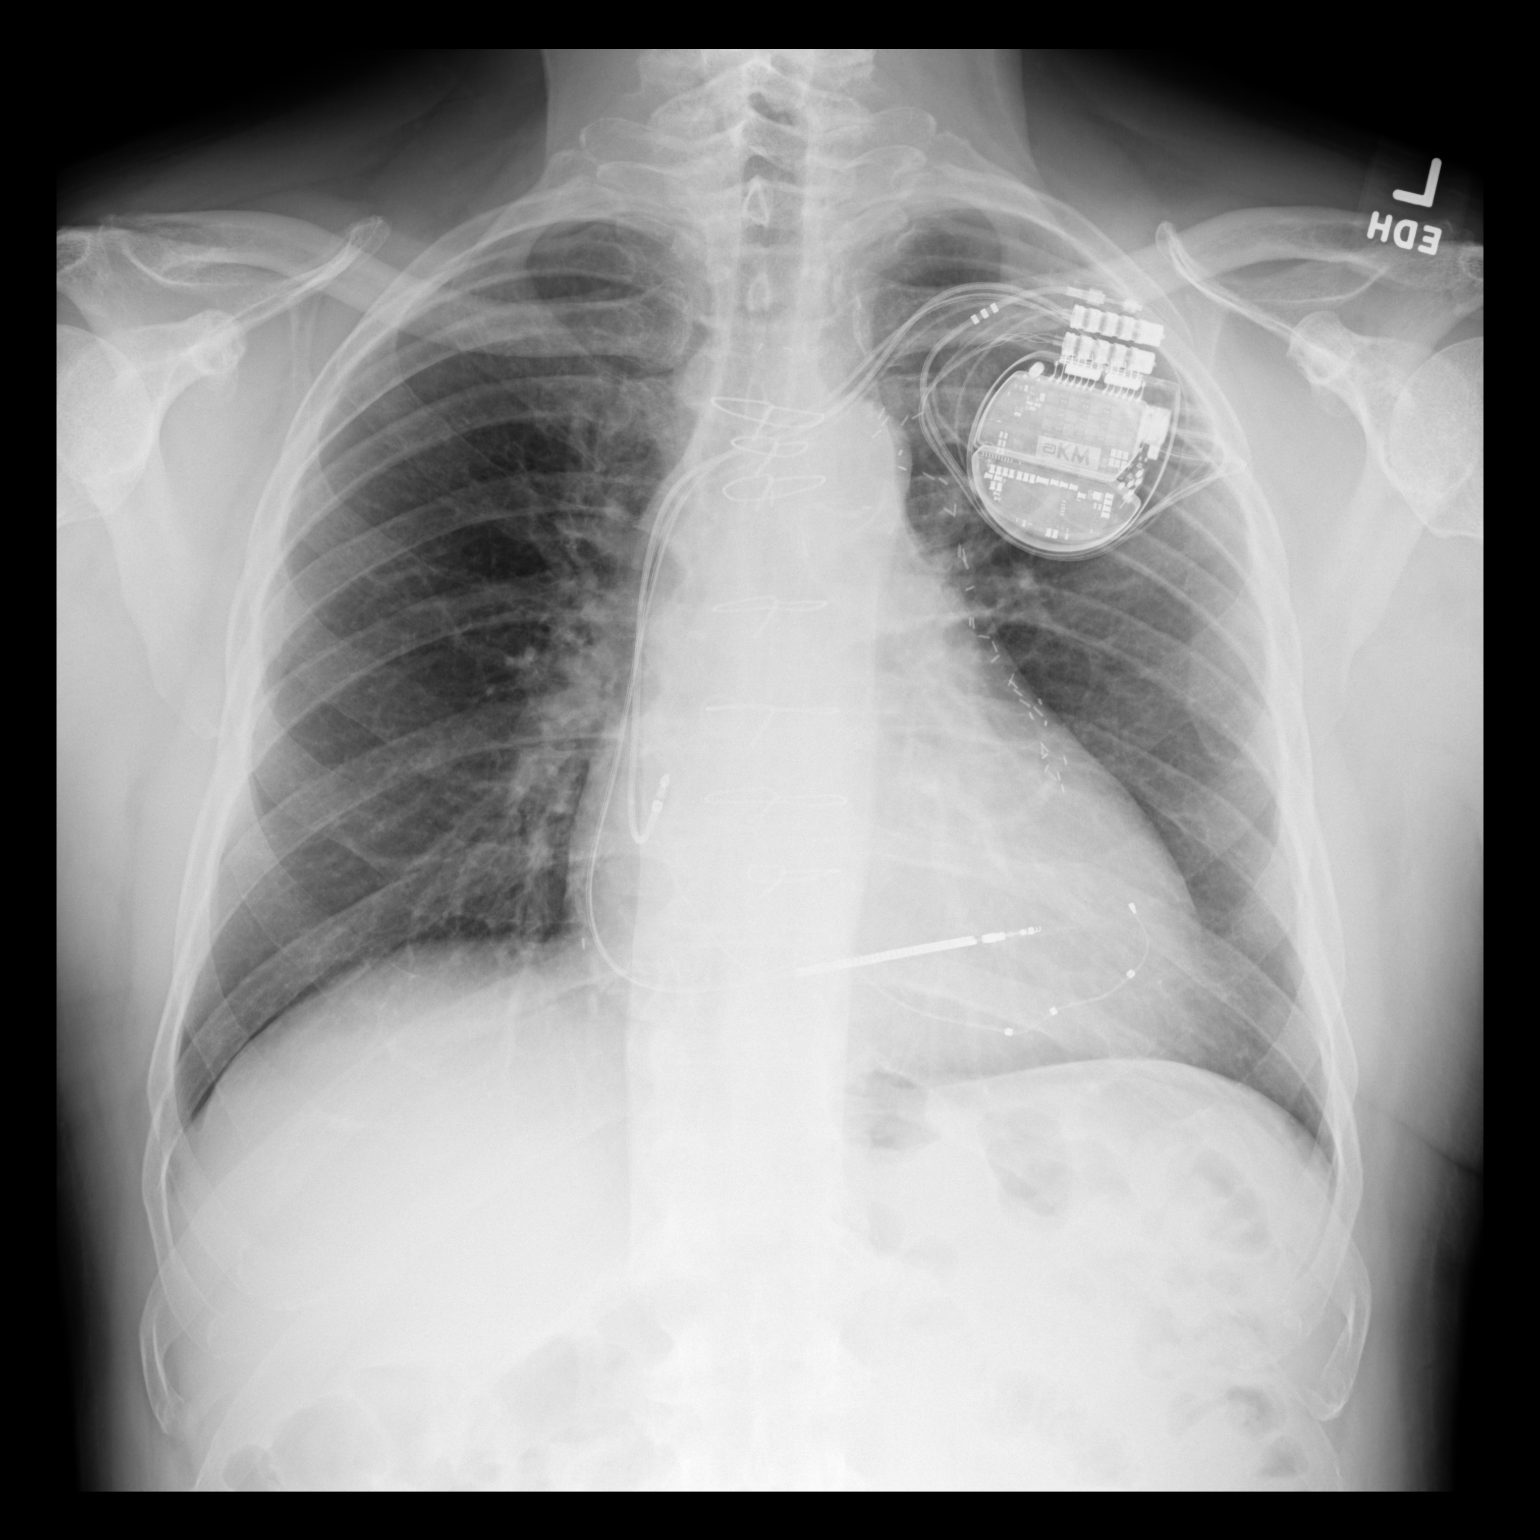

[dg chest 2 view (2 of 2)]
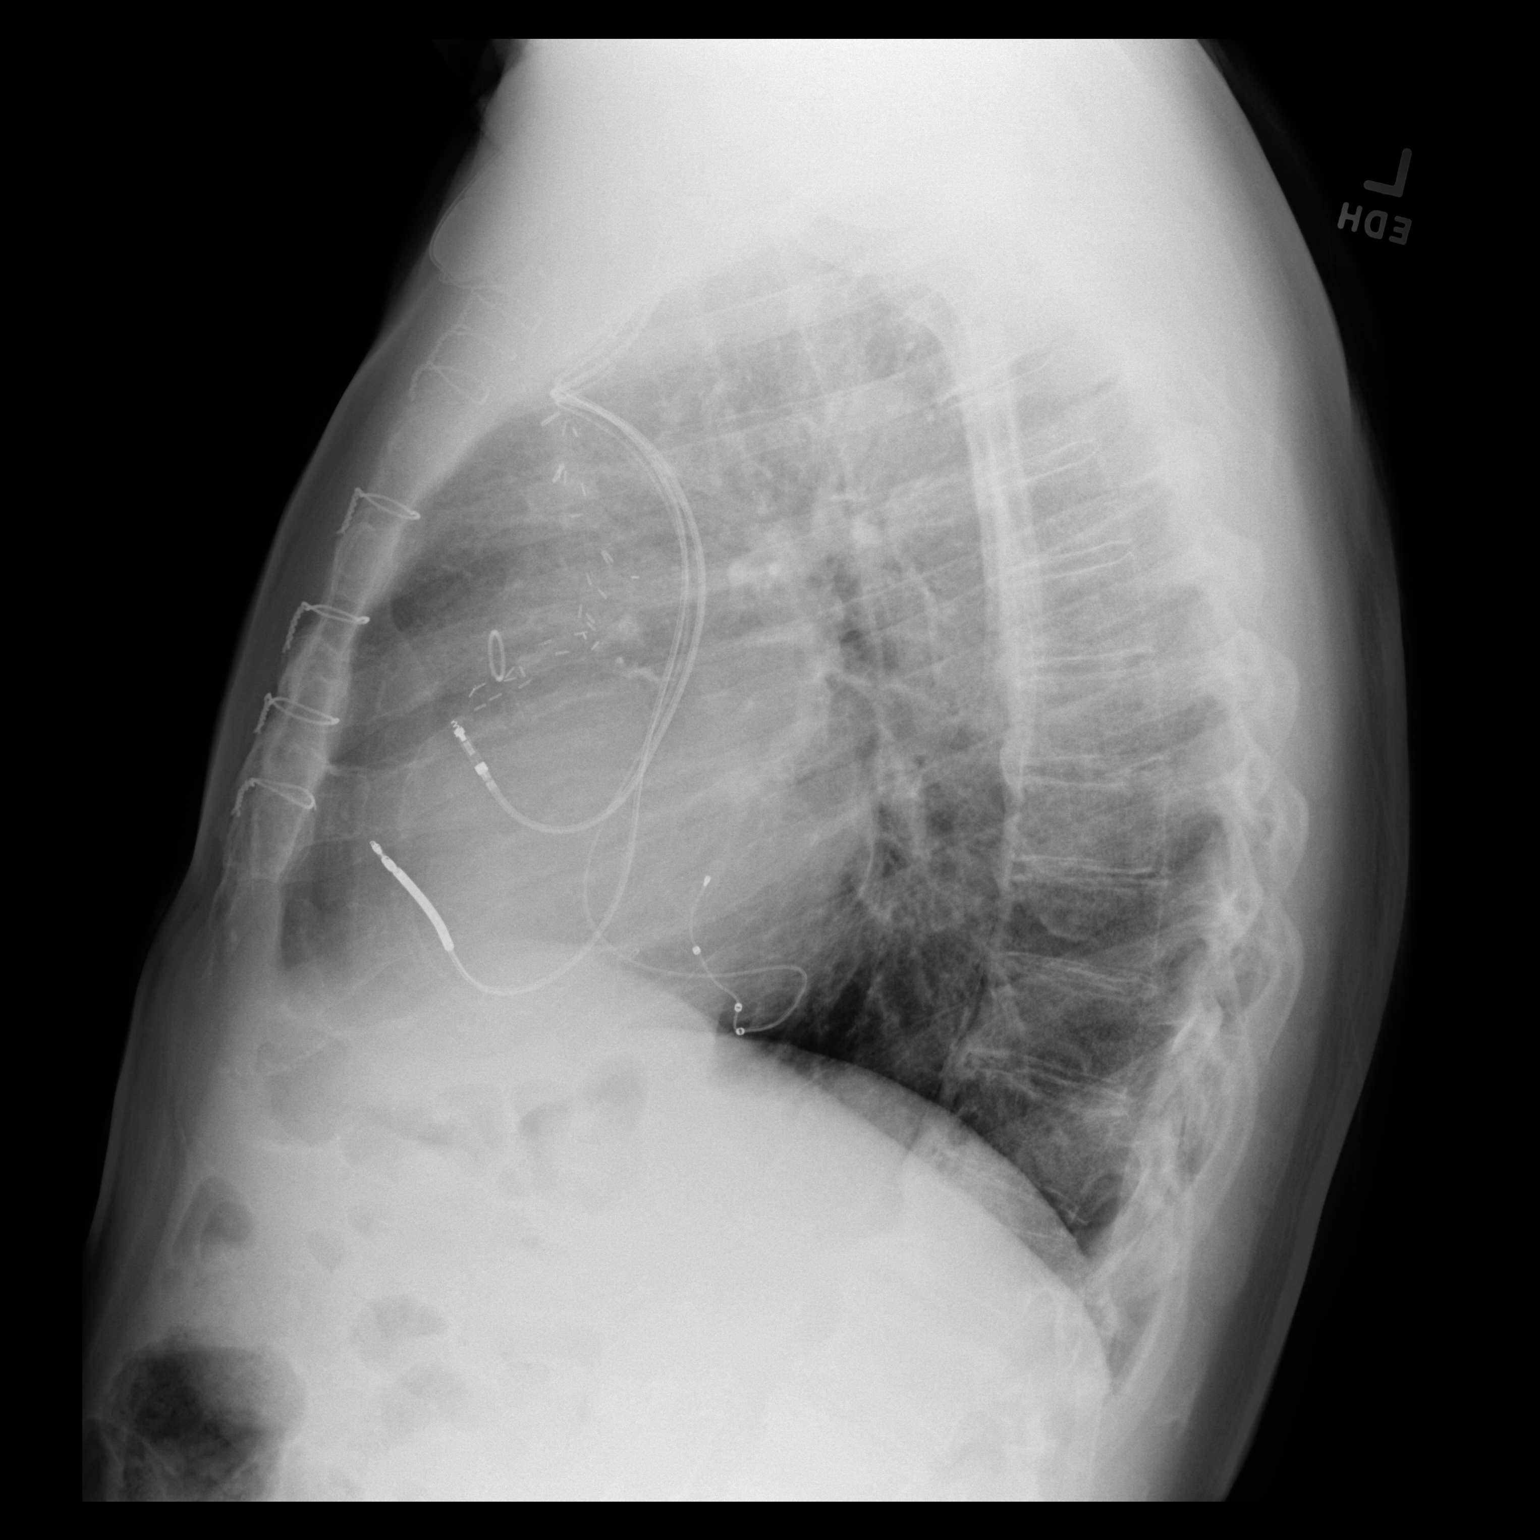

[2 of 2 positions shown; findings below may reference images not displayed]

FINDINGS: Previous median sternotomy. Pacemaker/AICD appears unchanged. Mild
cardiomegaly. The pulmonary vascularity is normal. The lungs are
clear. No effusions. No acute bone finding.
IMPRESSION: No active disease. Pacemaker/AICD unchanged. Mild cardiomegaly.

## 2021-03-01 ENCOUNTER — Ambulatory Visit (INDEPENDENT_AMBULATORY_CARE_PROVIDER_SITE_OTHER): Payer: Medicare Other

## 2021-03-01 DIAGNOSIS — I5022 Chronic systolic (congestive) heart failure: Secondary | ICD-10-CM

## 2021-03-01 DIAGNOSIS — Z95810 Presence of automatic (implantable) cardiac defibrillator: Secondary | ICD-10-CM | POA: Diagnosis not present

## 2021-03-03 ENCOUNTER — Telehealth: Payer: Self-pay

## 2021-03-03 NOTE — Telephone Encounter (Signed)
Remote ICM transmission received.  Attempted call to patient regarding ICM remote transmission and left detailed message per DPR.  Advised to return call for any fluid symptoms or questions. Next ICM remote transmission scheduled 04/06/2021.

## 2021-03-03 NOTE — Progress Notes (Signed)
EPIC Encounter for ICM Monitoring  Patient Name: Brian Bruce is a 75 y.o. male Date: 03/03/2021 Primary Care Physican: Neale Burly, MD Electrophysiologist: Allred Bi-V Pacing: 98%            01/26/2021 Weight: 188 lbs    AT/AF Burden 0%                                                          Attempted call to patient and unable to reach.  Left detailed message per DPR regarding transmission. Transmission reviewed.    CorVue thoracic impedance normal fluid levels.   Prescribed: Torsemide 20 mg take 1 tablet daily.   Labs: 12/03/2020 Creatinine 0.67, BUN 7, Potassium 4.0, Sodium 136, GFR >60 A complete set of results can be found in Results Review.   Recommendations: Left voice mail with ICM number and encouraged to call if experiencing any fluid symptoms.   Follow-up plan: ICM clinic phone appointment on 04/06/2021.   91 day device clinic remote transmission 05/13/2021.     EP/Cardiology Office Visits:  06/04/2021 with Dr. Rayann Heman.     Copy of ICM check sent to Dr. Rayann Heman.     3 month ICM trend: 03/01/2021.    1 Year ICM trend:       Rosalene Billings, RN 03/03/2021 5:05 PM

## 2021-03-05 DIAGNOSIS — Z7901 Long term (current) use of anticoagulants: Secondary | ICD-10-CM | POA: Diagnosis not present

## 2021-03-06 NOTE — Progress Notes (Signed)
Remote ICD transmission.   

## 2021-03-09 DIAGNOSIS — I5022 Chronic systolic (congestive) heart failure: Secondary | ICD-10-CM | POA: Diagnosis not present

## 2021-03-09 DIAGNOSIS — I2510 Atherosclerotic heart disease of native coronary artery without angina pectoris: Secondary | ICD-10-CM | POA: Diagnosis not present

## 2021-03-09 DIAGNOSIS — I4811 Longstanding persistent atrial fibrillation: Secondary | ICD-10-CM | POA: Diagnosis not present

## 2021-03-09 DIAGNOSIS — I10 Essential (primary) hypertension: Secondary | ICD-10-CM | POA: Diagnosis not present

## 2021-03-10 DIAGNOSIS — U071 COVID-19: Secondary | ICD-10-CM | POA: Diagnosis not present

## 2021-04-08 ENCOUNTER — Telehealth: Payer: Self-pay

## 2021-04-08 NOTE — Telephone Encounter (Signed)
I spoke with the patient about missed ICM transmission and he states he is out of town. He agreed to send the transmission tomorrow.

## 2021-04-09 DIAGNOSIS — Z7901 Long term (current) use of anticoagulants: Secondary | ICD-10-CM | POA: Diagnosis not present

## 2021-04-09 NOTE — Progress Notes (Signed)
No ICM remote transmission received for 04/06/2021 and next ICM transmission scheduled for 04/19/2021.

## 2021-04-13 ENCOUNTER — Telehealth: Payer: Self-pay

## 2021-04-13 ENCOUNTER — Ambulatory Visit (INDEPENDENT_AMBULATORY_CARE_PROVIDER_SITE_OTHER): Payer: Medicare Other

## 2021-04-13 DIAGNOSIS — Z95810 Presence of automatic (implantable) cardiac defibrillator: Secondary | ICD-10-CM

## 2021-04-13 DIAGNOSIS — I5022 Chronic systolic (congestive) heart failure: Secondary | ICD-10-CM

## 2021-04-13 NOTE — Telephone Encounter (Signed)
Remote ICM transmission received.  Attempted call to patient regarding ICM remote transmission and left detailed message per DPR.  Advised to return call for any fluid symptoms or questions. Next ICM remote transmission scheduled 05/14/2021.

## 2021-04-13 NOTE — Telephone Encounter (Signed)
Spoke with patient.  He has attempted to send remote transmission several times using the [place] but has not been successful.  Attempted again while on the phone but showed Internet connection problem.  [place] site shows no communication with monitor for >30 days.  Advised to call [employer] and provided number.  He will send a report once he gets assistance from [employer].

## 2021-04-13 NOTE — Progress Notes (Signed)
EPIC Encounter for ICM Monitoring  Patient Name: Brian Bruce is a 75 y.o. male Date: 04/13/2021 Primary Care Physican: Neale Burly, MD Electrophysiologist: Allred Bi-V Pacing: 98%            01/26/2021 Weight: 188 lbs    AT/AF Burden 0%                                                          Attempted call to patient and unable to reach.  Left detailed message per DPR regarding transmission. Transmission reviewed.    CorVue thoracic impedance normal fluid levels.   Prescribed: Torsemide 20 mg take 1 tablet daily.   Labs: 12/03/2020 Creatinine 0.67, BUN 7, Potassium 4.0, Sodium 136, GFR >60 A complete set of results can be found in Results Review.   Recommendations: Left voice mail with ICM number and encouraged to call if experiencing any fluid symptoms.   Follow-up plan: ICM clinic phone appointment on 05/14/2021.   91 day device clinic remote transmission 05/13/2021.     EP/Cardiology Office Visits:  06/04/2021 with Dr. Rayann Heman.     Copy of ICM check sent to Dr. Rayann Heman.      3 month ICM trend: 04/13/2021.    1 Year ICM trend:       Rosalene Billings, RN 04/13/2021 1:14 PM

## 2021-04-20 DIAGNOSIS — E1159 Type 2 diabetes mellitus with other circulatory complications: Secondary | ICD-10-CM | POA: Diagnosis not present

## 2021-04-20 DIAGNOSIS — I5022 Chronic systolic (congestive) heart failure: Secondary | ICD-10-CM | POA: Diagnosis not present

## 2021-04-20 DIAGNOSIS — E7849 Other hyperlipidemia: Secondary | ICD-10-CM | POA: Diagnosis not present

## 2021-04-20 DIAGNOSIS — I10 Essential (primary) hypertension: Secondary | ICD-10-CM | POA: Diagnosis not present

## 2021-05-06 ENCOUNTER — Other Ambulatory Visit: Payer: Self-pay | Admitting: Internal Medicine

## 2021-05-07 DIAGNOSIS — Z7901 Long term (current) use of anticoagulants: Secondary | ICD-10-CM | POA: Diagnosis not present

## 2021-05-13 ENCOUNTER — Ambulatory Visit (INDEPENDENT_AMBULATORY_CARE_PROVIDER_SITE_OTHER): Payer: Medicare Other

## 2021-05-13 DIAGNOSIS — I428 Other cardiomyopathies: Secondary | ICD-10-CM

## 2021-05-14 ENCOUNTER — Ambulatory Visit (INDEPENDENT_AMBULATORY_CARE_PROVIDER_SITE_OTHER): Payer: Medicare Other

## 2021-05-14 DIAGNOSIS — Z95810 Presence of automatic (implantable) cardiac defibrillator: Secondary | ICD-10-CM | POA: Diagnosis not present

## 2021-05-14 DIAGNOSIS — I5022 Chronic systolic (congestive) heart failure: Secondary | ICD-10-CM | POA: Diagnosis not present

## 2021-05-14 LAB — CUP PACEART REMOTE DEVICE CHECK
Battery Remaining Longevity: 62 mo
Battery Remaining Percentage: 71 %
Battery Voltage: 2.96 V
Brady Statistic AP VP Percent: 91 %
Brady Statistic AP VS Percent: 1 %
Brady Statistic AS VP Percent: 6.8 %
Brady Statistic AS VS Percent: 1.5 %
Brady Statistic RA Percent Paced: 92 %
Date Time Interrogation Session: 20221014050738
HighPow Impedance: 63 Ohm
Implantable Lead Implant Date: 20201015
Implantable Lead Implant Date: 20201015
Implantable Lead Implant Date: 20201015
Implantable Lead Location: 753858
Implantable Lead Location: 753859
Implantable Lead Location: 753860
Implantable Pulse Generator Implant Date: 20201015
Lead Channel Impedance Value: 400 Ohm
Lead Channel Impedance Value: 400 Ohm
Lead Channel Impedance Value: 500 Ohm
Lead Channel Pacing Threshold Amplitude: 0.625 V
Lead Channel Pacing Threshold Amplitude: 1 V
Lead Channel Pacing Threshold Amplitude: 1.75 V
Lead Channel Pacing Threshold Pulse Width: 0.5 ms
Lead Channel Pacing Threshold Pulse Width: 0.5 ms
Lead Channel Pacing Threshold Pulse Width: 0.8 ms
Lead Channel Sensing Intrinsic Amplitude: 12 mV
Lead Channel Sensing Intrinsic Amplitude: 2.9 mV
Lead Channel Setting Pacing Amplitude: 1.625
Lead Channel Setting Pacing Amplitude: 2 V
Lead Channel Setting Pacing Amplitude: 2.25 V
Lead Channel Setting Pacing Pulse Width: 0.5 ms
Lead Channel Setting Pacing Pulse Width: 0.8 ms
Lead Channel Setting Sensing Sensitivity: 0.5 mV
Pulse Gen Serial Number: 111006847

## 2021-05-14 NOTE — Progress Notes (Signed)
EPIC Encounter for ICM Monitoring  Patient Name: BRENNAN LITZINGER is a 75 y.o. male Date: 05/14/2021 Primary Care Physican: Neale Burly, MD Electrophysiologist: Allred Bi-V Pacing: 98%            05/14/2021 Weight: 188 lbs    AT/AF Burden 0%                                                          Spoke with patient and heart failure questions reviewed.  Pt asymptomatic for fluid accumulation and feeling well.  [place] past week which correlates with decreased impedance.   CorVue thoracic impedance normal fluid levels.   Prescribed: Torsemide 20 mg take 1 tablet daily.   Labs: 12/03/2020 Creatinine 0.67, BUN 7, Potassium 4.0, Sodium 136, GFR >60 A complete set of results can be found in Results Review.   Recommendations:  No changes and encouraged to call if experiencing any fluid symptoms.   Follow-up plan: ICM clinic phone appointment on 06/14/2021.   91 day device clinic remote transmission 08/12/2021.     EP/Cardiology Office Visits:  06/04/2021 with Dr. Rayann Heman.     Copy of ICM check sent to Dr. Rayann Heman.     3 month ICM trend: 05/14/2021.    1 Year ICM trend:       Rosalene Billings, RN 05/14/2021 4:36 PM

## 2021-05-17 DIAGNOSIS — Z20828 Contact with and (suspected) exposure to other viral communicable diseases: Secondary | ICD-10-CM | POA: Diagnosis not present

## 2021-05-17 DIAGNOSIS — Z23 Encounter for immunization: Secondary | ICD-10-CM | POA: Diagnosis not present

## 2021-05-21 NOTE — Progress Notes (Signed)
Remote ICD transmission.   

## 2021-06-03 ENCOUNTER — Other Ambulatory Visit (HOSPITAL_COMMUNITY): Payer: Self-pay | Admitting: Internal Medicine

## 2021-06-04 ENCOUNTER — Ambulatory Visit (INDEPENDENT_AMBULATORY_CARE_PROVIDER_SITE_OTHER): Payer: Medicare Other | Admitting: Internal Medicine

## 2021-06-04 VITALS — BP 178/66 | HR 61 | Ht 70.0 in | Wt 196.2 lb

## 2021-06-04 DIAGNOSIS — I255 Ischemic cardiomyopathy: Secondary | ICD-10-CM | POA: Diagnosis not present

## 2021-06-04 DIAGNOSIS — D6869 Other thrombophilia: Secondary | ICD-10-CM

## 2021-06-04 DIAGNOSIS — I10 Essential (primary) hypertension: Secondary | ICD-10-CM | POA: Diagnosis not present

## 2021-06-04 DIAGNOSIS — I4819 Other persistent atrial fibrillation: Secondary | ICD-10-CM

## 2021-06-04 DIAGNOSIS — I5022 Chronic systolic (congestive) heart failure: Secondary | ICD-10-CM | POA: Diagnosis not present

## 2021-06-04 LAB — CUP PACEART INCLINIC DEVICE CHECK
Battery Remaining Longevity: 61 mo
Brady Statistic RA Percent Paced: 92 %
Brady Statistic RV Percent Paced: 98 %
Date Time Interrogation Session: 20221104084313
HighPow Impedance: 65.25 Ohm
Implantable Lead Implant Date: 20201015
Implantable Lead Implant Date: 20201015
Implantable Lead Implant Date: 20201015
Implantable Lead Location: 753858
Implantable Lead Location: 753859
Implantable Lead Location: 753860
Implantable Pulse Generator Implant Date: 20201015
Lead Channel Impedance Value: 437.5 Ohm
Lead Channel Impedance Value: 462.5 Ohm
Lead Channel Impedance Value: 537.5 Ohm
Lead Channel Pacing Threshold Amplitude: 0.625 V
Lead Channel Pacing Threshold Amplitude: 1 V
Lead Channel Pacing Threshold Amplitude: 1.375 V
Lead Channel Pacing Threshold Pulse Width: 0.5 ms
Lead Channel Pacing Threshold Pulse Width: 0.5 ms
Lead Channel Pacing Threshold Pulse Width: 0.8 ms
Lead Channel Sensing Intrinsic Amplitude: 12 mV
Lead Channel Sensing Intrinsic Amplitude: 3.4 mV
Lead Channel Setting Pacing Amplitude: 1.625
Lead Channel Setting Pacing Amplitude: 1.875
Lead Channel Setting Pacing Amplitude: 2 V
Lead Channel Setting Pacing Pulse Width: 0.5 ms
Lead Channel Setting Pacing Pulse Width: 0.8 ms
Lead Channel Setting Sensing Sensitivity: 0.5 mV
Pulse Gen Serial Number: 111006847

## 2021-06-04 NOTE — Patient Instructions (Signed)
Medication Instructions:  Continue all current medications.  Labwork: BMET, Digoxin level, Mg, CBC - orders given today.  Office will contact with results via phone or letter.     Testing/Procedures: none  Follow-Up: 6  months - afib clinic 1 year - Dr. Rayann Heman    Any Other Special Instructions Will Be Listed Below (If Applicable).   If you need a refill on your cardiac medications before your next appointment, please call your pharmacy.

## 2021-06-04 NOTE — Progress Notes (Signed)
PCP: Neale Burly, MD Primary Cardiologist: Dr Candis Musa Primary EP: Dr Lucious Groves is a 75 y.o. male who presents today for routine electrophysiology followup.  Since last being seen in our clinic, the patient reports doing very well.  Today, he denies symptoms of palpitations, chest pain, shortness of breath,  lower extremity edema, dizziness, presyncope, syncope, or ICD shocks.  The patient is otherwise without complaint today.   Past Medical History:  Diagnosis Date   Asthma    CHF (congestive heart failure) (HCC)    Coronary artery disease    Diabetes mellitus without complication (HCC)    Ischemic cardiomyopathy    LBBB (left bundle branch block)    Persistent atrial fibrillation [facility])    Past Surgical History:  Procedure Laterality Date   BIV ICD INSERTION CRT-D N/A 05/16/2019   Procedure: BIV ICD INSERTION CRT-D;  Surgeon: Thompson Grayer, MD;  Location: [place] [facility];  Service: Cardiovascular;  Laterality: N/A;   CORONARY ARTERY BYPASS GRAFT  1998   4-vessel   RIGHT/LEFT HEART CATH AND CORONARY/GRAFT ANGIOGRAPHY N/A 03/07/2019   Procedure: RIGHT/LEFT HEART CATH AND CORONARY/GRAFT ANGIOGRAPHY;  Surgeon: Nelva Bush, MD;  Location: [place] [facility];  Service: Cardiovascular;  Laterality: N/A;    ROS- all systems are reviewed and negative except as per HPI above  Current Outpatient Medications  Medication Sig Dispense Refill   acetaminophen (TYLENOL) 500 MG tablet Take 500 mg by mouth every 6 (six) hours as needed for moderate pain.     aspirin EC 81 MG tablet Take 81 mg by mouth daily.     atorvastatin (LIPITOR) 80 MG tablet Take 80 mg by mouth every evening.     augmented betamethasone dipropionate (DIPROLENE-AF) 0.05 % cream Apply 1 application topically 2 (two) times daily as needed (rash).     carvedilol (COREG) 25 MG tablet Take 1 tablet by mouth 2 (two) times daily.     Cholecalciferol (VITAMIN D) 50 MCG (2000 UT) tablet Take 2,000 Units by mouth  daily.     Coenzyme Q10 (CO Q-10) 100 MG CAPS Take 100 mg by mouth daily.     digoxin (LANOXIN) 0.125 MG tablet Take 1 tablet by mouth once daily 90 tablet 3   dofetilide (TIKOSYN) 500 MCG capsule Take 1 capsule by mouth twice daily 60 capsule 9   glimepiride (AMARYL) 2 MG tablet Take 1 tablet by mouth daily.     GLUCOSAMINE-CHONDROITIN PO Take 1 tablet by mouth 2 (two) times daily.     hydrALAZINE (APRESOLINE) 25 MG tablet Take by mouth 3 (three) times daily.      ketotifen (ZADITOR) 0.025 % ophthalmic solution Place 1 drop into both eyes daily.     magnesium oxide (MAG-OX) 400 (240 Mg) MG tablet Take 1 tablet by mouth twice daily 180 tablet 1   metFORMIN (GLUCOPHAGE) 1000 MG tablet Take 1 tablet (1,000 mg total) by mouth 2 (two) times daily.     olmesartan (BENICAR) 20 MG tablet      torsemide (DEMADEX) 20 MG tablet Take 1 tablet by mouth once daily 90 tablet 0   warfarin (COUMADIN) 5 MG tablet Take 7.5 mg by mouth daily. except Thursday 10 mg     amLODipine (NORVASC) 10 MG tablet Take 1 tablet by mouth daily.     No current facility-administered medications for this visit.    Physical Exam: Vitals:   06/04/21 0832  BP: (!) 178/66  Pulse: 61  SpO2: 97%  Weight: 196 lb 3.2 oz (89 kg)  Height: 5\' 10"  (1.778 m)    GEN- The patient is well appearing, alert and oriented x 3 today.   Head- normocephalic, atraumatic Eyes-  Sclera clear, conjunctiva pink Ears- hearing intact Oropharynx- clear Lungs- Clear to ausculation bilaterally, normal work of breathing Chest- ICD pocket is well healed Heart- Regular rate and rhythm, no murmurs, rubs or gallops, PMI not laterally displaced GI- soft, NT, ND, + BS Extremities- no clubbing, cyanosis, or edema  ICD interrogation- reviewed in detail today,  See PACEART report  ekg tracing ordered today is personally reviewed and shows AV paced  Wt Readings from Last 3 Encounters:  06/04/21 196 lb 3.2 oz (89 kg)  12/03/20 189 lb 12.8 oz (86.1 kg)   06/05/20 167 lb (75.8 kg)    Assessment and Plan:  1.  Chronic systolic dysfunction/ CAD/ ischemic CM s/p CABG 1998 euvolemic today Stable on an appropriate medical regimen Normal ICD function with good response to CRT See Pace Art report No changes today he is not device dependant today followed in ICM device clinic I have reviewed Dr Pryor Montes last note  2. Persistent atrial fibrillation Maintaining sinus rhythm with tikosyn AF burden is 0 % We discussed the importance of close folloow-up while on tikosyn to avoid toxicity with this medicine Chads2vasc score is 4.  Continue coumadin Labs 12/03/20 reviewed I will obtain dig level, bmet, mg, and cbc today  3. HTN Elevated today I have reviewed his home journal which reveals good control   Risks, benefits and potential toxicities for medications prescribed and/or refilled reviewed with patient today.   Thompson Grayer MD, [facility] 06/04/2021 8:44 AM

## 2021-06-07 DIAGNOSIS — I4819 Other persistent atrial fibrillation: Secondary | ICD-10-CM | POA: Diagnosis not present

## 2021-06-07 DIAGNOSIS — I255 Ischemic cardiomyopathy: Secondary | ICD-10-CM | POA: Diagnosis not present

## 2021-06-08 ENCOUNTER — Other Ambulatory Visit (HOSPITAL_COMMUNITY): Payer: Self-pay | Admitting: Internal Medicine

## 2021-06-09 ENCOUNTER — Telehealth: Payer: Self-pay | Admitting: Internal Medicine

## 2021-06-09 MED ORDER — MAGNESIUM OXIDE -MG SUPPLEMENT 400 (240 MG) MG PO TABS: 1.0000 | ORAL_TABLET | Freq: Two times a day (BID) | ORAL | 3 refills | Status: DC

## 2021-06-09 NOTE — Telephone Encounter (Signed)
*  STAT* If patient is at the pharmacy, call can be transferred to refill team.   1. Which medications need to be refilled? (please list name of each medication and dose if known) magnesium oxide (MAG-OX) 400 (240 Mg) MG tablet  2. Which pharmacy/location (including street and city if local pharmacy) is medication to be sent to? [place], [place]  3. Do they need a 30 day or 90 day supply? [place]

## 2021-06-11 DIAGNOSIS — Z7901 Long term (current) use of anticoagulants: Secondary | ICD-10-CM | POA: Diagnosis not present

## 2021-06-14 ENCOUNTER — Ambulatory Visit (INDEPENDENT_AMBULATORY_CARE_PROVIDER_SITE_OTHER): Payer: Medicare Other

## 2021-06-14 DIAGNOSIS — I5022 Chronic systolic (congestive) heart failure: Secondary | ICD-10-CM | POA: Diagnosis not present

## 2021-06-14 DIAGNOSIS — Z95810 Presence of automatic (implantable) cardiac defibrillator: Secondary | ICD-10-CM

## 2021-06-15 NOTE — Progress Notes (Signed)
EPIC Encounter for ICM Monitoring  Patient Name: Brian Bruce is a 75 y.o. male Date: 06/15/2021 Primary Care Physican: Neale Burly, MD Electrophysiologist: Allred Bi-V Pacing: 98%            05/14/2021 Weight: 188 lbs    AT/AF Burden 0%                                                          Spoke with patient and heart failure questions reviewed.  Pt asymptomatic for fluid accumulation and feeling well.     CorVue thoracic impedance normal fluid levels.   Prescribed: Torsemide 20 mg take 1 tablet daily.   Labs: 12/03/2020 Creatinine 0.67, BUN 7, Potassium 4.0, Sodium 136, GFR >60 A complete set of results can be found in Results Review.   Recommendations:  No changes and encouraged to call if experiencing any fluid symptoms.   Follow-up plan: ICM clinic phone appointment on 07/19/2021.   91 day device clinic remote transmission 08/12/2021.     EP/Cardiology Office Visits:  06/03/2022 with Dr. Rayann Heman.     Copy of ICM check sent to Dr. Rayann Heman.     3 month ICM trend: 06/14/2021.    12-14 Month ICM trend:       Rosalene Billings, RN 06/15/2021 4:07 PM

## 2021-06-30 DIAGNOSIS — Z20828 Contact with and (suspected) exposure to other viral communicable diseases: Secondary | ICD-10-CM | POA: Diagnosis not present

## 2021-07-05 DIAGNOSIS — E083293 Diabetes mellitus due to underlying condition with mild nonproliferative diabetic retinopathy without macular edema, bilateral: Secondary | ICD-10-CM | POA: Diagnosis not present

## 2021-07-19 ENCOUNTER — Ambulatory Visit (INDEPENDENT_AMBULATORY_CARE_PROVIDER_SITE_OTHER): Payer: Medicare Other

## 2021-07-19 DIAGNOSIS — Z95810 Presence of automatic (implantable) cardiac defibrillator: Secondary | ICD-10-CM

## 2021-07-19 DIAGNOSIS — I5022 Chronic systolic (congestive) heart failure: Secondary | ICD-10-CM | POA: Diagnosis not present

## 2021-07-20 ENCOUNTER — Telehealth: Payer: Self-pay

## 2021-07-20 NOTE — Telephone Encounter (Signed)
Remote ICM transmission received.  Attempted call to patient regarding ICM remote transmission and left detailed message per DPR.  Advised to return call for any fluid symptoms or questions. Next ICM remote transmission scheduled 08/23/2021.   ° °

## 2021-07-20 NOTE — Progress Notes (Signed)
EPIC Encounter for ICM Monitoring  Patient Name: Brian Bruce is a 75 y.o. male Date: 07/20/2021 Primary Care Physican: Neale Burly, MD Electrophysiologist: Allred Bi-V Pacing: 98%            05/14/2021 Weight: 188 lbs    AT/AF Burden 0%                                                          Attempted call to patient and unable to reach.  Left detailed message per DPR regarding transmission. Transmission reviewed.    CorVue thoracic impedance normal fluid levels.   Prescribed: Torsemide 20 mg take 1 tablet daily.   Labs: 12/03/2020 Creatinine 0.67, BUN 7, Potassium 4.0, Sodium 136, GFR >60 A complete set of results can be found in Results Review.   Recommendations:  Left voice mail with ICM number and encouraged to call if experiencing any fluid symptoms.   Follow-up plan: ICM clinic phone appointment on 08/23/2021.   91 day device clinic remote transmission 08/12/2021.     EP/Cardiology Office Visits:  06/03/2022 with Dr. Rayann Heman.     Copy of ICM check sent to Dr. Rayann Heman.     3 month ICM trend: 07/19/2021.    12-14 Month ICM trend:       Rosalene Billings, RN 07/20/2021 1:25 PM

## 2021-07-27 DIAGNOSIS — E1159 Type 2 diabetes mellitus with other circulatory complications: Secondary | ICD-10-CM | POA: Diagnosis not present

## 2021-07-27 DIAGNOSIS — Z0000 Encounter for general adult medical examination without abnormal findings: Secondary | ICD-10-CM | POA: Diagnosis not present

## 2021-07-27 DIAGNOSIS — Z7901 Long term (current) use of anticoagulants: Secondary | ICD-10-CM | POA: Diagnosis not present

## 2021-07-27 DIAGNOSIS — E7849 Other hyperlipidemia: Secondary | ICD-10-CM | POA: Diagnosis not present

## 2021-07-27 DIAGNOSIS — I5022 Chronic systolic (congestive) heart failure: Secondary | ICD-10-CM | POA: Diagnosis not present

## 2021-07-27 DIAGNOSIS — Z125 Encounter for screening for malignant neoplasm of prostate: Secondary | ICD-10-CM | POA: Diagnosis not present

## 2021-07-27 DIAGNOSIS — I10 Essential (primary) hypertension: Secondary | ICD-10-CM | POA: Diagnosis not present

## 2021-08-05 ENCOUNTER — Other Ambulatory Visit: Payer: Self-pay | Admitting: Internal Medicine

## 2021-08-12 ENCOUNTER — Ambulatory Visit (INDEPENDENT_AMBULATORY_CARE_PROVIDER_SITE_OTHER): Payer: Medicare Other

## 2021-08-12 DIAGNOSIS — I255 Ischemic cardiomyopathy: Secondary | ICD-10-CM | POA: Diagnosis not present

## 2021-08-12 LAB — CUP PACEART REMOTE DEVICE CHECK
Battery Remaining Longevity: 59 mo
Battery Remaining Percentage: 68 %
Battery Voltage: 2.96 V
Brady Statistic AP VP Percent: 94 %
Brady Statistic AP VS Percent: 1 %
Brady Statistic AS VP Percent: 4.3 %
Brady Statistic AS VS Percent: 1.7 %
Brady Statistic RA Percent Paced: 94 %
Date Time Interrogation Session: 20230112020204
HighPow Impedance: 62 Ohm
Implantable Lead Implant Date: 20201015
Implantable Lead Implant Date: 20201015
Implantable Lead Implant Date: 20201015
Implantable Lead Location: 753858
Implantable Lead Location: 753859
Implantable Lead Location: 753860
Implantable Pulse Generator Implant Date: 20201015
Lead Channel Impedance Value: 430 Ohm
Lead Channel Impedance Value: 440 Ohm
Lead Channel Impedance Value: 490 Ohm
Lead Channel Pacing Threshold Amplitude: 0.75 V
Lead Channel Pacing Threshold Amplitude: 1 V
Lead Channel Pacing Threshold Amplitude: 1.75 V
Lead Channel Pacing Threshold Pulse Width: 0.5 ms
Lead Channel Pacing Threshold Pulse Width: 0.5 ms
Lead Channel Pacing Threshold Pulse Width: 0.8 ms
Lead Channel Sensing Intrinsic Amplitude: 12 mV
Lead Channel Sensing Intrinsic Amplitude: 2.7 mV
Lead Channel Setting Pacing Amplitude: 1.75 V
Lead Channel Setting Pacing Amplitude: 2 V
Lead Channel Setting Pacing Amplitude: 2.25 V
Lead Channel Setting Pacing Pulse Width: 0.5 ms
Lead Channel Setting Pacing Pulse Width: 0.8 ms
Lead Channel Setting Sensing Sensitivity: 0.5 mV
Pulse Gen Serial Number: 111006847

## 2021-08-23 ENCOUNTER — Ambulatory Visit (INDEPENDENT_AMBULATORY_CARE_PROVIDER_SITE_OTHER): Payer: Medicare Other

## 2021-08-23 DIAGNOSIS — I5022 Chronic systolic (congestive) heart failure: Secondary | ICD-10-CM

## 2021-08-23 DIAGNOSIS — Z95810 Presence of automatic (implantable) cardiac defibrillator: Secondary | ICD-10-CM

## 2021-08-24 NOTE — Progress Notes (Signed)
Remote ICD transmission.   

## 2021-08-24 NOTE — Progress Notes (Signed)
EPIC Encounter for ICM Monitoring  Patient Name: Brian Bruce is a 76 y.o. male Date: 08/24/2021 Primary Care Physican: Neale Burly, MD [place] Cardiologist: Dr Candis Musa [facility] Electrophysiologist: Allred Bi-V Pacing: 98%            08/24/2021 Weight: 188 lbs    AT/AF Burden 0%                                                          Spoke with patient and heart failure questions reviewed.  Pt asymptomatic for fluid accumulation.  Reports feeling well at this time and voices no complaints.    CorVue thoracic impedance normal fluid levels.   Prescribed: Torsemide 20 mg take 1 tablet daily.   Labs: 12/03/2020 Creatinine 0.67, BUN 7, Potassium 4.0, Sodium 136, GFR >60 A complete set of results can be found in Results Review.   Recommendations:  No changes and encouraged to call if experiencing any fluid symptoms.   Follow-up plan: ICM clinic phone appointment on 09/27/2021.   91 day device clinic remote transmission 11/11/2021.     EP/Cardiology Office Visits:  06/03/2022 with Dr. Rayann Heman.     Copy of ICM check sent to Dr. Rayann Heman.    3 month ICM trend: 08/23/2021.    12-14 Month ICM trend:     Rosalene Billings, RN 08/24/2021 2:31 PM

## 2021-08-27 DIAGNOSIS — Z7901 Long term (current) use of anticoagulants: Secondary | ICD-10-CM | POA: Diagnosis not present

## 2021-09-06 ENCOUNTER — Other Ambulatory Visit: Payer: Self-pay | Admitting: Internal Medicine

## 2021-09-07 DIAGNOSIS — I2510 Atherosclerotic heart disease of native coronary artery without angina pectoris: Secondary | ICD-10-CM | POA: Diagnosis not present

## 2021-09-07 DIAGNOSIS — I4811 Longstanding persistent atrial fibrillation: Secondary | ICD-10-CM | POA: Diagnosis not present

## 2021-09-07 DIAGNOSIS — I5022 Chronic systolic (congestive) heart failure: Secondary | ICD-10-CM | POA: Diagnosis not present

## 2021-09-07 DIAGNOSIS — I10 Essential (primary) hypertension: Secondary | ICD-10-CM | POA: Diagnosis not present

## 2021-09-27 ENCOUNTER — Ambulatory Visit (INDEPENDENT_AMBULATORY_CARE_PROVIDER_SITE_OTHER): Payer: Medicare Other

## 2021-09-27 DIAGNOSIS — Z95810 Presence of automatic (implantable) cardiac defibrillator: Secondary | ICD-10-CM | POA: Diagnosis not present

## 2021-09-27 DIAGNOSIS — I5022 Chronic systolic (congestive) heart failure: Secondary | ICD-10-CM

## 2021-10-01 NOTE — Progress Notes (Signed)
EPIC Encounter for ICM Monitoring  Patient Name: Brian Bruce is a 76 y.o. male Date: 10/01/2021 Primary Care Physican: Neale Burly, MD Primary Cardiologist: Dr Candis Musa [facility] Electrophysiologist: Allred Bi-V Pacing: 98%            08/24/2021 Weight: 188 lbs    AT/AF Burden 0%                                                          Spoke with patient and heart failure questions reviewed.  Pt asymptomatic for fluid accumulation.  Reports feeling well at this time and voices no complaints.    CorVue thoracic impedance normal fluid levels.   Prescribed: Torsemide 20 mg take 1 tablet daily.   Labs: 12/03/2020 Creatinine 0.67, BUN 7, Potassium 4.0, Sodium 136, GFR >60 A complete set of results can be found in Results Review.   Recommendations:  No changes and encouraged to call if experiencing any fluid symptoms.   Follow-up plan: ICM clinic phone appointment on 11/01/2021.   91 day device clinic remote transmission 11/11/2021.     EP/Cardiology Office Visits:  06/03/2022 with Dr. Rayann Heman.     Copy of ICM check sent to Dr. Rayann Heman.    3 month ICM trend: 09/27/2021.    12-14 Month ICM trend:     Rosalene Billings, RN 10/01/2021 12:21 PM

## 2021-10-03 ENCOUNTER — Other Ambulatory Visit: Payer: Self-pay | Admitting: Internal Medicine

## 2021-10-11 DIAGNOSIS — D2239 Melanocytic nevi of other parts of face: Secondary | ICD-10-CM | POA: Diagnosis not present

## 2021-10-11 DIAGNOSIS — L820 Inflamed seborrheic keratosis: Secondary | ICD-10-CM | POA: Diagnosis not present

## 2021-10-11 DIAGNOSIS — C4441 Basal cell carcinoma of skin of scalp and neck: Secondary | ICD-10-CM | POA: Diagnosis not present

## 2021-10-11 DIAGNOSIS — L905 Scar conditions and fibrosis of skin: Secondary | ICD-10-CM | POA: Diagnosis not present

## 2021-10-11 DIAGNOSIS — L821 Other seborrheic keratosis: Secondary | ICD-10-CM | POA: Diagnosis not present

## 2021-10-11 DIAGNOSIS — L308 Other specified dermatitis: Secondary | ICD-10-CM | POA: Diagnosis not present

## 2021-10-25 DIAGNOSIS — Z7901 Long term (current) use of anticoagulants: Secondary | ICD-10-CM | POA: Diagnosis not present

## 2021-10-25 DIAGNOSIS — E7849 Other hyperlipidemia: Secondary | ICD-10-CM | POA: Diagnosis not present

## 2021-10-25 DIAGNOSIS — E1159 Type 2 diabetes mellitus with other circulatory complications: Secondary | ICD-10-CM | POA: Diagnosis not present

## 2021-10-25 DIAGNOSIS — I10 Essential (primary) hypertension: Secondary | ICD-10-CM | POA: Diagnosis not present

## 2021-10-25 DIAGNOSIS — I5022 Chronic systolic (congestive) heart failure: Secondary | ICD-10-CM | POA: Diagnosis not present

## 2021-11-01 ENCOUNTER — Ambulatory Visit (INDEPENDENT_AMBULATORY_CARE_PROVIDER_SITE_OTHER): Payer: Medicare Other

## 2021-11-01 DIAGNOSIS — I5022 Chronic systolic (congestive) heart failure: Secondary | ICD-10-CM | POA: Diagnosis not present

## 2021-11-01 DIAGNOSIS — Z95810 Presence of automatic (implantable) cardiac defibrillator: Secondary | ICD-10-CM

## 2021-11-03 NOTE — Progress Notes (Signed)
EPIC Encounter for ICM Monitoring ? ?Patient Name: Brian Bruce is a 76 y.o. male ?Date: 11/03/2021 ?Primary Care Physican: Neale Burly, MD ?Electrophysiologist: Allred ?Bi-V Pacing: 98%            ?08/24/2021 Weight: 188 lbs  ?11/03/2021 Weight: 188 lbs ?  ?AT/AF Burden 0% ?                                                         ?Spoke with patient and heart failure questions reviewed.  Pt asymptomatic for fluid accumulation.  Reports feeling well at this time and voices no complaints.  ?  ?CorVue thoracic impedance normal fluid levels. ?  ?Prescribed: Torsemide 20 mg take 1 tablet daily. ?  ?Labs: ?12/03/2020 Creatinine 0.67, BUN 7, Potassium 4.0, Sodium 136, GFR >60 ?A complete set of results can be found in Results Review. ?  ?Recommendations:  No changes and encouraged to call if experiencing any fluid symptoms. ?  ?Follow-up plan: ICM clinic phone appointment on 12/06/2021.   91 day device clinic remote transmission 02/10/2022.   ?  ?EP/Cardiology Office Visits:  06/03/2022 with Dr. Rayann Heman.   ?  ?Copy of ICM check sent to Dr. Rayann Heman.   ? ?3 month ICM trend: 11/01/2021. ? ? ? ?12-14 Month ICM trend:  ? ? ? ?Rosalene Billings, RN ?11/03/2021 ?12:24 PM ? ?

## 2021-11-11 ENCOUNTER — Ambulatory Visit (INDEPENDENT_AMBULATORY_CARE_PROVIDER_SITE_OTHER): Payer: Medicare Other

## 2021-11-11 DIAGNOSIS — I5022 Chronic systolic (congestive) heart failure: Secondary | ICD-10-CM | POA: Diagnosis not present

## 2021-11-11 LAB — CUP PACEART REMOTE DEVICE CHECK
Battery Remaining Longevity: 56 mo
Battery Remaining Percentage: 65 %
Battery Voltage: 2.95 V
Brady Statistic AP VP Percent: 94 %
Brady Statistic AP VS Percent: 1 %
Brady Statistic AS VP Percent: 4 %
Brady Statistic AS VS Percent: 1.6 %
Brady Statistic RA Percent Paced: 94 %
Date Time Interrogation Session: 20230413020006
HighPow Impedance: 60 Ohm
Implantable Lead Implant Date: 20201015
Implantable Lead Implant Date: 20201015
Implantable Lead Implant Date: 20201015
Implantable Lead Location: 753858
Implantable Lead Location: 753859
Implantable Lead Location: 753860
Implantable Pulse Generator Implant Date: 20201015
Lead Channel Impedance Value: 390 Ohm
Lead Channel Impedance Value: 400 Ohm
Lead Channel Impedance Value: 500 Ohm
Lead Channel Pacing Threshold Amplitude: 0.75 V
Lead Channel Pacing Threshold Amplitude: 1 V
Lead Channel Pacing Threshold Amplitude: 1.75 V
Lead Channel Pacing Threshold Pulse Width: 0.5 ms
Lead Channel Pacing Threshold Pulse Width: 0.5 ms
Lead Channel Pacing Threshold Pulse Width: 0.8 ms
Lead Channel Sensing Intrinsic Amplitude: 12 mV
Lead Channel Sensing Intrinsic Amplitude: 2.6 mV
Lead Channel Setting Pacing Amplitude: 1.75 V
Lead Channel Setting Pacing Amplitude: 2 V
Lead Channel Setting Pacing Amplitude: 2.25 V
Lead Channel Setting Pacing Pulse Width: 0.5 ms
Lead Channel Setting Pacing Pulse Width: 0.8 ms
Lead Channel Setting Sensing Sensitivity: 0.5 mV
Pulse Gen Serial Number: 111006847

## 2021-11-18 ENCOUNTER — Other Ambulatory Visit: Payer: Self-pay | Admitting: Internal Medicine

## 2021-11-22 DIAGNOSIS — Z08 Encounter for follow-up examination after completed treatment for malignant neoplasm: Secondary | ICD-10-CM | POA: Diagnosis not present

## 2021-11-22 DIAGNOSIS — Z85828 Personal history of other malignant neoplasm of skin: Secondary | ICD-10-CM | POA: Diagnosis not present

## 2021-11-22 DIAGNOSIS — L820 Inflamed seborrheic keratosis: Secondary | ICD-10-CM | POA: Diagnosis not present

## 2021-11-22 DIAGNOSIS — C44311 Basal cell carcinoma of skin of nose: Secondary | ICD-10-CM | POA: Diagnosis not present

## 2021-11-26 DIAGNOSIS — Z7901 Long term (current) use of anticoagulants: Secondary | ICD-10-CM | POA: Diagnosis not present

## 2021-11-29 NOTE — Progress Notes (Signed)
Remote ICD transmission.   

## 2021-12-06 ENCOUNTER — Ambulatory Visit (INDEPENDENT_AMBULATORY_CARE_PROVIDER_SITE_OTHER): Payer: Medicare Other

## 2021-12-06 DIAGNOSIS — I5022 Chronic systolic (congestive) heart failure: Secondary | ICD-10-CM

## 2021-12-06 DIAGNOSIS — Z95810 Presence of automatic (implantable) cardiac defibrillator: Secondary | ICD-10-CM

## 2021-12-06 NOTE — Progress Notes (Signed)
EPIC Encounter for ICM Monitoring ? ?Patient Name: Brian Bruce is a 76 y.o. male ?Date: 12/06/2021 ?Primary Care Physican: Neale Burly, MD ?Primary Cardiologist: [facility] ?Electrophysiologist: Allred ?Bi-V Pacing: 98%            ?08/24/2021 Weight: 188 lbs  ?11/03/2021 Weight: 188 lbs ?  ?AT/AF Burden 0% ?                                                         ?Attempted call to patient and unable to reach.  Left detailed message per DPR regarding transmission. Transmission reviewed.  ?  ?CorVue thoracic impedance possible fluid accumulation starting 5/7.  Impedance also suggesting possible fluid accumulation from 4/19-4/26 & 5/1-5/4. ?  ?Prescribed: Torsemide 20 mg take 1 tablet daily. ?  ?Labs: ?06/07/2021 Creatinine 0.77, BUN 9, Potassium 4.1, Sodium 136, GFR >90 ?12/03/2020 Creatinine 0.67, BUN 7, Potassium 4.0, Sodium 136, GFR >60 ?A complete set of results can be found in Results Review. ?  ?Recommendations:  Left voice mail with ICM number and encouraged to call if experiencing any fluid symptoms. ?  ?Follow-up plan: ICM clinic phone appointment on 12/13/2021 to recheck fluid levels.   91 day device clinic remote transmission 02/10/2022.   ?  ?EP/Cardiology Office Visits:  06/03/2022 with Dr. Rayann Heman.   ?  ?Copy of ICM check sent to Dr. Rayann Heman.   ? ?3 month ICM trend: 12/06/2021. ? ? ? ?12-14 Month ICM trend:  ? ? ? ?Rosalene Billings, RN ?12/06/2021 ?7:51 AM ? ?

## 2021-12-07 ENCOUNTER — Telehealth: Payer: Self-pay

## 2021-12-07 NOTE — Telephone Encounter (Signed)
Remote ICM transmission received.  Attempted call to patient regarding ICM remote transmission and left detailed message per DPR to return call.  Advised to return call for any fluid symptoms or questions.      

## 2021-12-08 ENCOUNTER — Encounter (HOSPITAL_COMMUNITY): Payer: Self-pay | Admitting: Physician Assistant

## 2021-12-08 ENCOUNTER — Ambulatory Visit (HOSPITAL_COMMUNITY)
Admission: RE | Admit: 2021-12-08 | Discharge: 2021-12-08 | Disposition: A | Discharge status: Home / Self Care | Payer: Medicare Other | Source: Ambulatory Visit | Attending: Physician Assistant | Admitting: Physician Assistant

## 2021-12-08 VITALS — BP 152/60 | HR 61 | Ht 70.0 in | Wt 189.0 lb

## 2021-12-08 DIAGNOSIS — Z951 Presence of aortocoronary bypass graft: Secondary | ICD-10-CM | POA: Diagnosis not present

## 2021-12-08 DIAGNOSIS — I5022 Chronic systolic (congestive) heart failure: Secondary | ICD-10-CM | POA: Diagnosis not present

## 2021-12-08 DIAGNOSIS — I447 Left bundle-branch block, unspecified: Secondary | ICD-10-CM | POA: Diagnosis not present

## 2021-12-08 DIAGNOSIS — I4819 Other persistent atrial fibrillation: Secondary | ICD-10-CM | POA: Insufficient documentation

## 2021-12-08 DIAGNOSIS — I2510 Atherosclerotic heart disease of native coronary artery without angina pectoris: Secondary | ICD-10-CM | POA: Diagnosis not present

## 2021-12-08 DIAGNOSIS — E119 Type 2 diabetes mellitus without complications: Secondary | ICD-10-CM | POA: Diagnosis not present

## 2021-12-08 DIAGNOSIS — I110 Hypertensive heart disease with heart failure: Secondary | ICD-10-CM | POA: Diagnosis not present

## 2021-12-08 DIAGNOSIS — D6869 Other thrombophilia: Secondary | ICD-10-CM

## 2021-12-08 DIAGNOSIS — Z7901 Long term (current) use of anticoagulants: Secondary | ICD-10-CM | POA: Insufficient documentation

## 2021-12-08 DIAGNOSIS — I252 Old myocardial infarction: Secondary | ICD-10-CM | POA: Insufficient documentation

## 2021-12-08 LAB — BASIC METABOLIC PANEL
Anion gap: 7 (ref 5–15)
BUN: 8 mg/dL (ref 8–23)
CO2: 23 mmol/L (ref 22–32)
Calcium: 9.2 mg/dL (ref 8.9–10.3)
Chloride: 104 mmol/L (ref 98–111)
Creatinine, Ser: 0.8 mg/dL (ref 0.61–1.24)
GFR, Estimated: >60 mL/min (ref >60)
Glucose, Bld: 199 mg/dL — ABNORMAL HIGH (ref 70–99)
Potassium: 4.1 mmol/L (ref 3.5–5.1)
Sodium: 134 mmol/L — ABNORMAL LOW (ref 135–145)

## 2021-12-08 LAB — MAGNESIUM: Magnesium: 2 mg/dL (ref 1.7–2.4)

## 2021-12-08 NOTE — Progress Notes (Signed)
? ? ?Primary Care Physician: Dr Stoney Bang ?Primary Cardiologist: Dr Candis Musa ?Primary Electrophysiologist: Dr Rayann Heman ?Referring Physician: Tommye Standard PA ? ? ?Brian Bruce is a 76 y.o. male with a history of CAD (CABG 1998), DM, persistent Afib, ICM s/p ICD, chronic CHF (systolic), LBBB who presents for follow up in the [facility]. Patient seen in EP clinic post ICD implant with persistent afib. He is on warfarin for a CHADS2VASC score of 4. Patient would like to pursue Tikosyn. He is fairly unaware of his arrhythmia with no increased SOB, fatigue, palpitations, or heart racing. He denies significant snoring or alcohol use. Patient is s/p dofetilide loading 1/5-08/09/19. He converted to SR on the medication and did not require DCCV.  ? ?On follow up today, patient reports that he has done well since his last visit. He denies any tachypalpitations. His afib burden remains 0% on last device check. The impedance on his device showed possible fluid accumulation. He denies any SOB, edema, or orthopnea.  ? ?Today, he denies symptoms of palpitations, chest pain, shortness of breath, orthopnea, PND, lower extremity edema, dizziness, presyncope, syncope, snoring, daytime somnolence, bleeding, or neurologic sequela. The patient is tolerating medications without difficulties and is otherwise without complaint today.  ? ? ?Atrial Fibrillation Risk Factors: ? ?he does not have symptoms or diagnosis of sleep apnea. ?he does not have a history of rheumatic fever. ? ? ?he has a BMI of Body mass index is 27.12 kg/m?Marland KitchenMarland Kitchen ?Filed Weights  ? 12/08/21 0917  ?Weight: 85.7 kg  ? ? ?Family History  ?Problem Relation Age of Onset  ? Cancer Mother   ? Heart failure Father   ? ? ? ?Atrial Fibrillation Management history: ? ?Previous antiarrhythmic drugs: dofetilide ?Previous cardioversions: none ?Previous ablations: none ?CHADS2VASC score: 4 ?Anticoagulation history: warfarin  ? ? ?Past Medical History:  ?Diagnosis  Date  ? Asthma   ? CHF (congestive heart failure) ([place])   ? Coronary artery disease   ? Diabetes mellitus without complication ([place])   ? Ischemic cardiomyopathy   ? LBBB (left bundle branch block)   ? Persistent atrial fibrillation ([place])   ? ?Past Surgical History:  ?Procedure Laterality Date  ? BIV ICD INSERTION CRT-D N/A 05/16/2019  ? Procedure: BIV ICD INSERTION CRT-D;  Surgeon: Thompson Grayer, MD;  Location: [place] [facility];  Service: Cardiovascular;  Laterality: N/A;  ? CORONARY ARTERY BYPASS GRAFT  1998  ? 4-vessel  ? RIGHT/LEFT HEART CATH AND CORONARY/GRAFT ANGIOGRAPHY N/A 03/07/2019  ? Procedure: RIGHT/LEFT HEART CATH AND CORONARY/GRAFT ANGIOGRAPHY;  Surgeon: Nelva Bush, MD;  Location: [place] [facility];  Service: Cardiovascular;  Laterality: N/A;  ? ? ?Current Outpatient Medications  ?Medication Sig Dispense Refill  ? acetaminophen (TYLENOL) 500 MG tablet Take 500 mg by mouth every 6 (six) hours as needed for moderate pain.    ? amLODipine (NORVASC) 10 MG tablet Take 1 tablet by mouth daily.    ? aspirin EC 81 MG tablet Take 81 mg by mouth daily.    ? atorvastatin (LIPITOR) 80 MG tablet Take 80 mg by mouth every evening.    ? augmented betamethasone dipropionate (DIPROLENE-AF) 0.05 % cream Apply 1 application topically 2 (two) times daily as needed (rash).    ? carvedilol (COREG) 25 MG tablet Take 1 tablet by mouth 2 (two) times daily.    ? Cholecalciferol (VITAMIN D) 50 MCG (2000 UT) tablet Take 2,000 Units by mouth daily.    ? Coenzyme Q10 (CO Q-10)  100 MG CAPS Take 100 mg by mouth daily.    ? digoxin (LANOXIN) 0.125 MG tablet Take 1 tablet by mouth once daily 90 tablet 1  ? dofetilide (TIKOSYN) 500 MCG capsule Take 1 capsule by mouth twice daily 180 capsule 3  ? glimepiride (AMARYL) 2 MG tablet Take 1 tablet by mouth daily.    ? GLUCOSAMINE-CHONDROITIN PO Take 1 tablet by mouth 2 (two) times daily.    ? hydrALAZINE (APRESOLINE) 25 MG tablet Take by mouth 3 (three) times daily.     ? ketotifen  (ZADITOR) 0.025 % ophthalmic solution Place 1 drop into both eyes daily.    ? magnesium oxide (MAG-OX) 400 (240 Mg) MG tablet Take 1 tablet (400 mg total) by mouth 2 (two) times daily. 180 tablet 3  ? metFORMIN (GLUCOPHAGE) 1000 MG tablet Take 1 tablet (1,000 mg total) by mouth 2 (two) times daily.    ? olmesartan (BENICAR) 20 MG tablet     ? torsemide (DEMADEX) 20 MG tablet Take 1 tablet by mouth once daily 90 tablet 2  ? warfarin (COUMADIN) 5 MG tablet Take 7.5 mg by mouth daily. except Thursday 10 mg    ? ?No current facility-administered medications for this encounter.  ? ? ?No Known Allergies ? ?Social History  ? ?Socioeconomic History  ? Marital status: Widowed  ?  Spouse name: Not on file  ? Number of children: Not on file  ? Years of education: Not on file  ? Highest education level: Not on file  ?Occupational History  ? Not on file  ?Tobacco Use  ? Smoking status: Former  ?  Types: Cigarettes  ?  Quit date: 39  ?  Years since quitting: 43.3  ? Smokeless tobacco: Never  ? Tobacco comments:  ?  Former smoker 12/08/21  ?Vaping Use  ? Vaping Use: Never used  ?Substance and Sexual Activity  ? Alcohol use: Yes  ?  Alcohol/week: 14.0 standard drinks  ?  Types: 14 Cans of beer per week  ?  Comment: 2 beers daily 12/08/21  ? Drug use: Never  ? Sexual activity: Not on file  ?Other Topics Concern  ? Not on file  ?Social History Narrative  ? Lives in [place] with spouse.  Retired.   Previously worked as a Scientist, water quality  ? ?Social Determinants of Health  ? ?Financial Resource Strain: Not on file  ?Food Insecurity: Not on file  ?Transportation Needs: Not on file  ?Physical Activity: Not on file  ?Stress: Not on file  ?Social Connections: Not on file  ?Intimate Partner Violence: Not on file  ? ? ? ?ROS- All systems are reviewed and negative except as per the HPI above. ? ?Physical Exam: ?Vitals:  ? 12/08/21 0917  ?BP: (!) 152/60  ?Pulse: 61  ?Weight: 85.7 kg  ?Height: '5\' 10"'$  (1.778 m)  ? ? ? ?GEN- The patient is  a well appearing elderly male, alert and oriented x 3 today.   ?HEENT-head normocephalic, atraumatic, sclera clear, conjunctiva pink, hearing intact, trachea midline. ?Lungs- Clear to ausculation bilaterally, normal work of breathing ?Heart- Regular rate and rhythm, no murmurs, rubs or gallops  ?GI- soft, NT, ND, + BS ?Extremities- no clubbing, cyanosis, or edema ?MS- no significant deformity or atrophy ?Skin- no rash or lesion ?Psych- euthymic mood, full affect ?Neuro- strength and sensation are intact ? ? ?Wt Readings from Last 3 Encounters:  ?12/08/21 85.7 kg  ?06/04/21 89 kg  ?12/03/20 86.1 kg  ? ? ?EKG  today demonstrates ?AV dual paced rhythm ?Vent. rate 61 BPM ?PR interval 186 ms ?QRS duration 164 ms ?QT/QTcB 504/507 ms ? ?Echo 12/27/19 demonstrated [facility]) ? 1. The left ventricle is normal in size with mildly increased wall  ?thickness.  ?  2. The left ventricular systolic function is overall normal, LVEF is  ?visually estimated at 50-55%.  ?  3. There is grade I diastolic dysfunction (impaired relaxation).  ?  4. There is moderate aortic valve stenosis.  ?  5. The left atrium is moderately dilated in size.  ?  6. The right ventricle is normal in size, with normal systolic function.  ?  7. The right atrium is moderately dilated  in size.  ?  8. Overall left ventricular systolic function has normalized in comparison  ?to the study dated 03/2019.  ? ? ?Epic records are reviewed at length today ? ?Assessment and Plan: ? ?1. Persistent atrial fibrillation ?Patient is s/p dofetilide loading 1/5-08/09/19. ?Patient remains in [place] with 0% AF burden on last remote device check. ?Continue dofetilide 500 mcg BID. QT stable.  ?Check bmet/mag today.  ?Continue warfarin ? ?This patients CHA2DS2-VASc Score and unadjusted Ischemic Stroke Rate (% per year) is equal to 4.8 % stroke rate/year from a score of 4 ? ?Above score calculated as 1 point each if present [CHF, HTN, DM, Vascular=MI/PAD/Aortic Plaque, Age if 32-74, or  Male] ?Above score calculated as 2 points each if present [Age > 75, or Stroke/TIA/TE] ? ?2. CAD ?No anginal symptoms. ? ?3. ICM/Chronic systolic CHF ?S/p ICD, followed by Dr Rayann Heman and the device clinic. ?CorVue

## 2021-12-13 ENCOUNTER — Ambulatory Visit (INDEPENDENT_AMBULATORY_CARE_PROVIDER_SITE_OTHER): Payer: Medicare Other

## 2021-12-13 DIAGNOSIS — Z95810 Presence of automatic (implantable) cardiac defibrillator: Secondary | ICD-10-CM

## 2021-12-13 DIAGNOSIS — I5022 Chronic systolic (congestive) heart failure: Secondary | ICD-10-CM

## 2021-12-14 NOTE — Progress Notes (Signed)
EPIC Encounter for ICM Monitoring ? ?Patient Name: Brian Bruce is a 76 y.o. male ?Date: 12/14/2021 ?Primary Care Physican: Neale Burly, MD ?Primary Cardiologist: [facility] ?Electrophysiologist: Allred ?Bi-V Pacing: 98%            ?11/03/2021 Weight: 188 lbs ?  ?AT/AF Burden 0% ?                                                         ?Spoke with patient and heart failure questions reviewed.  Pt asymptomatic for fluid accumulation.  Reports feeling well at this time and voices no complaints.  ?  ?CorVue thoracic impedance suggesting fluid levels returned to normal.  ?  ?Prescribed: Torsemide 20 mg take 1 tablet daily. ?  ?Labs: ?06/07/2021 Creatinine 0.77, BUN 9, Potassium 4.1, Sodium 136, GFR >90 ?12/03/2020 Creatinine 0.67, BUN 7, Potassium 4.0, Sodium 136, GFR >60 ?A complete set of results can be found in Results Review. ?  ?Recommendations:  No changes and encouraged to call if experiencing any fluid symptoms. ?  ?Follow-up plan: ICM clinic phone appointment on 01/17/2022.   91 day device clinic remote transmission 02/10/2022.   ?  ?EP/Cardiology Office Visits:  06/03/2022 with Dr. Rayann Heman.   ?  ?Copy of ICM check sent to Dr. Rayann Heman.   ? ?3 month ICM trend: 12/13/2021. ? ? ? ?12-14 Month ICM trend:  ? ? ? ?Rosalene Billings, RN ?12/14/2021 ?3:44 PM ? ?

## 2021-12-31 DIAGNOSIS — Z7901 Long term (current) use of anticoagulants: Secondary | ICD-10-CM | POA: Diagnosis not present

## 2022-01-03 DIAGNOSIS — Z08 Encounter for follow-up examination after completed treatment for malignant neoplasm: Secondary | ICD-10-CM | POA: Diagnosis not present

## 2022-01-03 DIAGNOSIS — H35371 Puckering of macula, right eye: Secondary | ICD-10-CM | POA: Diagnosis not present

## 2022-01-03 DIAGNOSIS — Z85828 Personal history of other malignant neoplasm of skin: Secondary | ICD-10-CM | POA: Diagnosis not present

## 2022-01-17 ENCOUNTER — Ambulatory Visit (INDEPENDENT_AMBULATORY_CARE_PROVIDER_SITE_OTHER): Payer: Medicare Other

## 2022-01-17 DIAGNOSIS — I5022 Chronic systolic (congestive) heart failure: Secondary | ICD-10-CM

## 2022-01-17 DIAGNOSIS — Z95810 Presence of automatic (implantable) cardiac defibrillator: Secondary | ICD-10-CM | POA: Diagnosis not present

## 2022-01-19 ENCOUNTER — Telehealth: Payer: Self-pay

## 2022-01-19 NOTE — Progress Notes (Signed)
EPIC Encounter for ICM Monitoring  Patient Name: Brian Bruce is a 76 y.o. male Date: 01/19/2022 Primary Care Physican: Neale Burly, MD Primary Cardiologist: [facility] Electrophysiologist: Allred Bi-V Pacing: 98%            11/03/2021 Weight: 188 lbs   AT/AF Burden 0%                                                          Attempted call to patient and unable to reach.  Left detailed message per DPR regarding transmission. Transmission reviewed.    CorVue thoracic impedance suggesting possible dryness but returning to baseline.    Prescribed: Torsemide 20 mg take 1 tablet daily.   Labs: 12/08/2021 Creatinine 0.80, BUN 8, Potassium 4.1, Sodium 134, GFR >60 A complete set of results can be found in Results Review.   Recommendations:  Left voice mail with ICM number and encouraged to call if experiencing any fluid symptoms.   Follow-up plan: ICM clinic phone appointment on 02/28/2022.   91 day device clinic remote transmission 02/10/2022.     EP/Cardiology Office Visits:  06/03/2022 with Dr. Rayann Heman.     Copy of ICM check sent to Dr. Rayann Heman.    3 month ICM trend: 01/17/2022.    12-14 Month ICM trend:     Rosalene Billings, RN 01/19/2022 2:23 PM

## 2022-01-19 NOTE — Telephone Encounter (Signed)
Remote ICM transmission received.  Attempted call to patient regarding ICM remote transmission and left detailed message per DPR.  Advised to return call for any fluid symptoms or questions. Next ICM remote transmission scheduled 02/28/2022.    

## 2022-01-26 DIAGNOSIS — E1159 Type 2 diabetes mellitus with other circulatory complications: Secondary | ICD-10-CM | POA: Diagnosis not present

## 2022-01-26 DIAGNOSIS — Z7901 Long term (current) use of anticoagulants: Secondary | ICD-10-CM | POA: Diagnosis not present

## 2022-01-26 DIAGNOSIS — I10 Essential (primary) hypertension: Secondary | ICD-10-CM | POA: Diagnosis not present

## 2022-01-26 DIAGNOSIS — I5022 Chronic systolic (congestive) heart failure: Secondary | ICD-10-CM | POA: Diagnosis not present

## 2022-01-26 DIAGNOSIS — E7849 Other hyperlipidemia: Secondary | ICD-10-CM | POA: Diagnosis not present

## 2022-02-10 ENCOUNTER — Ambulatory Visit (INDEPENDENT_AMBULATORY_CARE_PROVIDER_SITE_OTHER): Payer: Medicare Other

## 2022-02-10 DIAGNOSIS — I255 Ischemic cardiomyopathy: Secondary | ICD-10-CM

## 2022-02-10 LAB — CUP PACEART REMOTE DEVICE CHECK
Battery Remaining Longevity: 54 mo
Battery Remaining Percentage: 62 %
Battery Voltage: 2.95 V
Brady Statistic AP VP Percent: 94 %
Brady Statistic AP VS Percent: 1 %
Brady Statistic AS VP Percent: 4.1 %
Brady Statistic AS VS Percent: 1.2 %
Brady Statistic RA Percent Paced: 95 %
Date Time Interrogation Session: 20230713020051
HighPow Impedance: 65 Ohm
Implantable Lead Implant Date: 20201015
Implantable Lead Implant Date: 20201015
Implantable Lead Implant Date: 20201015
Implantable Lead Location: 753858
Implantable Lead Location: 753859
Implantable Lead Location: 753860
Implantable Pulse Generator Implant Date: 20201015
Lead Channel Impedance Value: 400 Ohm
Lead Channel Impedance Value: 400 Ohm
Lead Channel Impedance Value: 490 Ohm
Lead Channel Pacing Threshold Amplitude: 0.625 V
Lead Channel Pacing Threshold Amplitude: 1 V
Lead Channel Pacing Threshold Amplitude: 1.625 V
Lead Channel Pacing Threshold Pulse Width: 0.5 ms
Lead Channel Pacing Threshold Pulse Width: 0.5 ms
Lead Channel Pacing Threshold Pulse Width: 0.8 ms
Lead Channel Sensing Intrinsic Amplitude: 12 mV
Lead Channel Sensing Intrinsic Amplitude: 2.4 mV
Lead Channel Setting Pacing Amplitude: 1.625
Lead Channel Setting Pacing Amplitude: 2 V
Lead Channel Setting Pacing Amplitude: 2.125
Lead Channel Setting Pacing Pulse Width: 0.5 ms
Lead Channel Setting Pacing Pulse Width: 0.8 ms
Lead Channel Setting Sensing Sensitivity: 0.5 mV
Pulse Gen Serial Number: 111006847

## 2022-02-25 NOTE — Progress Notes (Signed)
Remote ICD transmission.   

## 2022-02-28 ENCOUNTER — Ambulatory Visit (INDEPENDENT_AMBULATORY_CARE_PROVIDER_SITE_OTHER): Payer: Medicare Other

## 2022-02-28 DIAGNOSIS — Z95810 Presence of automatic (implantable) cardiac defibrillator: Secondary | ICD-10-CM | POA: Diagnosis not present

## 2022-02-28 DIAGNOSIS — I5022 Chronic systolic (congestive) heart failure: Secondary | ICD-10-CM | POA: Diagnosis not present

## 2022-03-02 NOTE — Progress Notes (Signed)
EPIC Encounter for ICM Monitoring  Patient Name: Brian Bruce is a 76 y.o. male Date: 03/02/2022 Primary Care Physican: Neale Burly, MD Primary Cardiologist: [facility] Electrophysiologist: Allred Bi-V Pacing: 98%            11/03/2021 Weight: 188 lbs 03/02/2022 Weight: 182 lbs   AT/AF Burden 0%                                                          Spoke with patient and heart failure questions reviewed.  Pt asymptomatic for fluid accumulation.  Reports feeling well at this time and voices no complaints.    CorVue thoracic impedance suggesting normal fluid levels.    Prescribed: Torsemide 20 mg take 1 tablet daily.   Labs: 12/08/2021 Creatinine 0.80, BUN 8, Potassium 4.1, Sodium 134, GFR >60 A complete set of results can be found in Results Review.   Recommendations:  No changes and encouraged to call if experiencing any fluid symptoms.   Follow-up plan: ICM clinic phone appointment on 04/05/2022.   91 day device clinic remote transmission 05/12/2022.     EP/Cardiology Office Visits:  06/03/2022 with Dr. Rayann Heman.     Copy of ICM check sent to Dr. Rayann Heman.    3 month ICM trend: 02/28/2022.    12-14 Month ICM trend:     Rosalene Billings, RN 03/02/2022 2:41 PM

## 2022-03-18 DIAGNOSIS — Z7901 Long term (current) use of anticoagulants: Secondary | ICD-10-CM | POA: Diagnosis not present

## 2022-03-30 DIAGNOSIS — I10 Essential (primary) hypertension: Secondary | ICD-10-CM | POA: Diagnosis not present

## 2022-03-30 DIAGNOSIS — I2510 Atherosclerotic heart disease of native coronary artery without angina pectoris: Secondary | ICD-10-CM | POA: Diagnosis not present

## 2022-03-30 DIAGNOSIS — I4811 Longstanding persistent atrial fibrillation: Secondary | ICD-10-CM | POA: Diagnosis not present

## 2022-03-30 DIAGNOSIS — I5022 Chronic systolic (congestive) heart failure: Secondary | ICD-10-CM | POA: Diagnosis not present

## 2022-04-05 ENCOUNTER — Ambulatory Visit (INDEPENDENT_AMBULATORY_CARE_PROVIDER_SITE_OTHER): Payer: Medicare Other

## 2022-04-05 DIAGNOSIS — I5022 Chronic systolic (congestive) heart failure: Secondary | ICD-10-CM | POA: Diagnosis not present

## 2022-04-05 DIAGNOSIS — Z95810 Presence of automatic (implantable) cardiac defibrillator: Secondary | ICD-10-CM | POA: Diagnosis not present

## 2022-04-06 NOTE — Progress Notes (Signed)
EPIC Encounter for ICM Monitoring  Patient Name: Brian Bruce is a 76 y.o. male Date: 04/06/2022 Primary Care Physican: Neale Burly, MD Primary Cardiologist: [facility] Electrophysiologist: Mealor Bi-V Pacing: 98%            11/03/2021 Weight: 188 lbs 03/02/2022 Weight: 182 lbs   AT/AF Burden 0%                                                          Spoke with patient and heart failure questions reviewed.  Pt asymptomatic for fluid accumulation.  Reports feeling well at this time and voices no complaints. Was on vacation during decreased impedance.    CorVue thoracic impedance suggesting normal fluid levels.    Prescribed: Torsemide 20 mg take 1 tablet daily.   Labs: 12/08/2021 Creatinine 0.80, BUN 8, Potassium 4.1, Sodium 134, GFR >60 A complete set of results can be found in Results Review.   Recommendations:  No changes and encouraged to call if experiencing any fluid symptoms.   Follow-up plan: ICM clinic phone appointment on 05/16/2022.   91 day device clinic remote transmission 05/12/2022.     EP/Cardiology Office Visits:  06/03/2022 with Dr. Rayann Heman.     Copy of ICM check sent to Dr Myles Gip (replacing Dr. Rayann Heman).    3 month ICM trend: 04/05/2022.    12-14 Month ICM trend:     Rosalene Billings, RN 04/06/2022 3:47 PM

## 2022-04-22 DIAGNOSIS — Z7901 Long term (current) use of anticoagulants: Secondary | ICD-10-CM | POA: Diagnosis not present

## 2022-05-10 ENCOUNTER — Encounter: Payer: Self-pay | Admitting: Cardiovascular Disease

## 2022-05-11 DIAGNOSIS — E7849 Other hyperlipidemia: Secondary | ICD-10-CM | POA: Diagnosis not present

## 2022-05-11 DIAGNOSIS — I10 Essential (primary) hypertension: Secondary | ICD-10-CM | POA: Diagnosis not present

## 2022-05-11 DIAGNOSIS — I5022 Chronic systolic (congestive) heart failure: Secondary | ICD-10-CM | POA: Diagnosis not present

## 2022-05-11 DIAGNOSIS — M179 Osteoarthritis of knee, unspecified: Secondary | ICD-10-CM | POA: Diagnosis not present

## 2022-05-11 DIAGNOSIS — E1159 Type 2 diabetes mellitus with other circulatory complications: Secondary | ICD-10-CM | POA: Diagnosis not present

## 2022-05-12 ENCOUNTER — Ambulatory Visit (INDEPENDENT_AMBULATORY_CARE_PROVIDER_SITE_OTHER): Payer: Medicare Other

## 2022-05-12 ENCOUNTER — Other Ambulatory Visit: Payer: Self-pay | Admitting: Internal Medicine

## 2022-05-12 DIAGNOSIS — I5022 Chronic systolic (congestive) heart failure: Secondary | ICD-10-CM | POA: Diagnosis not present

## 2022-05-12 LAB — CUP PACEART REMOTE DEVICE CHECK
Battery Remaining Longevity: 48 mo
Battery Remaining Percentage: 58 %
Battery Voltage: 2.95 V
Brady Statistic AP VP Percent: 93 %
Brady Statistic AP VS Percent: 1 %
Brady Statistic AS VP Percent: 4.4 %
Brady Statistic AS VS Percent: 1.6 %
Brady Statistic RA Percent Paced: 94 %
Date Time Interrogation Session: 20231012020037
HighPow Impedance: 56 Ohm
Implantable Lead Implant Date: 20201015
Implantable Lead Implant Date: 20201015
Implantable Lead Implant Date: 20201015
Implantable Lead Location: 753858
Implantable Lead Location: 753859
Implantable Lead Location: 753860
Implantable Pulse Generator Implant Date: 20201015
Lead Channel Impedance Value: 360 Ohm
Lead Channel Impedance Value: 390 Ohm
Lead Channel Impedance Value: 500 Ohm
Lead Channel Pacing Threshold Amplitude: 0.625 V
Lead Channel Pacing Threshold Amplitude: 1 V
Lead Channel Pacing Threshold Amplitude: 1.75 V
Lead Channel Pacing Threshold Pulse Width: 0.5 ms
Lead Channel Pacing Threshold Pulse Width: 0.5 ms
Lead Channel Pacing Threshold Pulse Width: 0.8 ms
Lead Channel Sensing Intrinsic Amplitude: 12 mV
Lead Channel Sensing Intrinsic Amplitude: 3.4 mV
Lead Channel Setting Pacing Amplitude: 1.625
Lead Channel Setting Pacing Amplitude: 2 V
Lead Channel Setting Pacing Amplitude: 2.25 V
Lead Channel Setting Pacing Pulse Width: 0.5 ms
Lead Channel Setting Pacing Pulse Width: 0.8 ms
Lead Channel Setting Sensing Sensitivity: 0.5 mV
Pulse Gen Serial Number: 111006847

## 2022-05-12 NOTE — Telephone Encounter (Signed)
This is a A-Fib clinic pt 

## 2022-05-14 DIAGNOSIS — Z23 Encounter for immunization: Secondary | ICD-10-CM | POA: Diagnosis not present

## 2022-05-16 ENCOUNTER — Ambulatory Visit (INDEPENDENT_AMBULATORY_CARE_PROVIDER_SITE_OTHER): Payer: Medicare Other

## 2022-05-16 DIAGNOSIS — I5022 Chronic systolic (congestive) heart failure: Secondary | ICD-10-CM | POA: Diagnosis not present

## 2022-05-16 DIAGNOSIS — Z95810 Presence of automatic (implantable) cardiac defibrillator: Secondary | ICD-10-CM

## 2022-05-17 ENCOUNTER — Telehealth: Payer: Self-pay

## 2022-05-17 NOTE — Progress Notes (Signed)
EPIC Encounter for ICM Monitoring  Patient Name: Brian Bruce is a 76 y.o. male Date: 05/17/2022 Primary Care Physican: Neale Burly, MD Primary Cardiologist: [facility] Electrophysiologist: Mealor Bi-V Pacing: 98%            11/03/2021 Weight: 188 lbs 03/02/2022 Weight: 182 lbs   AT/AF Burden 0%                                                          Attempted call to patient and unable to reach.  Left detailed message per DPR regarding transmission. Transmission reviewed.    CorVue thoracic impedance suggesting possible fluid accumulation starting 10/12.   Prescribed: Torsemide 20 mg take 1 tablet daily.   Labs: 12/08/2021 Creatinine 0.80, BUN 8, Potassium 4.1, Sodium 134, GFR >60 A complete set of results can be found in Results Review.   Recommendations:  Left voice mail with ICM number and encouraged to call if experiencing any fluid symptoms.   Follow-up plan: ICM clinic phone appointment on 05/23/2022 to recheck fluid levels   91 day device clinic remote transmission 08/11/2022.     EP/Cardiology Office Visits:  06/07/2022 with Dr. Myles Gip.     Copy of ICM check sent to Dr Myles Gip.   3 month ICM trend: 05/16/2022.    12-14 Month ICM trend:     Rosalene Billings, RN 05/17/2022 3:09 PM

## 2022-05-17 NOTE — Telephone Encounter (Signed)
Remote ICM transmission received.  Attempted call to patient regarding ICM remote transmission and left detailed message per DPR.  Advised to return call for any fluid symptoms or questions. Next ICM remote transmission scheduled 05/23/2022.    

## 2022-05-18 NOTE — Progress Notes (Signed)
Spoke with patient and heart failure questions reviewed.  Transmission results reviewed.  Pt asymptomatic for fluid accumulation.  Reports feeling well at this time and voices no complaints.  He typically take extra 1/2 tablet Torsemide if needed based on weight.  His weight is stable at 182 lbs.  Since he is feeling well, will reschedule next ICM remote transmission for 11/20.

## 2022-05-19 NOTE — Progress Notes (Signed)
Remote ICD transmission.   

## 2022-05-26 ENCOUNTER — Other Ambulatory Visit: Payer: Self-pay | Admitting: Internal Medicine

## 2022-05-26 NOTE — Telephone Encounter (Signed)
This is a [place] pt °

## 2022-06-03 ENCOUNTER — Encounter: Payer: Medicare Other | Admitting: Internal Medicine

## 2022-06-07 ENCOUNTER — Encounter: Payer: Self-pay | Admitting: Cardiovascular Disease

## 2022-06-07 ENCOUNTER — Other Ambulatory Visit
Admission: RE | Admit: 2022-06-07 | Discharge: 2022-06-07 | Disposition: A | Discharge status: Home / Self Care | Payer: Medicare Other | Source: Ambulatory Visit | Attending: Cardiovascular Disease | Admitting: Cardiovascular Disease

## 2022-06-07 ENCOUNTER — Ambulatory Visit (INDEPENDENT_AMBULATORY_CARE_PROVIDER_SITE_OTHER): Payer: Medicare Other | Admitting: Cardiovascular Disease

## 2022-06-07 VITALS — BP 158/60 | HR 62 | Ht 70.0 in | Wt 189.6 lb

## 2022-06-07 DIAGNOSIS — Z95810 Presence of automatic (implantable) cardiac defibrillator: Secondary | ICD-10-CM | POA: Diagnosis not present

## 2022-06-07 DIAGNOSIS — I5022 Chronic systolic (congestive) heart failure: Secondary | ICD-10-CM

## 2022-06-07 DIAGNOSIS — D6869 Other thrombophilia: Secondary | ICD-10-CM

## 2022-06-07 DIAGNOSIS — I10 Essential (primary) hypertension: Secondary | ICD-10-CM | POA: Diagnosis not present

## 2022-06-07 DIAGNOSIS — Z79899 Other long term (current) drug therapy: Secondary | ICD-10-CM

## 2022-06-07 DIAGNOSIS — I255 Ischemic cardiomyopathy: Secondary | ICD-10-CM

## 2022-06-07 DIAGNOSIS — I4819 Other persistent atrial fibrillation: Secondary | ICD-10-CM

## 2022-06-07 LAB — BASIC METABOLIC PANEL
Anion gap: 8 (ref 5–15)
BUN: 17 mg/dL (ref 8–23)
CO2: 25 mmol/L (ref 22–32)
Calcium: 8.9 mg/dL (ref 8.9–10.3)
Chloride: 102 mmol/L (ref 98–111)
Creatinine, Ser: 0.75 mg/dL (ref 0.61–1.24)
GFR, Estimated: >60 mL/min (ref >60)
Glucose, Bld: 126 mg/dL — ABNORMAL HIGH (ref 70–99)
Potassium: 4.1 mmol/L (ref 3.5–5.1)
Sodium: 135 mmol/L (ref 135–145)

## 2022-06-07 LAB — CBC
HCT: 37 % — ABNORMAL LOW (ref 39.0–52.0)
Hemoglobin: 12.3 g/dL — ABNORMAL LOW (ref 13.0–17.0)
MCH: 31 pg (ref 26.0–34.0)
MCHC: 33.2 g/dL (ref 30.0–36.0)
MCV: 93.2 fL (ref 80.0–100.0)
Platelets: 292 10*3/uL (ref 150–400)
RBC: 3.97 MIL/uL — ABNORMAL LOW (ref 4.22–5.81)
RDW: 12.9 % (ref 11.5–15.5)
WBC: 7 10*3/uL (ref 4.0–10.5)
nRBC: 0 % (ref 0.0–0.2)

## 2022-06-07 LAB — MAGNESIUM: Magnesium: 2.1 mg/dL (ref 1.7–2.4)

## 2022-06-07 NOTE — Patient Instructions (Signed)
Medication Instructions:  Continue all current medications.  Labwork: BMET, Mg, CBC - orders given today Office will contact with results via phone, letter or mychart.     Testing/Procedures: none  Follow-Up: 1 year - Dr.  Myles Gip 6 months - afib clinic  Any Other Special Instructions Will Be Listed Below (If Applicable).   If you need a refill on your cardiac medications before your next appointment, please call your pharmacy.

## 2022-06-07 NOTE — Progress Notes (Signed)
PCP: Neale Burly, MD Primary Cardiologist: Dr Candis Musa Primary EP: Dr Oneita Kras is a 76 y.o. male who presents today for routine electrophysiology followup.  Since last being seen in our clinic, the patient reports doing very well.  Today, he denies symptoms of palpitations, chest pain, shortness of breath,  lower extremity edema, dizziness, presyncope, syncope, or ICD shocks.  he has no device related complaints -- no new tenderness, drainage, redness.  The patient is otherwise without complaint today.   Past Medical History:  Diagnosis Date   Asthma    CHF (congestive heart failure) (HCC)    Coronary artery disease    Diabetes mellitus without complication (HCC)    Ischemic cardiomyopathy    LBBB (left bundle branch block)    Persistent atrial fibrillation [facility])    Past Surgical History:  Procedure Laterality Date   BIV ICD INSERTION CRT-D N/A 05/16/2019   Procedure: BIV ICD INSERTION CRT-D;  Surgeon: Thompson Grayer, MD;  Location: [place] [facility];  Service: Cardiovascular;  Laterality: N/A;   CORONARY ARTERY BYPASS GRAFT  1998   4-vessel   RIGHT/LEFT HEART CATH AND CORONARY/GRAFT ANGIOGRAPHY N/A 03/07/2019   Procedure: RIGHT/LEFT HEART CATH AND CORONARY/GRAFT ANGIOGRAPHY;  Surgeon: Nelva Bush, MD;  Location: [place] [facility];  Service: Cardiovascular;  Laterality: N/A;    ROS- all systems are reviewed and negative except as per HPI above  Current Outpatient Medications  Medication Sig Dispense Refill   acetaminophen (TYLENOL) 500 MG tablet Take 500 mg by mouth every 6 (six) hours as needed for moderate pain.     amLODipine (NORVASC) 10 MG tablet Take 1 tablet by mouth daily.     aspirin EC 81 MG tablet Take 81 mg by mouth daily.     atorvastatin (LIPITOR) 80 MG tablet Take 80 mg by mouth every evening.     augmented betamethasone dipropionate (DIPROLENE-AF) 0.05 % cream Apply 1 application topically 2 (two) times daily as needed (rash).     carvedilol  (COREG) 25 MG tablet Take 1 tablet by mouth 2 (two) times daily.     Cholecalciferol (VITAMIN D) 50 MCG (2000 UT) tablet Take 1,000 Units by mouth daily.     Coenzyme Q10 (CO Q-10) 100 MG CAPS Take 100 mg by mouth daily.     diclofenac Sodium (VOLTAREN) 1 % GEL Apply 2 g topically 4 (four) times daily.     digoxin (LANOXIN) 0.125 MG tablet Take 1 tablet by mouth once daily 90 tablet 0   dofetilide (TIKOSYN) 500 MCG capsule Take 1 capsule by mouth twice daily 180 capsule 3   glimepiride (AMARYL) 2 MG tablet Take 1 tablet by mouth daily.     GLUCOSAMINE-CHONDROITIN PO Take 1 tablet by mouth 2 (two) times daily.     hydrALAZINE (APRESOLINE) 50 MG tablet Take 50 mg by mouth 3 (three) times daily.     magnesium oxide (MAG-OX) 400 (240 Mg) MG tablet Take 1 tablet (400 mg total) by mouth 2 (two) times daily. 180 tablet 3   metFORMIN (GLUCOPHAGE) 1000 MG tablet Take 1 tablet (1,000 mg total) by mouth 2 (two) times daily.     olmesartan (BENICAR) 40 MG tablet Take 40 mg by mouth daily.     olopatadine (PATADAY) 0.1 % ophthalmic solution Place 1 drop into both eyes daily.     Omega-3 Fatty Acids (FISH OIL) 1000 MG CAPS Take 1 capsule by mouth daily.     torsemide (DEMADEX) 20 MG  tablet Take 1 tablet by mouth once daily 90 tablet 0   warfarin (COUMADIN) 5 MG tablet Take 7.5 mg by mouth daily. except Thursday 10 mg     No current facility-administered medications for this visit.    Physical Exam: Vitals:   06/07/22 0944  BP: (!) 158/60  Pulse: 62  SpO2: 97%  Weight: 189 lb 9.6 oz (86 kg)  Height: '5\' 10"'$  (1.778 m)     GEN- The patient is well appearing, alert and oriented x 3 today.   Head- normocephalic, atraumatic Eyes-  Sclera clear, conjunctiva pink Ears- hearing intact Oropharynx- clear Lungs- Clear to ausculation bilaterally, normal work of breathing Chest- ICD pocket is well healed Heart- Regular rate and rhythm, no murmurs, rubs or gallops, PMI not laterally displaced GI- soft, NT,  ND, + BS Extremities- no clubbing, cyanosis, or edema  ICD interrogation- reviewed in detail today,  See PACEART report  ekg tracing ordered today is personally reviewed and shows AV paced  Wt Readings from Last 3 Encounters:  06/07/22 189 lb 9.6 oz (86 kg)  12/08/21 189 lb (85.7 kg)  06/04/21 196 lb 3.2 oz (89 kg)    Assessment and Plan:  1.  Chronic systolic dysfunction/ CAD/ ischemic CM s/p CABG 1998 euvolemic today Stable on an appropriate medical regimen Normal ICD function with good response to CRT See Pace Art report No changes today he is not device dependant today followed in ICM device clinic   2. Persistent atrial fibrillation Maintaining sinus rhythm with tikosyn AF burden is 0 % We discussed the importance of close folloow-up while on tikosyn to avoid toxicity with this medicine Chads2vasc score is 4.  Continue coumadin Labs 5/10 /23 reviewed I will obtain dig level, bmet, mg, and cbc today  3. HTN Elevated today Home Bps show good control.   Risks, benefits and potential toxicities for medications prescribed and/or refilled reviewed with patient today.   Melida Quitter, MD  06/07/2022 10:13 AM

## 2022-06-16 DIAGNOSIS — H35033 Hypertensive retinopathy, bilateral: Secondary | ICD-10-CM | POA: Diagnosis not present

## 2022-06-16 DIAGNOSIS — E113293 Type 2 diabetes mellitus with mild nonproliferative diabetic retinopathy without macular edema, bilateral: Secondary | ICD-10-CM | POA: Diagnosis not present

## 2022-06-17 ENCOUNTER — Other Ambulatory Visit: Payer: Self-pay | Admitting: Internal Medicine

## 2022-06-20 ENCOUNTER — Ambulatory Visit (INDEPENDENT_AMBULATORY_CARE_PROVIDER_SITE_OTHER): Payer: Medicare Other

## 2022-06-20 DIAGNOSIS — I5022 Chronic systolic (congestive) heart failure: Secondary | ICD-10-CM

## 2022-06-20 DIAGNOSIS — Z95810 Presence of automatic (implantable) cardiac defibrillator: Secondary | ICD-10-CM | POA: Diagnosis not present

## 2022-06-22 NOTE — Progress Notes (Signed)
EPIC Encounter for ICM Monitoring  Patient Name: Brian Bruce is a 76 y.o. male Date: 06/22/2022 Primary Care Physican: Neale Burly, MD Primary Cardiologist: [facility] Electrophysiologist: Mealor Bi-V Pacing: 98%            11/03/2021 Weight: 188 lbs 03/02/2022 Weight: 182 lbs 06/22/2022 Weight: 184 lbs   AT/AF Burden 0%                                                          Spoke with patient and heart failure questions reviewed.  Transmission results reviewed.  Pt asymptomatic for fluid accumulation.  Reports feeling well at this time and voices no complaints.   Did not have any fluid symptoms during decreased impedance.    CorVue thoracic impedance suggesting possible fluid accumulation starting 11/16 and returning to baseline 11/19.   Prescribed: Torsemide 20 mg take 1 tablet daily.  He takes 0.5 extra tablet for fluid symptoms such as wt gain or leg swelling.   Labs: 06/07/2022 Creatinine 0.75, BUN 17, Potassium 4.1, Sodium 135, GFR >60 12/08/2021 Creatinine 0.80, BUN 8,   Potassium 4.1, Sodium 134, GFR >60 A complete set of results can be found in Results Review.   Recommendations:  No changes and encouraged to call if experiencing any fluid symptoms.   Follow-up plan: ICM clinic phone appointment on 07/26/2022.  91 day device clinic remote transmission 08/11/2022.     EP/Cardiology Office Visits:  Recall 06/07/2023 with Dr. Myles Gip.     Copy of ICM check sent to Dr Myles Gip.    3 month ICM trend: 06/20/2022.    12-14 Month ICM trend:     Rosalene Billings, RN 06/22/2022 9:15 AM

## 2022-07-14 ENCOUNTER — Ambulatory Visit: Payer: Medicare Other

## 2022-07-14 ENCOUNTER — Encounter: Payer: Self-pay | Admitting: Orthopaedic Surgery

## 2022-07-14 ENCOUNTER — Ambulatory Visit (INDEPENDENT_AMBULATORY_CARE_PROVIDER_SITE_OTHER): Payer: Medicare Other

## 2022-07-14 ENCOUNTER — Ambulatory Visit (INDEPENDENT_AMBULATORY_CARE_PROVIDER_SITE_OTHER): Payer: Medicare Other | Admitting: Orthopaedic Surgery

## 2022-07-14 VITALS — Ht 70.0 in | Wt 185.0 lb

## 2022-07-14 DIAGNOSIS — M25561 Pain in right knee: Secondary | ICD-10-CM | POA: Diagnosis not present

## 2022-07-14 DIAGNOSIS — G8929 Other chronic pain: Secondary | ICD-10-CM | POA: Diagnosis not present

## 2022-07-14 DIAGNOSIS — M25562 Pain in left knee: Secondary | ICD-10-CM

## 2022-07-14 DIAGNOSIS — M1712 Unilateral primary osteoarthritis, left knee: Secondary | ICD-10-CM | POA: Diagnosis not present

## 2022-07-14 DIAGNOSIS — I255 Ischemic cardiomyopathy: Secondary | ICD-10-CM | POA: Diagnosis not present

## 2022-07-14 DIAGNOSIS — M170 Bilateral primary osteoarthritis of knee: Secondary | ICD-10-CM | POA: Diagnosis not present

## 2022-07-14 MED ORDER — LIDOCAINE HCL 1 % IJ SOLN
0.5000 mL | INTRAMUSCULAR | Status: AC | PRN
  Administered 2022-07-14: .5 mL

## 2022-07-14 MED ORDER — BUPIVACAINE HCL 0.5 % IJ SOLN
3.0000 mL | INTRAMUSCULAR | Status: AC | PRN
  Administered 2022-07-14: 3 mL via INTRA_ARTICULAR

## 2022-07-14 MED ORDER — METHYLPREDNISOLONE ACETATE 40 MG/ML IJ SUSP
40.0000 mg | INTRAMUSCULAR | Status: AC | PRN
  Administered 2022-07-14: 40 mg via INTRA_ARTICULAR

## 2022-07-14 NOTE — Progress Notes (Signed)
Office Visit Note   Patient: Brian Bruce           Date of Birth: 02-18-1946           MRN: [id number] Visit Date: 07/14/2022              Requested by: Neale Burly, MD [place],  [place] [id number] PCP: Neale Burly, MD   Assessment & Plan: Visit Diagnoses:  1. Chronic pain of both knees   2. Unilateral primary osteoarthritis, left knee   3. Bilateral primary osteoarthritis of knee     Plan: Left knee injection performed he did not have any bleeding postinjection.  If he gets great relief he can come back after the holidays to consider right knee injection.  We discussed with his knee arthritis near bone-on-bone changes medial compartment that knee arthroplasty but would be required if his symptoms get worse.  All feels good good relief with the injection.  Follow-Up Instructions: Return if symptoms worsen or fail to improve.   Orders:  Orders Placed This Encounter  Procedures   Large Joint Inj: L knee   XR Knee 1-2 Views Right   XR Knee 1-2 Views Left   No orders of the defined types were placed in this encounter.     Procedures: Large Joint Inj: L knee on 07/14/2022 9:28 AM Indications: joint swelling and pain Details: 22 G 1.5 in needle, anterolateral approach  Arthrogram: No  Medications: 0.5 mL lidocaine 1 %; 3 mL bupivacaine 0.5 %; 40 mg methylPREDNISolone acetate 40 MG/ML Outcome: tolerated well, no immediate complications Procedure, treatment alternatives, risks and benefits explained, specific risks discussed. Consent was given by the patient. Immediately prior to procedure a time out was called to verify the correct patient, procedure, equipment, support staff and site/side marked as required. Patient was prepped and draped in the usual sterile fashion.       Clinical Data: No additional findings.   Subjective: Chief Complaint  Patient presents with   Right Knee - Pain   Left Knee - Pain    HPI 76 year old male seen with  left greater than right knee pain.  He has problems going down hills with steps and with prolonged standing and walking.  Sometimes he has some swelling in his knee mild.  He has history of heart failure coronary artery disease atrial fibs and has a pacemaker defibrillator and is on chronic Coumadin.  Patient sent here for evaluation by Dr.Hasanaj.  Review of Systems all systems updated noncontributory.   Objective: Vital Signs: Ht '5\' 10"'$  (1.778 m)   Wt 185 lb (83.9 kg)   BMI 26.54 kg/m   Physical Exam Constitutional:      Appearance: He is well-developed.  HENT:     Head: Normocephalic and atraumatic.     Right Ear: External ear normal.     Left Ear: External ear normal.  Eyes:     Pupils: Pupils are equal, round, and reactive to light.  Neck:     Thyroid: No thyromegaly.     Trachea: No tracheal deviation.  Cardiovascular:     Rate and Rhythm: Normal rate.  Pulmonary:     Effort: Pulmonary effort is normal.     Breath sounds: No wheezing.  Abdominal:     General: Bowel sounds are normal.     Palpations: Abdomen is soft.  Musculoskeletal:     Cervical back: Neck supple.  Skin:    General: Skin is warm  and dry.     Capillary Refill: Capillary refill takes less than 2 seconds.  Neurological:     Mental Status: He is alert and oriented to person, place, and time.  Psychiatric:        Behavior: Behavior normal.        Thought Content: Thought content normal.        Judgment: Judgment normal.     Ortho Exam patient has a crepitus knee range of motion worse in the left the right has full extension ambulatory with a slight left knee limp negative logroll the hips pulses are 2+ no rash over exposed skin.  No hemarthrosis.  Specialty Comments:  No specialty comments available.  Imaging: XR Knee 1-2 Views Right  Result Date: 07/14/2022 Standing AP both knees lateral right knee sunrise patella bilateral radiographs obtained and reviewed.  Left greater than right knee  osteoarthritis with medial joint line narrowing small marginal osteophytes subchondral sclerosis consistent with mild right knee osteoarthritis.  No acute changes noted. Impression: Mild right knee osteoarthritis.  XR Knee 1-2 Views Left  Result Date: 07/14/2022 Send AP both knees and left knee lateral left knee sunrise patella x-rays obtained and reviewed.  Bilateral knee osteoarthritis worse on the left than right with bone-on-bone changes medial compartment marginal osteophytes subchondral sclerosis and patellofemoral degenerative changes not as severe as medial compartment. Impression: Left knee moderate osteoarthritis.    PMFS History: Patient Active Problem List   Diagnosis Date Noted   Bilateral primary osteoarthritis of knee 07/14/2022   Persistent atrial fibrillation ([place]) 07/01/2019   Secondary hypercoagulable state ([place]) 17/51/0258   Chronic systolic dysfunction of left ventricle 52/77/8242   Chronic systolic heart failure ([place]) 03/07/2019   Coronary artery disease 03/07/2019   Past Medical History:  Diagnosis Date   Asthma    CHF (congestive heart failure) (HCC)    Coronary artery disease    Diabetes mellitus without complication (HCC)    Ischemic cardiomyopathy    LBBB (left bundle branch block)    Persistent atrial fibrillation (HCC)     Family History  Problem Relation Age of Onset   Cancer Mother    Heart failure Father     Past Surgical History:  Procedure Laterality Date   BIV ICD INSERTION CRT-D N/A 05/16/2019   Procedure: BIV ICD INSERTION CRT-D;  Surgeon: Thompson Grayer, MD;  Location: [place] [facility];  Service: Cardiovascular;  Laterality: N/A;   CORONARY ARTERY BYPASS GRAFT  1998   4-vessel   RIGHT/LEFT HEART CATH AND CORONARY/GRAFT ANGIOGRAPHY N/A 03/07/2019   Procedure: RIGHT/LEFT HEART CATH AND CORONARY/GRAFT ANGIOGRAPHY;  Surgeon: Nelva Bush, MD;  Location: [place] [facility];  Service: Cardiovascular;  Laterality: N/A;   Social History    Occupational History   Not on file  Tobacco Use   Smoking status: Former    Types: Cigarettes    Start date: 06/25/1957    Quit date: 1973    Years since quitting: 50.9   Smokeless tobacco: Never   Tobacco comments:    Former smoker 12/08/21  Vaping Use   Vaping Use: Never used  Substance and Sexual Activity   Alcohol use: Yes    Alcohol/week: 14.0 standard drinks of alcohol    Types: 14 Cans of beer per week    Comment: 2 beers daily 12/08/21   Drug use: Never   Sexual activity: Not on file

## 2022-07-26 ENCOUNTER — Ambulatory Visit (INDEPENDENT_AMBULATORY_CARE_PROVIDER_SITE_OTHER): Payer: Medicare Other

## 2022-07-26 DIAGNOSIS — Z95810 Presence of automatic (implantable) cardiac defibrillator: Secondary | ICD-10-CM

## 2022-07-26 DIAGNOSIS — I5022 Chronic systolic (congestive) heart failure: Secondary | ICD-10-CM

## 2022-07-27 NOTE — Progress Notes (Signed)
EPIC Encounter for ICM Monitoring  Patient Name: Brian Bruce is a 76 y.o. male Date: 07/27/2022 Primary Care Physican: Neale Burly, MD Electrophysiologist: Mealor Bi-V Pacing: 97%            11/03/2021 Weight: 188 lbs 03/02/2022 Weight: 182 lbs 06/22/2022 Weight: 184 lbs   AT/AF Burden 0%                                                          Spoke with patient and heart failure questions reviewed.  Transmission results reviewed.  Pt asymptomatic for fluid accumulation.  Reports feeling well at this time and voices no complaints.     CorVue thoracic impedance suggesting normal fluid levels with exception of possible fluid accumulation from 12/11-12/19.   Prescribed: Torsemide 20 mg take 1 tablet daily.  He takes 0.5 extra tablet for fluid symptoms such as wt gain or leg swelling.   Labs: 06/07/2022 Creatinine 0.75, BUN 17, Potassium 4.1, Sodium 135, GFR >60 12/08/2021 Creatinine 0.80, BUN 8,   Potassium 4.1, Sodium 134, GFR >60 A complete set of results can be found in Results Review.   Recommendations:  No changes and encouraged to call if experiencing any fluid symptoms.   Follow-up plan: ICM clinic phone appointment on 08/29/2022.  91 day device clinic remote transmission 08/11/2022.     EP/Cardiology Office Visits:  Recall 06/07/2023 with Dr. Myles Gip.     Copy of ICM check sent to Dr Myles Gip.     3 month ICM trend: 07/26/2022.    12-14 Month ICM trend:     Rosalene Billings, RN 07/27/2022 7:44 AM

## 2022-08-05 DIAGNOSIS — Z7901 Long term (current) use of anticoagulants: Secondary | ICD-10-CM | POA: Diagnosis not present

## 2022-08-07 ENCOUNTER — Other Ambulatory Visit: Payer: Self-pay | Admitting: Cardiovascular Disease

## 2022-08-08 ENCOUNTER — Other Ambulatory Visit: Payer: Self-pay | Admitting: *Deleted

## 2022-08-08 MED ORDER — DIGOXIN 125 MCG PO TABS: 125.0000 ug | ORAL_TABLET | Freq: Every day | ORAL | 3 refills | Status: DC

## 2022-08-11 ENCOUNTER — Ambulatory Visit (INDEPENDENT_AMBULATORY_CARE_PROVIDER_SITE_OTHER): Payer: Medicare Other

## 2022-08-11 DIAGNOSIS — I255 Ischemic cardiomyopathy: Secondary | ICD-10-CM

## 2022-08-11 LAB — CUP PACEART REMOTE DEVICE CHECK
Battery Remaining Longevity: 46 mo
Battery Remaining Percentage: 55 %
Battery Voltage: 2.95 V
Brady Statistic AP VP Percent: 93 %
Brady Statistic AP VS Percent: 1 %
Brady Statistic AS VP Percent: 4.1 %
Brady Statistic AS VS Percent: 2.1 %
Brady Statistic RA Percent Paced: 94 %
Date Time Interrogation Session: 20240111020014
HighPow Impedance: 60 Ohm
Implantable Lead Connection Status: 753985
Implantable Lead Connection Status: 753985
Implantable Lead Connection Status: 753985
Implantable Lead Implant Date: 20201015
Implantable Lead Implant Date: 20201015
Implantable Lead Implant Date: 20201015
Implantable Lead Location: 753858
Implantable Lead Location: 753859
Implantable Lead Location: 753860
Implantable Pulse Generator Implant Date: 20201015
Lead Channel Impedance Value: 360 Ohm
Lead Channel Impedance Value: 400 Ohm
Lead Channel Impedance Value: 490 Ohm
Lead Channel Pacing Threshold Amplitude: 0.625 V
Lead Channel Pacing Threshold Amplitude: 1 V
Lead Channel Pacing Threshold Amplitude: 1.5 V
Lead Channel Pacing Threshold Pulse Width: 0.5 ms
Lead Channel Pacing Threshold Pulse Width: 0.5 ms
Lead Channel Pacing Threshold Pulse Width: 0.8 ms
Lead Channel Sensing Intrinsic Amplitude: 12 mV
Lead Channel Sensing Intrinsic Amplitude: 2.6 mV
Lead Channel Setting Pacing Amplitude: 1.625
Lead Channel Setting Pacing Amplitude: 2 V
Lead Channel Setting Pacing Amplitude: 2 V
Lead Channel Setting Pacing Pulse Width: 0.5 ms
Lead Channel Setting Pacing Pulse Width: 0.8 ms
Lead Channel Setting Sensing Sensitivity: 0.5 mV
Pulse Gen Serial Number: 111006847
Zone Setting Status: 755011

## 2022-08-15 DIAGNOSIS — I5022 Chronic systolic (congestive) heart failure: Secondary | ICD-10-CM | POA: Diagnosis not present

## 2022-08-15 DIAGNOSIS — Z0000 Encounter for general adult medical examination without abnormal findings: Secondary | ICD-10-CM | POA: Diagnosis not present

## 2022-08-15 DIAGNOSIS — Z1331 Encounter for screening for depression: Secondary | ICD-10-CM | POA: Diagnosis not present

## 2022-08-15 DIAGNOSIS — M1712 Unilateral primary osteoarthritis, left knee: Secondary | ICD-10-CM | POA: Diagnosis not present

## 2022-08-15 DIAGNOSIS — E1159 Type 2 diabetes mellitus with other circulatory complications: Secondary | ICD-10-CM | POA: Diagnosis not present

## 2022-08-15 DIAGNOSIS — E7849 Other hyperlipidemia: Secondary | ICD-10-CM | POA: Diagnosis not present

## 2022-08-15 DIAGNOSIS — I10 Essential (primary) hypertension: Secondary | ICD-10-CM | POA: Diagnosis not present

## 2022-08-18 ENCOUNTER — Ambulatory Visit (INDEPENDENT_AMBULATORY_CARE_PROVIDER_SITE_OTHER): Payer: Medicare Other | Admitting: Orthopaedic Surgery

## 2022-08-18 VITALS — Ht 70.0 in | Wt 185.0 lb

## 2022-08-18 DIAGNOSIS — M170 Bilateral primary osteoarthritis of knee: Secondary | ICD-10-CM | POA: Diagnosis not present

## 2022-08-18 NOTE — Progress Notes (Signed)
Office Visit Note   Patient: Brian Bruce           Date of Birth: 03-14-1946           MRN: [id number] Visit Date: 08/18/2022              Requested by: Neale Burly, MD [place],  [place] [address] PCP: Neale Burly, MD   Assessment & Plan: Visit Diagnoses:  1. Bilateral primary osteoarthritis of knee     Plan: Recheck 3 months if he starts having recurrent problems with his left knee we may need to consider MRI left knee.  Recheck 3 months.  Follow-Up Instructions: Return in about 3 months (around 11/17/2022).   Orders:  No orders of the defined types were placed in this encounter.  No orders of the defined types were placed in this encounter.     Procedures: No procedures performed   Clinical Data: No additional findings.   Subjective: Chief Complaint  Patient presents with   Right Knee - Follow-up   Left Knee - Follow-up    HPI patient turns for bilateral knee osteoarthritis worse on the left than right with the right knee being mild left moderate.  He states he saw Dr. Sherrie Sport yesterday for a wellness visit had been having some recurrent problems with his left knee and Dr.Hasanaj injected his left knee and he states his left knee is doing fine currently.  He has no heater in the basement he has to back down the stairs which is usual for him he has a rail that he uses did not use to use a relatively hip 70.  Right knee is doing well and currently not bothering him.  He is using Tylenol occasionally for pain.  Additional problems include heart failure coronary artery disease atrial fibs and chronic Coumadin.  Review of Systems updated unchanged   Objective: Vital Signs: Ht '5\' 10"'$  (1.778 m)   Wt 185 lb (83.9 kg)   BMI 26.54 kg/m   Physical Exam Constitutional:      Appearance: He is well-developed.  HENT:     Head: Normocephalic and atraumatic.     Right Ear: External ear normal.     Left Ear: External ear normal.  Eyes:      Pupils: Pupils are equal, round, and reactive to light.  Neck:     Thyroid: No thyromegaly.     Trachea: No tracheal deviation.  Pulmonary:     Effort: Pulmonary effort is normal.     Breath sounds: No wheezing.  Abdominal:     General: Bowel sounds are normal.     Palpations: Abdomen is soft.  Musculoskeletal:     Cervical back: Neck supple.  Skin:    General: Skin is warm and dry.     Capillary Refill: Capillary refill takes less than 2 seconds.  Neurological:     Mental Status: He is alert and oriented to person, place, and time.  Psychiatric:        Behavior: Behavior normal.        Thought Content: Thought content normal.        Judgment: Judgment normal.     Ortho Exam good knee range of motion he is amatory without limping.  No knee effusion negative logroll to hips no rash or exposed skin.  Specialty Comments:  No specialty comments available.  Imaging: No results found.   PMFS History: Patient Active Problem List   Diagnosis Date  Noted   Bilateral primary osteoarthritis of knee 07/14/2022   Persistent atrial fibrillation ([facility]) 07/01/2019   Secondary hypercoagulable state ([facility]) 68/34/1962   Chronic systolic dysfunction of left ventricle 22/97/9892   Chronic systolic heart failure ([place]) 03/07/2019   Coronary artery disease 03/07/2019   Past Medical History:  Diagnosis Date   Asthma    CHF (congestive heart failure) (HCC)    Coronary artery disease    Diabetes mellitus without complication (HCC)    Ischemic cardiomyopathy    LBBB (left bundle branch block)    Persistent atrial fibrillation (HCC)     Family History  Problem Relation Age of Onset   Cancer Mother    Heart failure Father     Past Surgical History:  Procedure Laterality Date   BIV ICD INSERTION CRT-D N/A 05/16/2019   Procedure: BIV ICD INSERTION CRT-D;  Surgeon: Thompson Grayer, MD;  Location: [facility];  Service: Cardiovascular;  Laterality: N/A;   CORONARY ARTERY BYPASS GRAFT   1998   4-vessel   RIGHT/LEFT HEART CATH AND CORONARY/GRAFT ANGIOGRAPHY N/A 03/07/2019   Procedure: RIGHT/LEFT HEART CATH AND CORONARY/GRAFT ANGIOGRAPHY;  Surgeon: Nelva Bush, MD;  Location: [facility];  Service: Cardiovascular;  Laterality: N/A;   Social History   Occupational History   Not on file  Tobacco Use   Smoking status: Former    Types: Cigarettes    Start date: 06/25/1957    Quit date: 1973    Years since quitting: 51.0   Smokeless tobacco: Never   Tobacco comments:    Former smoker 12/08/21  Vaping Use   Vaping Use: Never used  Substance and Sexual Activity   Alcohol use: Yes    Alcohol/week: 14.0 standard drinks of alcohol    Types: 14 Cans of beer per week    Comment: 2 beers daily 12/08/21   Drug use: Never   Sexual activity: Not on file

## 2022-08-19 ENCOUNTER — Other Ambulatory Visit: Payer: Self-pay | Admitting: Cardiovascular Disease

## 2022-08-29 ENCOUNTER — Ambulatory Visit: Payer: Medicare Other | Attending: Cardiovascular Disease

## 2022-08-29 DIAGNOSIS — I5022 Chronic systolic (congestive) heart failure: Secondary | ICD-10-CM

## 2022-08-29 DIAGNOSIS — Z95810 Presence of automatic (implantable) cardiac defibrillator: Secondary | ICD-10-CM

## 2022-08-31 NOTE — Progress Notes (Signed)
Remote ICD transmission.   

## 2022-08-31 NOTE — Progress Notes (Signed)
EPIC Encounter for ICM Monitoring  Patient Name: Brian Bruce is a 77 y.o. male Date: 08/31/2022 Primary Care Physican: Neale Burly, MD Primary Cardiologist: [facility] Electrophysiologist: Meal[facility] Bi-V Pacing: 96%            11/03/2021 Weight: 188 lbs 03/02/2022 Weight: 182 lbs 06/22/2022 Weight: 184 lbs 08/31/2022 Weight: 183 lbs   AT/AF Burden 0% (taking Warfarin)                                                          Spoke with patient and heart failure questions reviewed.  Transmission results reviewed.  Pt asymptomatic for fluid accumulation.  Reports feeling well at this time and voices no complaints.     CorVue thoracic impedance suggesting normal fluid levels with exception of possible fluid accumulation from 1/10-1/15 and 1/16-1/21.   Prescribed: Torsemide 20 mg take 1 tablet daily.  He takes 0.5 extra tablet for fluid symptoms such as wt gain or leg swelling.   Labs: 06/07/2022 Creatinine 0.75, BUN 17, Potassium 4.1, Sodium 135, GFR >60 12/08/2021 Creatinine 0.80, BUN 8,   Potassium 4.1, Sodium 134, GFR >60 A complete set of results can be found in Results Review.   Recommendations:  No changes and encouraged to call if experiencing any fluid symptoms.   Follow-up plan: ICM clinic phone appointment on 10/03/2022.  91 day device clinic remote transmission 11/10/2022.     EP/Cardiology Office Visits:  Recall 06/07/2023 with Dr. Myles Gip.     Copy of ICM check sent to Dr Myles Gip.     3 month ICM trend: 08/29/2022.    12-14 Month ICM trend:     Rosalene Billings, RN 08/31/2022 8:59 AM

## 2022-09-08 ENCOUNTER — Encounter (HOSPITAL_COMMUNITY): Payer: Self-pay | Admitting: *Deleted

## 2022-09-09 DIAGNOSIS — Z7901 Long term (current) use of anticoagulants: Secondary | ICD-10-CM | POA: Diagnosis not present

## 2022-09-09 DIAGNOSIS — Z79899 Other long term (current) drug therapy: Secondary | ICD-10-CM | POA: Diagnosis not present

## 2022-09-28 DIAGNOSIS — I4811 Longstanding persistent atrial fibrillation: Secondary | ICD-10-CM | POA: Diagnosis not present

## 2022-09-28 DIAGNOSIS — I10 Essential (primary) hypertension: Secondary | ICD-10-CM | POA: Diagnosis not present

## 2022-09-28 DIAGNOSIS — I5022 Chronic systolic (congestive) heart failure: Secondary | ICD-10-CM | POA: Diagnosis not present

## 2022-09-28 DIAGNOSIS — I2510 Atherosclerotic heart disease of native coronary artery without angina pectoris: Secondary | ICD-10-CM | POA: Diagnosis not present

## 2022-10-03 ENCOUNTER — Ambulatory Visit: Payer: Medicare Other | Attending: Cardiovascular Disease

## 2022-10-03 DIAGNOSIS — I5022 Chronic systolic (congestive) heart failure: Secondary | ICD-10-CM

## 2022-10-03 DIAGNOSIS — Z95810 Presence of automatic (implantable) cardiac defibrillator: Secondary | ICD-10-CM | POA: Diagnosis not present

## 2022-10-03 NOTE — Progress Notes (Signed)
EPIC Encounter for ICM Monitoring  Patient Name: Brian Bruce is a 77 y.o. male Date: 10/03/2022 Primary Care Physican: Neale Burly, MD Primary Cardiologist: [facility] Electrophysiologist: Mealor Bi-V Pacing: 95%            11/03/2021 Weight: 188 lbs 03/02/2022 Weight: 182 lbs 06/22/2022 Weight: 184 lbs 08/31/2022 Weight: 183 lbs 10/03/2022 Weight: 183 lbs   AT/AF Burden 0% (taking Warfarin)                                                          Spoke with patient and heart failure questions reviewed.  Transmission results reviewed.  Pt has a little ankle swelling after eating pizza on Saturday and other wise without complaint.     CorVue thoracic impedance suggesting possible fluid accumulation starting 3/3.  Also decreased impedance from 2/7-2/15 and 2/26-3/1.   Prescribed: Torsemide 20 mg take 1 tablet daily.  He takes 0.5 extra tablet for fluid symptoms such as wt gain or leg swelling.   Labs: 06/07/2022 Creatinine 0.75, BUN 17, Potassium 4.1, Sodium 135, GFR >60 12/08/2021 Creatinine 0.80, BUN 8,   Potassium 4.1, Sodium 134, GFR >60 A complete set of results can be found in Results Review.   Recommendations:  Advised to take extra 0.5 Torsemide tablet if ankle swelling does not resolve overnight.    Follow-up plan: ICM clinic phone appointment on 11/07/2022.  91 day device clinic remote transmission 11/10/2022.     EP/Cardiology Office Visits:  Recall 06/07/2023 with Dr. Myles Gip.     Copy of ICM check sent to Dr. Myles Gip.   3 month ICM trend: 10/03/2022.    12-14 Month ICM trend:     Rosalene Billings, RN 10/03/2022 9:53 AM

## 2022-10-07 DIAGNOSIS — Z7901 Long term (current) use of anticoagulants: Secondary | ICD-10-CM | POA: Diagnosis not present

## 2022-10-10 ENCOUNTER — Other Ambulatory Visit: Payer: Self-pay | Admitting: *Deleted

## 2022-10-10 MED ORDER — DOFETILIDE 500 MCG PO CAPS: 500.0000 ug | ORAL_CAPSULE | Freq: Two times a day (BID) | ORAL | 3 refills | Status: DC

## 2022-10-21 DIAGNOSIS — Z7901 Long term (current) use of anticoagulants: Secondary | ICD-10-CM | POA: Diagnosis not present

## 2022-11-10 ENCOUNTER — Ambulatory Visit (INDEPENDENT_AMBULATORY_CARE_PROVIDER_SITE_OTHER): Payer: Medicare Other | Admitting: Orthopaedic Surgery

## 2022-11-10 ENCOUNTER — Ambulatory Visit (INDEPENDENT_AMBULATORY_CARE_PROVIDER_SITE_OTHER): Payer: Medicare Other

## 2022-11-10 ENCOUNTER — Encounter: Payer: Self-pay | Admitting: Orthopaedic Surgery

## 2022-11-10 VITALS — Ht 70.0 in | Wt 182.0 lb

## 2022-11-10 DIAGNOSIS — I255 Ischemic cardiomyopathy: Secondary | ICD-10-CM

## 2022-11-10 DIAGNOSIS — M170 Bilateral primary osteoarthritis of knee: Secondary | ICD-10-CM | POA: Diagnosis not present

## 2022-11-10 NOTE — Progress Notes (Signed)
No ICM remote transmission received for 11/07/2022 and next ICM transmission scheduled for 11/21/2022.   

## 2022-11-10 NOTE — Progress Notes (Signed)
Office Visit Note   Patient: Brian Bruce           Date of Birth: 1946/02/27           MRN: [id number] Visit Date: 11/10/2022              Requested by: Toma Deiters, MD [address] [place],  [place] [address] PCP: Toma Deiters, MD   Assessment & Plan: Visit Diagnoses:  1. Bilateral primary osteoarthritis of knee     Plan: Patient with bone-on-bone changes left knee marginal osteophytes subchondral sclerosis tricompartmental degenerative changes.  He has failed conservative treatment including injections he cannot take anti-inflammatories since he is on chronic Coumadin.  We discussed total knee arthroplasty.  He will need to see the cardiologist to make a determination whether his anticoagulant needs to be bridged or not bridged.  Coumadin would be restarted after his procedure.  We discussed home physical therapy outpatient therapy and importance of participation.  He has 2 sons to be available after the surgery for help.  Questions were elicited and answered he understands question proceed he will need cardiology clearance.  Plan would be left total knee arthroplasty under spinal anesthesia if his pro time at the time of admission is satisfactory if not it would be general anesthesia.  Follow-Up Instructions: No follow-ups on file.   Orders:  No orders of the defined types were placed in this encounter.  No orders of the defined types were placed in this encounter.     Procedures: No procedures performed   Clinical Data: No additional findings.   Subjective: Chief Complaint  Patient presents with   Left Knee - Follow-up, Pain    HPI 77 year old male returns with bilateral knee osteoarthritis bone-on-bone changes with worse pain left knee than right knee.  He states injections no longer work to give him any pain relief.  Last injection 08/17/2022 gave him 2 days relief.  He is having trouble walking he used a cane his knee pops at times he cannot turn and  rotate.  He has swelling at the end of the day.  Patient does have diabetes and hypertension history of systolic failure.  Patient has defibrillator left chest wall and is on chronic Coumadin for his atrial fibrillation and pacemaker.  No history of CVA.  He is followed by [facility]  Review of Systems positive chronic Coumadin history of heart failure, pacemaker defibrillator, coronary artery disease, hypertension.  All systems noncontributory to HPI.   Objective: Vital Signs: Ht 5\' 10"  (1.778 m)   Wt 182 lb (82.6 kg)   BMI 26.11 kg/m   Physical Exam Constitutional:      Appearance: He is well-developed.  HENT:     Head: Normocephalic and atraumatic.     Right Ear: External ear normal.     Left Ear: External ear normal.  Eyes:     Pupils: Pupils are equal, round, and reactive to light.  Neck:     Thyroid: No thyromegaly.     Trachea: No tracheal deviation.  Cardiovascular:     Rate and Rhythm: Normal rate.  Pulmonary:     Effort: Pulmonary effort is normal.     Breath sounds: No wheezing.  Abdominal:     General: Bowel sounds are normal.     Palpations: Abdomen is soft.  Musculoskeletal:     Cervical back: Neck supple.  Skin:    General: Skin is warm and dry.     Capillary  Refill: Capillary refill takes less than 2 seconds.  Neurological:     Mental Status: He is alert and oriented to person, place, and time.  Psychiatric:        Behavior: Behavior normal.        Thought Content: Thought content normal.        Judgment: Judgment normal.     Ortho Exam crepitus with both knee range of motion.  Knees, then 5 degrees full extension flexion 110 degrees.  Crepitus both knees range of motion mild varus more medial and lateral joint line tenderness and palpable osteophytes.  Negative logroll to hips distal pulses palpable.  Specialty Comments:  No specialty comments available.  Imaging: No results found.   PMFS History: Patient Active Problem List   Diagnosis  Date Noted   Bilateral primary osteoarthritis of knee 07/14/2022   Persistent atrial fibrillation 07/01/2019   Secondary hypercoagulable state 07/01/2019   Chronic systolic dysfunction of left ventricle 05/16/2019   Chronic systolic heart failure 03/07/2019   Coronary artery disease 03/07/2019   Past Medical History:  Diagnosis Date   Asthma    CHF (congestive heart failure)    Coronary artery disease    Diabetes mellitus without complication    Ischemic cardiomyopathy    LBBB (left bundle branch block)    Persistent atrial fibrillation     Family History  Problem Relation Age of Onset   Cancer Mother    Heart failure Father     Past Surgical History:  Procedure Laterality Date   BIV ICD INSERTION CRT-D N/A 05/16/2019   Procedure: BIV ICD INSERTION CRT-D;  Surgeon: Hillis Range, MD;  Location: [facility];  Service: Cardiovascular;  Laterality: N/A;   CORONARY ARTERY BYPASS GRAFT  1998   4-vessel   RIGHT/LEFT HEART CATH AND CORONARY/GRAFT ANGIOGRAPHY N/A 03/07/2019   Procedure: RIGHT/LEFT HEART CATH AND CORONARY/GRAFT ANGIOGRAPHY;  Surgeon: Yvonne Kendall, MD;  Location: [facility];  Service: Cardiovascular;  Laterality: N/A;   Social History   Occupational History   Not on file  Tobacco Use   Smoking status: Former    Types: Cigarettes    Start date: 06/25/1957    Quit date: 1973    Years since quitting: 51.3   Smokeless tobacco: Never   Tobacco comments:    Former smoker 12/08/21  Vaping Use   Vaping Use: Never used  Substance and Sexual Activity   Alcohol use: Yes    Alcohol/week: 14.0 standard drinks of alcohol    Types: 14 Cans of beer per week    Comment: 2 beers daily 12/08/21   Drug use: Never   Sexual activity: Not on file

## 2022-11-11 DIAGNOSIS — Z7901 Long term (current) use of anticoagulants: Secondary | ICD-10-CM | POA: Diagnosis not present

## 2022-11-14 ENCOUNTER — Telehealth: Payer: Self-pay

## 2022-11-14 NOTE — Telephone Encounter (Signed)
   Pre-operative Risk Assessment    Patient Name: Brian Bruce  DOB: 09-16-1945 MRN: [id number]     Request for Surgical Clearance    Procedure:  L total knee arthoplasty   Date of Surgery:  Clearance TBD                                 Surgeon:  Dr. Annell Greening  Surgeon's Group or Practice Name:  [facility] Phone number:  [phone] Fax number:  [phone]   Type of Clearance Requested:   - Pharmacy:  Hold Warfarin (Coumadin)     Type of Anesthesia:  General    Additional requests/questions:    Scarlette Shorts   11/14/2022, 11:09 AM

## 2022-11-14 NOTE — Telephone Encounter (Signed)
Patient with diagnosis of afib on warfarin for anticoagulation.    Procedure: left total knee arthroplasty Date of procedure: TBD  CHA2DS2-VASc Score = 6  This indicates a 9.7% annual risk of stroke. The patient's score is based upon: CHF History: 1 HTN History: 1 Diabetes History: 1 Stroke History: 0 Vascular Disease History: 1 Age Score: 2 Gender Score: 0  CrCl 52mL/min using adj body weight Platelet count 292K  Per office protocol, patient can hold warfarin for 5 days prior to procedure. Patient will not need bridging with Lovenox (enoxaparin) around procedure. Primary care is monitoring his INRs.  **This guidance is not considered finalized until pre-operative APP has relayed final recommendations.**

## 2022-11-14 NOTE — Telephone Encounter (Signed)
I will route to requesting provider's office to make then aware of updates for pre-op clearance.

## 2022-11-15 DIAGNOSIS — M1712 Unilateral primary osteoarthritis, left knee: Secondary | ICD-10-CM | POA: Diagnosis not present

## 2022-11-15 DIAGNOSIS — I10 Essential (primary) hypertension: Secondary | ICD-10-CM | POA: Diagnosis not present

## 2022-11-15 DIAGNOSIS — E1159 Type 2 diabetes mellitus with other circulatory complications: Secondary | ICD-10-CM | POA: Diagnosis not present

## 2022-11-15 DIAGNOSIS — E7849 Other hyperlipidemia: Secondary | ICD-10-CM | POA: Diagnosis not present

## 2022-11-15 DIAGNOSIS — I5022 Chronic systolic (congestive) heart failure: Secondary | ICD-10-CM | POA: Diagnosis not present

## 2022-11-15 DIAGNOSIS — Z125 Encounter for screening for malignant neoplasm of prostate: Secondary | ICD-10-CM | POA: Diagnosis not present

## 2022-11-23 ENCOUNTER — Telehealth: Payer: Self-pay

## 2022-11-23 ENCOUNTER — Ambulatory Visit: Payer: Medicare Other | Attending: Cardiovascular Disease

## 2022-11-23 DIAGNOSIS — I5022 Chronic systolic (congestive) heart failure: Secondary | ICD-10-CM | POA: Diagnosis not present

## 2022-11-23 DIAGNOSIS — Z95810 Presence of automatic (implantable) cardiac defibrillator: Secondary | ICD-10-CM | POA: Diagnosis not present

## 2022-11-23 NOTE — Telephone Encounter (Signed)
Attempted patient call to request remote transmission.  Left message that mobile app is not connecting with his device and to make sure the phone is charged and the app remains open.  Requested to send manual transmission and to call back if assistance is needed.

## 2022-11-24 LAB — CUP PACEART REMOTE DEVICE CHECK
Battery Remaining Longevity: 44 mo
Battery Remaining Percentage: 53 %
Battery Voltage: 2.93 V
Brady Statistic AP VP Percent: 91 %
Brady Statistic AP VS Percent: 1 %
Brady Statistic AS VP Percent: 4 %
Brady Statistic AS VS Percent: 3.9 %
Brady Statistic RA Percent Paced: 92 %
Date Time Interrogation Session: 20240408020022
HighPow Impedance: 52 Ohm
Implantable Lead Connection Status: 753985
Implantable Lead Connection Status: 753985
Implantable Lead Connection Status: 753985
Implantable Lead Implant Date: 20201015
Implantable Lead Implant Date: 20201015
Implantable Lead Implant Date: 20201015
Implantable Lead Location: 753858
Implantable Lead Location: 753859
Implantable Lead Location: 753860
Implantable Pulse Generator Implant Date: 20201015
Lead Channel Impedance Value: 380 Ohm
Lead Channel Impedance Value: 390 Ohm
Lead Channel Impedance Value: 490 Ohm
Lead Channel Pacing Threshold Amplitude: 0.75 V
Lead Channel Pacing Threshold Amplitude: 1 V
Lead Channel Pacing Threshold Amplitude: 1.75 V
Lead Channel Pacing Threshold Pulse Width: 0.5 ms
Lead Channel Pacing Threshold Pulse Width: 0.5 ms
Lead Channel Pacing Threshold Pulse Width: 0.8 ms
Lead Channel Sensing Intrinsic Amplitude: 12 mV
Lead Channel Sensing Intrinsic Amplitude: 2.8 mV
Lead Channel Setting Pacing Amplitude: 1.75 V
Lead Channel Setting Pacing Amplitude: 2 V
Lead Channel Setting Pacing Amplitude: 2.25 V
Lead Channel Setting Pacing Pulse Width: 0.5 ms
Lead Channel Setting Pacing Pulse Width: 0.8 ms
Lead Channel Setting Sensing Sensitivity: 0.5 mV
Pulse Gen Serial Number: 111006847
Zone Setting Status: 755011

## 2022-11-25 NOTE — Progress Notes (Signed)
EPIC Encounter for ICM Monitoring  Patient Name: Brian Bruce is a 77 y.o. male Date: 11/25/2022 Primary Care Physican: Toma Deiters, MD Primary Cardiologist: [facility] Electrophysiologist: Mealor Bi-V Pacing: 96%            11/03/2021 Weight: 188 lbs 03/02/2022 Weight: 182 lbs 06/22/2022 Weight: 184 lbs 08/31/2022 Weight: 183 lbs 10/03/2022 Weight: 183 lbs 11/25/2022 Weight: 183 lbs   AT/AF Burden 0% (taking Warfarin)                                                          Spoke with patient and heart failure questions reviewed.  Transmission results reviewed.  Pt asymptomatic for fluid accumulation.  Reports feeling well at this time and voices no complaints.      CorVue thoracic impedance suggesting normal fluid levels with the exception of possible fluid accumulation from 3/25-4/8.   Prescribed: Torsemide 20 mg take 1 tablet daily.  He takes 0.5 extra tablet for fluid symptoms such as wt gain or leg swelling.   Labs: 06/07/2022 Creatinine 0.75, BUN 17, Potassium 4.1, Sodium 135, GFR >60 12/08/2021 Creatinine 0.80, BUN 8,   Potassium 4.1, Sodium 134, GFR >60 A complete set of results can be found in Results Review.   Recommendations:  No changes and encouraged to call if experiencing any fluid symptoms.   Follow-up plan: ICM clinic phone appointment on 12/27/2022.  91 day device clinic remote transmission 02/09/2023.     EP/Cardiology Office Visits:  Recall 06/07/2023 with Dr. Nelly Laurence.     Copy of ICM check sent to Dr. Nelly Laurence.   3 month ICM trend: 11/24/2022.    12-14 Month ICM trend:     Karie Soda, RN 11/25/2022 9:26 AM

## 2022-12-01 ENCOUNTER — Telehealth: Payer: Self-pay

## 2022-12-01 NOTE — Telephone Encounter (Signed)
   Pre-operative Risk Assessment    Patient Name: Brian Bruce  DOB: 1945-08-06 MRN: [id number]      Request for Surgical Clearance    Procedure:   Left Total Knee Arthroplasty  Date of Surgery:  Clearance TBD                                 Surgeon:  Dr. Annell Greening Surgeon's Group or Practice Name:  [facility] Phone number:  [phone] Fax number:  [phone] ATTN [name]   Type of Clearance Requested:   - Medical  - Pharmacy:  Hold Aspirin and Warfarin (Coumadin) Pt will need instructions on when/if to hold    Type of Anesthesia:  General    Additional requests/questions:    [name]   12/01/2022, 2:28 PM

## 2022-12-02 DIAGNOSIS — Z7901 Long term (current) use of anticoagulants: Secondary | ICD-10-CM | POA: Diagnosis not present

## 2022-12-02 NOTE — Telephone Encounter (Signed)
See request dated 4/15.

## 2022-12-02 NOTE — Telephone Encounter (Signed)
Preoperative team, patient is receiving cardiology recommendations from [facility] cardiology.  Preoperative cardiac clearance should come from [facility] cardiology.  Thank you.  Thomasene Ripple. Jalonda Antigua NP-C     12/02/2022, 12:11 PM [facility] [address] Office [phone] Fax [phone]

## 2022-12-06 ENCOUNTER — Ambulatory Visit (HOSPITAL_COMMUNITY)
Admission: RE | Admit: 2022-12-06 | Discharge: 2022-12-06 | Disposition: A | Discharge status: Home / Self Care | Payer: Medicare Other | Source: Ambulatory Visit | Attending: Physician Assistant | Admitting: Physician Assistant

## 2022-12-06 VITALS — BP 170/66 | HR 63 | Ht 70.0 in | Wt 189.2 lb

## 2022-12-06 DIAGNOSIS — Z7901 Long term (current) use of anticoagulants: Secondary | ICD-10-CM | POA: Diagnosis not present

## 2022-12-06 DIAGNOSIS — I5022 Chronic systolic (congestive) heart failure: Secondary | ICD-10-CM | POA: Insufficient documentation

## 2022-12-06 DIAGNOSIS — Z5181 Encounter for therapeutic drug level monitoring: Secondary | ICD-10-CM | POA: Diagnosis not present

## 2022-12-06 DIAGNOSIS — Z951 Presence of aortocoronary bypass graft: Secondary | ICD-10-CM | POA: Insufficient documentation

## 2022-12-06 DIAGNOSIS — I2510 Atherosclerotic heart disease of native coronary artery without angina pectoris: Secondary | ICD-10-CM | POA: Insufficient documentation

## 2022-12-06 DIAGNOSIS — D6869 Other thrombophilia: Secondary | ICD-10-CM

## 2022-12-06 DIAGNOSIS — Z79899 Other long term (current) drug therapy: Secondary | ICD-10-CM | POA: Diagnosis not present

## 2022-12-06 DIAGNOSIS — I4819 Other persistent atrial fibrillation: Secondary | ICD-10-CM | POA: Diagnosis not present

## 2022-12-06 DIAGNOSIS — I110 Hypertensive heart disease with heart failure: Secondary | ICD-10-CM | POA: Diagnosis not present

## 2022-12-06 LAB — BASIC METABOLIC PANEL
Anion gap: 15 (ref 5–15)
BUN: 9 mg/dL (ref 8–23)
CO2: 18 mmol/L — ABNORMAL LOW (ref 22–32)
Calcium: 8.6 mg/dL — ABNORMAL LOW (ref 8.9–10.3)
Chloride: 101 mmol/L (ref 98–111)
Creatinine, Ser: 0.72 mg/dL (ref 0.61–1.24)
GFR, Estimated: >60 mL/min (ref >60)
Glucose, Bld: 143 mg/dL — ABNORMAL HIGH (ref 70–99)
Potassium: 4.1 mmol/L (ref 3.5–5.1)
Sodium: 134 mmol/L — ABNORMAL LOW (ref 135–145)

## 2022-12-06 LAB — MAGNESIUM: Magnesium: 2 mg/dL (ref 1.7–2.4)

## 2022-12-06 NOTE — Progress Notes (Signed)
Primary Care Physician: Dr Lia Hopping Primary Cardiologist: Dr Sharrell Ku Primary Electrophysiologist: Dr Nelly Laurence  Referring Physician: Francis Dowse PA   Brian Bruce is a 77 y.o. male with a history of CAD (CABG 1998), DM, persistent Afib, ICM s/p ICD, chronic CHF (systolic), LBBB who presents for follow up in the [facility]. Patient seen in EP clinic post ICD implant with persistent afib. He is on warfarin for a CHADS2VASC score of 4. Patient would like to pursue Tikosyn. He is fairly unaware of his arrhythmia with no increased SOB, fatigue, palpitations, or heart racing. He denies significant snoring or alcohol use. Patient is s/p dofetilide loading 1/5-08/09/19. He converted to SR on the medication and did not require DCCV.   On follow up today, patient reports that he has done well from an afib standpoint. He denies any bleeding issues on anticoagulation. His device shows 0% afib burden. He does state that his BP has been more elevated at home recently.   Today, he denies symptoms of palpitations, chest pain, shortness of breath, orthopnea, PND, lower extremity edema, dizziness, presyncope, syncope, snoring, daytime somnolence, bleeding, or neurologic sequela. The patient is tolerating medications without difficulties and is otherwise without complaint today.    Atrial Fibrillation Risk Factors:  he does not have symptoms or diagnosis of sleep apnea. he does not have a history of rheumatic fever.   he has a BMI of Body mass index is 27.15 kg/m.Marland Kitchen Filed Weights   12/06/22 0856  Weight: 85.8 kg   Family History  Problem Relation Age of Onset   Cancer Mother    Heart failure Father      Atrial Fibrillation Management history:  Previous antiarrhythmic drugs: dofetilide Previous cardioversions: none Previous ablations: none Anticoagulation history: warfarin    Past Medical History:  Diagnosis Date   Asthma    CHF (congestive heart failure) (HCC)     Coronary artery disease    Diabetes mellitus without complication (HCC)    Ischemic cardiomyopathy    LBBB (left bundle branch block)    Persistent atrial fibrillation (HCC)    Past Surgical History:  Procedure Laterality Date   BIV ICD INSERTION CRT-D N/A 05/16/2019   Procedure: BIV ICD INSERTION CRT-D;  Surgeon: Hillis Range, MD;  Location: [facility] CV LAB;  Service: Cardiovascular;  Laterality: N/A;   CORONARY ARTERY BYPASS GRAFT  1998   4-vessel   RIGHT/LEFT HEART CATH AND CORONARY/GRAFT ANGIOGRAPHY N/A 03/07/2019   Procedure: RIGHT/LEFT HEART CATH AND CORONARY/GRAFT ANGIOGRAPHY;  Surgeon: Yvonne Kendall, MD;  Location: [facility] INVAS[facility] CV LAB;  Service: Cardiovascular;  Laterality: N/A;    Current Outpatient Medications  Medication Sig Dispense Refill   acetaminophen (TYLENOL) 500 MG tablet Take 500 mg by mouth every 6 (six) hours as needed for moderate pain.     amLODipine (NORVASC) 10 MG tablet Take 1 tablet by mouth daily.     aspirin EC 81 MG tablet Take 81 mg by mouth daily.     atorvastatin (LIPITOR) 80 MG tablet Take 80 mg by mouth every evening.     augmented betamethasone dipropionate (DIPROLENE-AF) 0.05 % cream Apply 1 application topically 2 (two) times daily as needed (rash).     carvedilol (COREG) 25 MG tablet Take 1 tablet by mouth 2 (two) times daily.     Cholecalciferol (VITAMIN D) 50 MCG (2000 UT) tablet Take 1,000 Units by mouth daily.     Coenzyme Q10 (CO Q-10) 100 MG CAPS Take 100 mg  by mouth daily.     diclofenac Sodium (VOLTAREN) 1 % GEL Apply 2 g topically 4 (four) times daily.     digoxin (LANOXIN) 0.125 MG tablet Take 1 tablet (125 mcg total) by mouth daily. 90 tablet 3   dofetilide (TIKOSYN) 500 MCG capsule Take 1 capsule (500 mcg total) by mouth 2 (two) times daily. 180 capsule 3   glimepiride (AMARYL) 2 MG tablet Take 1 tablet by mouth daily.     GLUCOSAMINE-CHONDROITIN PO Take 1 tablet by mouth 2 (two) times daily.     hydrALAZINE (APRESOLINE) 50 MG  tablet Take 50 mg by mouth 3 (three) times daily.     MAGnesium-Oxide 400 (240 Mg) MG tablet Take 1 tablet by mouth twice daily 180 tablet 3   metFORMIN (GLUCOPHAGE) 1000 MG tablet Take 1 tablet (1,000 mg total) by mouth 2 (two) times daily.     olmesartan (BENICAR) 40 MG tablet Take 40 mg by mouth daily.     olopatadine (PATADAY) 0.1 % ophthalmic solution Place 1 drop into both eyes daily.     Omega-3 Fatty Acids (FISH OIL) 1000 MG CAPS Take 2 capsules by mouth daily.     torsemide (DEMADEX) 20 MG tablet Take 1 tablet by mouth once daily 90 tablet 1   warfarin (COUMADIN) 5 MG tablet Take 7.5 mg by mouth daily. except Thursday 10 mg     No current facility-administered medications for this encounter.    No Known Allergies  Social History   Socioeconomic History   Marital status: Widowed    Spouse name: Not on file   Number of children: Not on file   Years of education: Not on file   Highest education level: Not on file  Occupational History   Not on file  Tobacco Use   Smoking status: Former    Types: Cigarettes    Start date: 06/25/1957    Quit date: 1973    Years since quitting: 51.3   Smokeless tobacco: Never   Tobacco comments:    Former smoker 12/08/21  Vaping Use   Vaping Use: Never used  Substance and Sexual Activity   Alcohol use: Yes    Alcohol/week: 14.0 standard drinks of alcohol    Types: 14 Cans of beer per week    Comment: 2 beers daily 12/08/21   Drug use: Never   Sexual activity: Not on file  Other Topics Concern   Not on file  Social History Narrative   Lives in [place] with spouse.  Retired.   Previously worked as a Catering manager: Not on [employer] Insecurity: Not on file  Transportation Needs: Not on file  Physical Activity: Not on file  Stress: Not on file  Social Connections: Not on file  Intimate Partner Violence: Not on file     ROS- All systems are reviewed and  negative except as per the HPI above.  Physical Exam: Vitals:   12/06/22 0856  BP: (!) 170/66  Pulse: 63  Weight: 85.8 kg  Height: 5\' 10"  (1.778 m)     GEN- The patient is a well appearing elderly male, alert and oriented x 3 today.   HEENT-head normocephalic, atraumatic, sclera clear, conjunctiva pink, hearing intact, trachea midline. Lungs- Clear to ausculation bilaterally, normal work of breathing Heart- Regular rate and rhythm, no murmurs, rubs or gallops  GI- soft, NT, ND, + BS Extremities- no clubbing, cyanosis, or edema MS- no  significant deformity or atrophy Skin- no rash or lesion Psych- euthymic mood, full affect Neuro- strength and sensation are intact   Wt Readings from Last 3 Encounters:  12/06/22 85.8 kg  11/10/22 82.6 kg  08/18/22 83.9 kg    EKG today demonstrates AV dual pacing Vent. rate 63 BPM PR interval 182 ms QRS duration 164 ms QT/QTcB 512/523 ms (480 ms when corrected for V pacing)  Echo 12/27/19 demonstrated [facility])  1. The left ventricle is normal in size with mildly increased wall  thickness.    2. The left ventricular systolic function is overall normal, LVEF is  visually estimated at 50-55%.    3. There is grade I diastolic dysfunction (impaired relaxation).    4. There is moderate aortic valve stenosis.    5. The left atrium is moderately dilated in size.    6. The right ventricle is normal in size, with normal systolic function.    7. The right atrium is moderately dilated  in size.    8. Overall left ventricular systolic function has normalized in comparison  to the study dated 03/2019.    Epic records are reviewed at length today   CHA2DS2-VASc Score = 6  The patient's score is based upon: CHF History: 1 HTN History: 1 Diabetes History: 1 Stroke History: 0 Vascular Disease History: 1 Age Score: 2 Gender Score: 0       ASSESSMENT AND PLAN: 1. Persistent Atrial Fibrillation (ICD10:  I48.19) The patient's CHA2DS2-VASc score  is 6, indicating a 9.7% annual risk of stroke.   Patient is s/p dofetilide loading 1/5-08/09/19. His device continues to show 0% afib burden. Continue dofetilide 500 mcg BID. QT stable. Check bmet/mag today. Continue carvedilol 25 mg BID Continue digoxin 0.125 mg daily Continue warfarin  2. Secondary Hypercoagulable State (ICD10:  D68.69) The patient is at significant risk for stroke/thromboembolism based upon his CHA2DS2-VASc Score of 6.  Continue Warfarin (Coumadin).   3. CAD S/p CABG 1998 No anginal symptoms.  4. ICM/Chronic systolic CHF S/p ICD, followed by Dr Johney Frame and the device clinic. CorVue 11/23/22 suggested normal fluid levels. Appears euvolemic today.  5. HTN Elevated today and at home recently. He is on amlodipine, carvedilol, hydralazine, and olmesartan. Offered referral to HTN clinic. He plans to reach out to his primary cardiologist Dr Sharrell Ku to discuss.    Follow up with Dr Nelly Laurence per recall. AF clinic in one year.    Jorja Loa PA-C Afib Clinic [facility] [address] [place], [place] [address] [phone] 12/06/2022 9:47 AM

## 2022-12-14 DIAGNOSIS — M810 Age-related osteoporosis without current pathological fracture: Secondary | ICD-10-CM | POA: Diagnosis not present

## 2022-12-14 DIAGNOSIS — Z7952 Long term (current) use of systemic steroids: Secondary | ICD-10-CM | POA: Diagnosis not present

## 2022-12-15 NOTE — Progress Notes (Signed)
Remote ICD transmission.   

## 2022-12-23 DIAGNOSIS — Z7901 Long term (current) use of anticoagulants: Secondary | ICD-10-CM | POA: Diagnosis not present

## 2022-12-27 ENCOUNTER — Ambulatory Visit: Payer: Medicare Other | Attending: Cardiovascular Disease

## 2022-12-27 DIAGNOSIS — I5022 Chronic systolic (congestive) heart failure: Secondary | ICD-10-CM | POA: Diagnosis not present

## 2022-12-27 DIAGNOSIS — Z95810 Presence of automatic (implantable) cardiac defibrillator: Secondary | ICD-10-CM

## 2022-12-29 NOTE — Progress Notes (Signed)
EPIC Encounter for ICM Monitoring  Patient Name: Brian Bruce is a 77 y.o. male Date: 12/29/2022 Primary Care Physican: Toma Deiters, MD Primary Cardiologist: [facility] Electrophysiologist: Mealor Bi-V Pacing: 96%            11/03/2021 Weight: 188 lbs 03/02/2022 Weight: 182 lbs 06/22/2022 Weight: 184 lbs 08/31/2022 Weight: 183 lbs 10/03/2022 Weight: 183 lbs 11/25/2022 Weight: 183 lbs 12/28/2022 Weight: 182 lbs   AT/AF Burden 0% (taking Warfarin)                                                          Spoke with patient and heart failure questions reviewed.  Transmission results reviewed.  Pt asymptomatic for fluid accumulation.  Reports feeling well at this time and voices no complaints.      CorVue thoracic impedance suggesting normal fluid levels within the last month.   Prescribed: Torsemide 20 mg take 1 tablet daily.  He takes 0.5 extra tablet for fluid symptoms such as wt gain or leg swelling.   Labs: 05/07/224 Creatinine 0.72, BUN 9, Potassium 4.1, Sodium 134, GFR >60 A complete set of results can be found in Results Review.   Recommendations:  No changes and encouraged to call if experiencing any fluid symptoms.   Follow-up plan: ICM clinic phone appointment on 01/30/2023.  91 day device clinic remote transmission 02/09/2023.     EP/Cardiology Office Visits:  Recall 06/07/2023 with Dr. Nelly Laurence.     Copy of ICM check sent to Dr. Nelly Laurence.   3 month ICM trend: 12/27/2022.    12-14 Month ICM trend:     Karie Soda, RN 12/29/2022 1:36 PM

## 2023-01-05 DIAGNOSIS — H35373 Puckering of macula, bilateral: Secondary | ICD-10-CM | POA: Diagnosis not present

## 2023-01-27 DIAGNOSIS — Z7901 Long term (current) use of anticoagulants: Secondary | ICD-10-CM | POA: Diagnosis not present

## 2023-01-30 ENCOUNTER — Ambulatory Visit: Payer: Medicare Other | Attending: Cardiovascular Disease

## 2023-01-30 DIAGNOSIS — Z95810 Presence of automatic (implantable) cardiac defibrillator: Secondary | ICD-10-CM

## 2023-01-30 DIAGNOSIS — I5022 Chronic systolic (congestive) heart failure: Secondary | ICD-10-CM | POA: Diagnosis not present

## 2023-01-31 NOTE — Progress Notes (Signed)
EPIC Encounter for ICM Monitoring  Patient Name: GEORDON SOLIN is a 77 y.o. male Date: 01/31/2023 Primary Care Physican: Toma Deiters, MD Primary Cardiologist: [facility] Electrophysiologist: Mealor Bi-V Pacing: 96%            11/25/2022 Weight: 183 lbs 01/31/2023 Weight: 182 lbs    AT/AF Burden 0% (taking Warfarin)                                                          Spoke with patient and heart failure questions reviewed.  Transmission results reviewed.  Pt reports a little ankle swelling in the past few days.   CorVue thoracic impedance suggesting possible fluid accumulation starting 6/29   Prescribed: Torsemide 20 mg take 1 tablet daily.  He takes 0.5 extra tablet for fluid symptoms such as wt gain or leg swelling.   Labs: 05/07/224 Creatinine 0.72, BUN 9, Potassium 4.1, Sodium 134, GFR >60 A complete set of results can be found in Results Review.   Recommendations:   Advised to take 0.5 extra torsemide tablet and will recheck fluid levels.   Follow-up plan: ICM clinic phone appointment on 02/06/2023 to recheck fluid leels.  91 day device clinic remote transmission 02/09/2023.     EP/Cardiology Office Visits:  Recall 06/07/2023 with Dr. Nelly Laurence.     Copy of ICM check sent to Dr. Nelly Laurence.   3 month ICM trend: 01/30/2023.    12-14 Month ICM trend:     Karie Soda, RN 01/31/2023 12:51 PM

## 2023-02-06 ENCOUNTER — Ambulatory Visit: Payer: Medicare Other | Attending: Cardiovascular Disease

## 2023-02-06 DIAGNOSIS — I5022 Chronic systolic (congestive) heart failure: Secondary | ICD-10-CM

## 2023-02-06 DIAGNOSIS — Z95810 Presence of automatic (implantable) cardiac defibrillator: Secondary | ICD-10-CM

## 2023-02-07 NOTE — Progress Notes (Signed)
EPIC Encounter for ICM Monitoring  Patient Name: Brian Bruce is a 77 y.o. male Date: 02/07/2023 Primary Care Physican: Toma Deiters, MD Primary Cardiologist: [facility] Electrophysiologist: Mealor Bi-V Pacing: 96%            11/25/2022 Weight: 183 lbs 01/31/2023 Weight: 182 lbs  02/07/2023 Weight: 182 lbs   AT/AF Burden 0% (taking Warfarin)                                                          Spoke with patient and heart failure questions reviewed.  Transmission results reviewed.  He is asymptomatic for fluid accumulation .  He reports eating at restaurants more than usual and possibly drinking more than 64 oz fluid daily in the last week. He took extra Torsemide 7/8   CorVue thoracic impedance suggesting fluid levels returned to normal 7/8.   Prescribed: Torsemide 20 mg take 1 tablet daily.  He takes 0.5 extra tablet for fluid symptoms such as wt gain or leg swelling.   Labs: 05/07/224 Creatinine 0.72, BUN 9, Potassium 4.1, Sodium 134, GFR >60 A complete set of results can be found in Results Review.   Recommendations:   Advised to avoid or decrease restaurant foods and limit fluid intake to 64 oz daily.  Encouraged to call if develops fluid symptoms.    Follow-up plan: ICM clinic phone appointment on 03/06/2023.  91 day device clinic remote transmission 05/12/2023.     EP/Cardiology Office Visits:  Recall 06/07/2023 with Dr. Nelly Laurence.     Copy of ICM check sent to Dr. Nelly Laurence.   3 month ICM trend: 02/06/2023.    12-14 Month ICM trend:     Karie Soda, RN 02/07/2023 12:18 PM

## 2023-02-09 ENCOUNTER — Ambulatory Visit (INDEPENDENT_AMBULATORY_CARE_PROVIDER_SITE_OTHER): Payer: Medicare Other

## 2023-02-09 ENCOUNTER — Other Ambulatory Visit: Payer: Self-pay | Admitting: Cardiovascular Disease

## 2023-02-09 DIAGNOSIS — I255 Ischemic cardiomyopathy: Secondary | ICD-10-CM

## 2023-02-09 LAB — CUP PACEART REMOTE DEVICE CHECK
Battery Remaining Longevity: 41 mo
Battery Remaining Percentage: 49 %
Battery Voltage: 2.93 V
Brady Statistic AP VP Percent: 93 %
Brady Statistic AP VS Percent: 1 %
Brady Statistic AS VP Percent: 3.6 %
Brady Statistic AS VS Percent: 3.3 %
Brady Statistic RA Percent Paced: 93 %
Date Time Interrogation Session: 20240711020457
HighPow Impedance: 53 Ohm
Implantable Lead Connection Status: 753985
Implantable Lead Connection Status: 753985
Implantable Lead Connection Status: 753985
Implantable Lead Implant Date: 20201015
Implantable Lead Implant Date: 20201015
Implantable Lead Implant Date: 20201015
Implantable Lead Location: 753858
Implantable Lead Location: 753859
Implantable Lead Location: 753860
Implantable Pulse Generator Implant Date: 20201015
Lead Channel Impedance Value: 360 Ohm
Lead Channel Impedance Value: 400 Ohm
Lead Channel Impedance Value: 480 Ohm
Lead Channel Pacing Threshold Amplitude: 0.625 V
Lead Channel Pacing Threshold Amplitude: 1 V
Lead Channel Pacing Threshold Amplitude: 1.75 V
Lead Channel Pacing Threshold Pulse Width: 0.5 ms
Lead Channel Pacing Threshold Pulse Width: 0.5 ms
Lead Channel Pacing Threshold Pulse Width: 0.8 ms
Lead Channel Sensing Intrinsic Amplitude: 1.9 mV
Lead Channel Sensing Intrinsic Amplitude: 12 mV
Lead Channel Setting Pacing Amplitude: 1.625
Lead Channel Setting Pacing Amplitude: 2 V
Lead Channel Setting Pacing Amplitude: 2.25 V
Lead Channel Setting Pacing Pulse Width: 0.5 ms
Lead Channel Setting Pacing Pulse Width: 0.8 ms
Lead Channel Setting Sensing Sensitivity: 0.5 mV
Pulse Gen Serial Number: 111006847
Zone Setting Status: 755011

## 2023-02-09 NOTE — Telephone Encounter (Signed)
This is a A-Fib clinic pt 

## 2023-02-14 DIAGNOSIS — E7849 Other hyperlipidemia: Secondary | ICD-10-CM | POA: Diagnosis not present

## 2023-02-14 DIAGNOSIS — I10 Essential (primary) hypertension: Secondary | ICD-10-CM | POA: Diagnosis not present

## 2023-02-14 DIAGNOSIS — I5022 Chronic systolic (congestive) heart failure: Secondary | ICD-10-CM | POA: Diagnosis not present

## 2023-02-14 DIAGNOSIS — E1159 Type 2 diabetes mellitus with other circulatory complications: Secondary | ICD-10-CM | POA: Diagnosis not present

## 2023-02-14 DIAGNOSIS — M1712 Unilateral primary osteoarthritis, left knee: Secondary | ICD-10-CM | POA: Diagnosis not present

## 2023-02-24 DIAGNOSIS — Z7901 Long term (current) use of anticoagulants: Secondary | ICD-10-CM | POA: Diagnosis not present

## 2023-02-28 NOTE — Progress Notes (Signed)
Remote ICD transmission.   

## 2023-03-06 ENCOUNTER — Ambulatory Visit: Payer: Medicare Other | Attending: Cardiovascular Disease

## 2023-03-06 DIAGNOSIS — I5022 Chronic systolic (congestive) heart failure: Secondary | ICD-10-CM

## 2023-03-06 DIAGNOSIS — Z95810 Presence of automatic (implantable) cardiac defibrillator: Secondary | ICD-10-CM

## 2023-03-08 ENCOUNTER — Telehealth: Payer: Self-pay

## 2023-03-08 NOTE — Telephone Encounter (Signed)
Remote ICM transmission received.  Attempted call to patient regarding ICM remote transmission and left detailed message per DPR.  Left ICM phone number and advised to return call for any fluid symptoms or questions. Next ICM remote transmission scheduled 04/10/2023.

## 2023-03-08 NOTE — Progress Notes (Signed)
EPIC Encounter for ICM Monitoring  Patient Name: Brian Bruce is a 77 y.o. male Date: 03/08/2023 Primary Care Physican: Toma Deiters, MD Primary Cardiologist: [facility] Electrophysiologist: Mealor Bi-V Pacing: 96%            11/25/2022 Weight: 183 lbs 01/31/2023 Weight: 182 lbs  02/07/2023 Weight: 182 lbs   AT/AF Burden <1% (taking Warfarin)                                                          Attempted call to patient and unable to reach.  Left detailed message per DPR regarding transmission. Transmission reviewed.    CorVue thoracic impedance suggesting normal fluid levels with the exception of possible fluid accumulation from 7/21-7/25.   Prescribed: Torsemide 20 mg take 1 tablet daily.  He takes 0.5 extra tablet for fluid symptoms such as wt gain or leg swelling.   Labs: 12/06/2022 Creatinine 0.72, BUN 9, Potassium 4.1, Sodium 134, GFR >60 A complete set of results can be found in Results Review.   Recommendations:   Left voice mail with ICM number and encouraged to call if experiencing any fluid symptoms.   Follow-up plan: ICM clinic phone appointment on 04/10/2023.  91 day device clinic remote transmission 05/12/2023.     EP/Cardiology Office Visits:  Recall 06/07/2023 with Dr. Nelly Laurence.     Copy of ICM check sent to Dr. Nelly Laurence.   3 month ICM trend: 03/06/2023.    12-14 Month ICM trend:     Karie Soda, RN 03/08/2023 10:39 AM

## 2023-03-31 DIAGNOSIS — Z7901 Long term (current) use of anticoagulants: Secondary | ICD-10-CM | POA: Diagnosis not present

## 2023-04-10 ENCOUNTER — Ambulatory Visit: Payer: Medicare Other | Attending: Cardiovascular Disease

## 2023-04-10 DIAGNOSIS — Z95810 Presence of automatic (implantable) cardiac defibrillator: Secondary | ICD-10-CM

## 2023-04-10 DIAGNOSIS — I5022 Chronic systolic (congestive) heart failure: Secondary | ICD-10-CM | POA: Diagnosis not present

## 2023-04-14 NOTE — Progress Notes (Signed)
EPIC Encounter for ICM Monitoring  Patient Name: Brian Bruce is a 77 y.o. male Date: 04/14/2023 Primary Care Physican: Toma Deiters, MD Primary Cardiologist: [facility] Electrophysiologist: Mealor Bi-V Pacing: 96%            11/25/2022 Weight: 183 lbs 01/31/2023 Weight: 182 lbs  02/07/2023 Weight: 182 lbs 04/14/2023 Weight: 182-183 lbs    AT/AF Burden <1% (taking Warfarin)                                                          Spoke with patient and heart failure questions reviewed.  Transmission results reviewed.  Pt asymptomatic for fluid accumulation.  Reports feeling well at this time and voices no complaints.     CorVue thoracic impedance suggesting normal fluid levels with the exception of possible fluid accumulation from 9/2-9/7.   Prescribed: Torsemide 20 mg take 1 tablet daily.  He takes 0.5 extra tablet for fluid symptoms such as wt gain or leg swelling.   Labs: 12/06/2022 Creatinine 0.72, BUN 9, Potassium 4.1, Sodium 134, GFR >60 A complete set of results can be found in Results Review.   Recommendations:   No changes and encouraged to call if experiencing any fluid symptoms.   Follow-up plan: ICM clinic phone appointment on 05/15/2023.  91 day device clinic remote transmission 05/12/2023.     EP/Cardiology Office Visits:  Recall 06/07/2023 with Dr. Nelly Laurence.     Copy of ICM check sent to Dr. Nelly Laurence.   3 month ICM trend: 04/10/2023.    12-14 Month ICM trend:     Karie Soda, RN 04/14/2023 12:05 PM

## 2023-04-17 DIAGNOSIS — E782 Mixed hyperlipidemia: Secondary | ICD-10-CM | POA: Diagnosis not present

## 2023-04-17 DIAGNOSIS — I5032 Chronic diastolic (congestive) heart failure: Secondary | ICD-10-CM | POA: Diagnosis not present

## 2023-04-17 DIAGNOSIS — I10 Essential (primary) hypertension: Secondary | ICD-10-CM | POA: Diagnosis not present

## 2023-04-17 DIAGNOSIS — I2510 Atherosclerotic heart disease of native coronary artery without angina pectoris: Secondary | ICD-10-CM | POA: Diagnosis not present

## 2023-04-17 DIAGNOSIS — I480 Paroxysmal atrial fibrillation: Secondary | ICD-10-CM | POA: Diagnosis not present

## 2023-04-28 DIAGNOSIS — M1712 Unilateral primary osteoarthritis, left knee: Secondary | ICD-10-CM | POA: Diagnosis not present

## 2023-04-28 DIAGNOSIS — I5022 Chronic systolic (congestive) heart failure: Secondary | ICD-10-CM | POA: Diagnosis not present

## 2023-04-28 DIAGNOSIS — E7849 Other hyperlipidemia: Secondary | ICD-10-CM | POA: Diagnosis not present

## 2023-04-28 DIAGNOSIS — E1159 Type 2 diabetes mellitus with other circulatory complications: Secondary | ICD-10-CM | POA: Diagnosis not present

## 2023-04-28 DIAGNOSIS — Z7901 Long term (current) use of anticoagulants: Secondary | ICD-10-CM | POA: Diagnosis not present

## 2023-04-28 DIAGNOSIS — I10 Essential (primary) hypertension: Secondary | ICD-10-CM | POA: Diagnosis not present

## 2023-05-11 ENCOUNTER — Ambulatory Visit: Payer: Medicare Other

## 2023-05-11 DIAGNOSIS — I255 Ischemic cardiomyopathy: Secondary | ICD-10-CM

## 2023-05-11 LAB — CUP PACEART REMOTE DEVICE CHECK
Battery Remaining Longevity: 38 mo
Battery Remaining Percentage: 46 %
Battery Voltage: 2.93 V
Brady Statistic AP VP Percent: 93 %
Brady Statistic AP VS Percent: 1 %
Brady Statistic AS VP Percent: 3.2 %
Brady Statistic AS VS Percent: 2.9 %
Brady Statistic RA Percent Paced: 94 %
Date Time Interrogation Session: 20241010035659
HighPow Impedance: 60 Ohm
Implantable Lead Connection Status: 753985
Implantable Lead Connection Status: 753985
Implantable Lead Connection Status: 753985
Implantable Lead Implant Date: 20201015
Implantable Lead Implant Date: 20201015
Implantable Lead Implant Date: 20201015
Implantable Lead Location: 753858
Implantable Lead Location: 753859
Implantable Lead Location: 753860
Implantable Pulse Generator Implant Date: 20201015
Lead Channel Impedance Value: 360 Ohm
Lead Channel Impedance Value: 400 Ohm
Lead Channel Impedance Value: 480 Ohm
Lead Channel Pacing Threshold Amplitude: 0.625 V
Lead Channel Pacing Threshold Amplitude: 1 V
Lead Channel Pacing Threshold Amplitude: 1.5 V
Lead Channel Pacing Threshold Pulse Width: 0.5 ms
Lead Channel Pacing Threshold Pulse Width: 0.5 ms
Lead Channel Pacing Threshold Pulse Width: 0.8 ms
Lead Channel Sensing Intrinsic Amplitude: 12 mV
Lead Channel Sensing Intrinsic Amplitude: 2 mV
Lead Channel Setting Pacing Amplitude: 1.625
Lead Channel Setting Pacing Amplitude: 2 V
Lead Channel Setting Pacing Amplitude: 2 V
Lead Channel Setting Pacing Pulse Width: 0.5 ms
Lead Channel Setting Pacing Pulse Width: 0.8 ms
Lead Channel Setting Sensing Sensitivity: 0.5 mV
Pulse Gen Serial Number: 111006847
Zone Setting Status: 755011

## 2023-05-12 DIAGNOSIS — Z23 Encounter for immunization: Secondary | ICD-10-CM | POA: Diagnosis not present

## 2023-05-15 ENCOUNTER — Ambulatory Visit: Payer: Medicare Other | Attending: Cardiovascular Disease

## 2023-05-15 DIAGNOSIS — Z95810 Presence of automatic (implantable) cardiac defibrillator: Secondary | ICD-10-CM | POA: Diagnosis not present

## 2023-05-15 DIAGNOSIS — I5022 Chronic systolic (congestive) heart failure: Secondary | ICD-10-CM

## 2023-05-17 NOTE — Progress Notes (Signed)
EPIC Encounter for ICM Monitoring  Patient Name: Brian Bruce is a 77 y.o. male Date: 05/17/2023 Primary Care Physican: Toma Deiters, MD Primary Cardiologist: [facility] Electrophysiologist: Mealor Bi-V Pacing: 97%            11/25/2022 Weight: 183 lbs 01/31/2023 Weight: 182 lbs  02/07/2023 Weight: 182 lbs 04/14/2023 Weight: 182-183 lbs     AT/AF Burden <1% (taking Warfarin)                                                          Spoke with patient and heart failure questions reviewed.  Transmission results reviewed.  Pt asymptomatic for fluid accumulation.  Reports feeling well at this time and voices no complaints.  TKR scheduled 10/28.  Discussed possibility of having fluid retention after surgery.  He takes extra Torsemide is needed.    CorVue thoracic impedance suggesting intermittent days with possible fluid accumulation within the last month.   Prescribed: Torsemide 20 mg take 1 tablet daily.     Labs: 12/06/2022 Creatinine 0.72, BUN 9, Potassium 4.1, Sodium 134, GFR >60 A complete set of results can be found in Results Review.   Recommendations:   No changes and encouraged to call if experiencing any fluid symptoms.   Follow-up plan: ICM clinic phone appointment on 06/19/2023.  91 day device clinic remote transmission 08/09/2022.     EP/Cardiology Office Visits:  06/09/2023 with Dr. Nelly Laurence.     Copy of ICM check sent to Dr. Nelly Laurence.   3 month ICM trend: 05/15/2023.    12-14 Month ICM trend:     Karie Soda, RN 05/17/2023 2:51 PM

## 2023-05-18 ENCOUNTER — Encounter: Payer: Self-pay | Admitting: Cardiovascular Disease

## 2023-05-18 DIAGNOSIS — M1712 Unilateral primary osteoarthritis, left knee: Secondary | ICD-10-CM | POA: Diagnosis not present

## 2023-05-18 DIAGNOSIS — I10 Essential (primary) hypertension: Secondary | ICD-10-CM | POA: Diagnosis not present

## 2023-05-18 DIAGNOSIS — E1159 Type 2 diabetes mellitus with other circulatory complications: Secondary | ICD-10-CM | POA: Diagnosis not present

## 2023-05-18 DIAGNOSIS — I5022 Chronic systolic (congestive) heart failure: Secondary | ICD-10-CM | POA: Diagnosis not present

## 2023-05-18 DIAGNOSIS — E7849 Other hyperlipidemia: Secondary | ICD-10-CM | POA: Diagnosis not present

## 2023-05-18 NOTE — Pre-Procedure Instructions (Signed)
Surgical Instructions    Your procedure is scheduled on May 29, 2023.  Report to [facility] Main Entrance "A" at 10:30 A.M., then check in with the Admitting office.  Call this number if you have problems the morning of surgery:  [phone]   If you have any questions prior to your surgery date call [phone]: Open Monday-Friday 8am-4pm If you experience any cold or flu symptoms such as cough, fever, chills, shortness of breath, etc. between now and your scheduled surgery, please notify us at the above number     Remember:  Do not eat after midnight the night before your surgery  You may drink clear liquids until 9:30AM the morning of your surgery.   Clear liquids allowed are: Water, Non-Citrus Juices (without pulp), Carbonated Beverages, Clear Tea, Black Coffee ONLY (NO MILK, CREAM OR POWDERED CREAMER of any kind), and Gatorade    Take these medicines the morning of surgery with A SIP OF WATER:  amLODipine (NORVASC)  carvedilol (COREG)  digoxin (LANOXIN)  dofetilide (TIKOSYN)  hydrALAZINE (APRESOLINE)  olopatadine (PATADAY) 0.1 % ophthalmic solution   If needed: acetaminophen (TYLENOL)   Follow your surgeon's instructions on when to stop taking Aspirin and warfarin (Coumadin) prior to surgery. If no instructions were given, please contact your surgeon's office.   As of today, STOP taking any Aleve, Naproxen, Ibuprofen, Motrin, Advil, Goody's, BC's, all herbal medications, fish oil, and all vitamins and diclofenac Sodium (VOLTAREN) 1 % GEL   WHAT DO I DO ABOUT MY DIABETES MEDICATION?   Do not take oral diabetes medicines (pills) the morning of surgery. DO NOT take glimepiride (AMARYL) or metFORMIN (GLUCOPHAGE) the day of surgery.   DO NOT take glimepiride (Amaryl) the night before surgery. It is okay to take this in the morning on the day before surgery.    The day of surgery, do not take other diabetes injectables, including Byetta (exenatide), Bydureon (exenatide  ER), Victoza (liraglutide), or Trulicity (dulaglutide).  If your CBG is greater than 220 mg/dL, you may take  of your sliding scale (correction) dose of insulin.   HOW TO MANAGE YOUR DIABETES BEFORE AND AFTER SURGERY  Why is it important to control my blood sugar before and after surgery? Improving blood sugar levels before and after surgery helps healing and can limit problems. A way of improving blood sugar control is eating a healthy diet by:  Eating less sugar and carbohydrates  Increasing activity/exercise  Talking with your doctor about reaching your blood sugar goals High blood sugars (greater than 180 mg/dL) can raise your risk of infections and slow your recovery, so you will need to focus on controlling your diabetes during the weeks before surgery. Make sure that the doctor who takes care of your diabetes knows about your planned surgery including the date and location.  How do I manage my blood sugar before surgery? Check your blood sugar at least 4 times a day, starting 2 days before surgery, to make sure that the level is not too high or low.  Check your blood sugar the morning of your surgery when you wake up and every 2 hours until you get to the Short Stay unit.  If your blood sugar is less than 70 mg/dL, you will need to treat for low blood sugar: Do not take insulin. Treat a low blood sugar (less than 70 mg/dL) with  cup of clear juice (cranberry or apple), 4 glucose tablets, OR glucose gel. Recheck blood sugar in 15 minutes after treatment (  to make sure it is greater than 70 mg/dL). If your blood sugar is not greater than 70 mg/dL on recheck, call [phone] for further instructions. Report your blood sugar to the short stay nurse when you get to Short Stay.  If you are admitted to the hospital after surgery: Your blood sugar will be checked by the staff and you will probably be given insulin after surgery (instead of oral diabetes medicines) to make sure you have  good blood sugar levels. The goal for blood sugar control after surgery is 80-180 mg/dL.   [facility] is not responsible for any belongings or valuables.    Do NOT Smoke (Tobacco/Vaping)  24 hours prior to your procedure  If you use a CPAP at night, you may bring your mask for your overnight stay.   Contacts, glasses, hearing aids, dentures or partials may not be worn into surgery, please bring cases for these belongings   For patients admitted to the hospital, discharge time will be determined by your treatment team.   Patients discharged the day of surgery will not be allowed to drive home, and someone needs to stay with them for 24 hours.   SURGICAL WAITING ROOM VISITATION Patients having surgery or a procedure may have no more than 2 support people in the waiting area - these visitors may rotate.   Children under the age of 87 must have an adult with them who is not the patient. If the patient needs to stay at the hospital during part of their recovery, the visitor guidelines for inpatient rooms apply. Pre-op nurse will coordinate an appropriate time for 1 support person to accompany patient in pre-op.  This support person may not rotate.   Please refer to [email or web address] for the visitor guidelines for Inpatients (after your surgery is over and you are in a regular room).    Special instructions:    Oral Hygiene is also important to reduce your risk of infection.  Remember - BRUSH YOUR TEETH THE MORNING OF SURGERY WITH YOUR REGULAR TOOTHPASTE     Pre-operative 5 CHG Bath Instructions   You can play a key role in reducing the risk of infection after surgery. Your skin needs to be as free of germs as possible. You can reduce the number of germs on your skin by washing with CHG (chlorhexidine gluconate) soap before surgery. CHG is an antiseptic soap that kills germs and continues to kill germs even after washing.   DO NOT  use if you have an allergy to chlorhexidine/CHG or antibacterial soaps. If your skin becomes reddened or irritated, stop using the CHG and notify one of our RNs at [phone].   Please shower with the CHG soap starting 4 days before surgery using the following schedule:     Please keep in mind the following:  DO NOT shave, including legs and underarms, starting the day of your first shower.   You may shave your face at any point before/day of surgery.  Place clean sheets on your bed the day you start using CHG soap. Use a clean washcloth (not used since being washed) for each shower. DO NOT sleep with pets once you start using the CHG.   CHG Shower Instructions:  If you choose to wash your hair and private area, wash first with your normal shampoo/soap.  After you use shampoo/soap, rinse your hair and body thoroughly to remove shampoo/soap residue.  Turn the water OFF and apply about 3 tablespoons (45 ml) of CHG soap  to a [employer].  Apply CHG soap ONLY FROM YOUR NECK DOWN TO YOUR TOES (washing for 3-5 minutes)  DO NOT use CHG soap on face, private areas, open wounds, or sores.  Pay special attention to the area where your surgery is being performed.  If you are having back surgery, having someone wash your back for you may be helpful. Wait 2 minutes after CHG soap is applied, then you may rinse off the CHG soap.  Pat dry with a clean towel  Put on clean clothes/pajamas   If you choose to wear lotion, please use ONLY the CHG-compatible lotions on the back of this paper.     Additional instructions for the day of surgery: DO NOT APPLY any lotions, deodorants, cologne, or perfumes.   Put on clean/comfortable clothes.  Brush your teeth.  Ask your nurse before applying any prescription medications to the skin.         Do not wear jewelry or makeup. Do not bring valuables to the hospital. Do not wear nail polish, gel polish, artificial nails, or any other type of covering on  natural nails (fingers and toes) If you have artificial nails or gel coating that need to be removed by a nail salon, please have this removed prior to surgery. Artificial nails or gel coating may interfere with anesthesia's ability to adequately monitor your vital signs.     CHG Compatible Lotions   Aveeno Moisturizing lotion  Cetaphil Moisturizing Cream  Cetaphil Moisturizing Lotion  Clairol Herbal Essence Moisturizing Lotion, Dry Skin  Clairol Herbal Essence Moisturizing Lotion, Extra Dry Skin  Clairol Herbal Essence Moisturizing Lotion, Normal Skin  Curel Age Defying Therapeutic Moisturizing Lotion with Alpha Hydroxy  Curel Extreme Care Body Lotion  Curel Soothing Hands Moisturizing Hand Lotion  Curel Therapeutic Moisturizing Cream, Fragrance-Free  Curel Therapeutic Moisturizing Lotion, Fragrance-Free  Curel Therapeutic Moisturizing Lotion, Original Formula  Eucerin Daily Replenishing Lotion  Eucerin Dry Skin Therapy Plus Alpha Hydroxy Crme  Eucerin Dry Skin Therapy Plus Alpha Hydroxy Lotion  Eucerin Original Crme  Eucerin Original Lotion  Eucerin Plus Crme Eucerin Plus Lotion  Eucerin TriLipid Replenishing Lotion  Keri Anti-Bacterial Hand Lotion  Keri Deep Conditioning Original Lotion Dry Skin Formula Softly Scented  Keri Deep Conditioning Original Lotion, Fragrance Free Sensitive Skin Formula  Keri Lotion Fast Absorbing Fragrance Free Sensitive Skin Formula  Keri Lotion Fast Absorbing Softly Scented Dry Skin Formula  Keri Original Lotion  Keri Skin Renewal Lotion Keri Silky Smooth Lotion  Keri Silky Smooth Sensitive Skin Lotion  Nivea Body Creamy Conditioning Oil  Nivea Body Extra Enriched Lotion  Nivea Body Original Lotion  Nivea Body Sheer Moisturizing Lotion Nivea Crme  Nivea Skin Firming Lotion  NutraDerm 30 Skin Lotion  NutraDerm Skin Lotion  NutraDerm Therapeutic Skin Cream  NutraDerm Therapeutic Skin Lotion  ProShield Protective Hand Cream  Provon  moisturizing lotion     If you received a COVID test during your pre-op visit, it is requested that you wear a mask when out in public, stay away from anyone that may not be feeling well, and notify your surgeon if you develop symptoms. If you have been in contact with anyone that has tested positive in the last 10 days, please notify your surgeon.    Please read over the following fact sheets that you were given.

## 2023-05-18 NOTE — Progress Notes (Signed)
PERIOPERATIVE PRESCRIPTION FOR IMPLANTED CARDIAC DEVICE PROGRAMMING  Patient Information: Name:  Brian Bruce  DOB:  1946/04/19  MRN:  [id number]    Planned Procedure:  LEFT TOTAL KNEE ARTHROPLASTY - Left  Surgeon:  Annell Greening  Date of Procedure:  05/29/23  Cautery will be used.  Position during surgery:  supine   Please send documentation back to:  [facility] (Fax # [phone])   Device Information:  Clinic EP Physician:  Dr. York Pellant   Device Type:  Defibrillator Manufacturer and Phone #:  St. Jude/Abbott: [phone] Pacemaker Dependent?:  Unknown Date of Last Device Check:  05/15/2023 Normal Device Function?:  Yes.    Electrophysiologist's Recommendations:  Have magnet available. Provide continuous ECG monitoring when magnet is used or reprogramming is to be performed.  Procedure should not interfere with device function.  No device programming or magnet placement needed.  Per Device Clinic Standing Orders, Lenor Coffin, RN  3:09 PM 05/18/2023

## 2023-05-19 ENCOUNTER — Encounter (HOSPITAL_COMMUNITY)
Admission: RE | Admit: 2023-05-19 | Discharge: 2023-05-19 | Disposition: A | Discharge status: Home / Self Care | Payer: Medicare Other | Source: Ambulatory Visit | Attending: Orthopaedic Surgery | Admitting: Orthopaedic Surgery

## 2023-05-19 ENCOUNTER — Encounter (HOSPITAL_COMMUNITY): Payer: Self-pay

## 2023-05-19 ENCOUNTER — Other Ambulatory Visit: Payer: Self-pay

## 2023-05-19 VITALS — BP 148/61 | HR 63 | Temp 97.7°F | Resp 18 | Ht 68.0 in | Wt 188.3 lb

## 2023-05-19 DIAGNOSIS — Z01812 Encounter for preprocedural laboratory examination: Secondary | ICD-10-CM | POA: Diagnosis not present

## 2023-05-19 DIAGNOSIS — Z01818 Encounter for other preprocedural examination: Secondary | ICD-10-CM | POA: Diagnosis present

## 2023-05-19 HISTORY — DX: Unspecified osteoarthritis, unspecified site: M19.90

## 2023-05-19 HISTORY — DX: Presence of automatic (implantable) cardiac defibrillator: Z95.810

## 2023-05-19 HISTORY — DX: Essential (primary) hypertension: I10

## 2023-05-19 LAB — CBC
HCT: 39.1 % (ref 39.0–52.0)
Hemoglobin: 13 g/dL (ref 13.0–17.0)
MCH: 31.2 pg (ref 26.0–34.0)
MCHC: 33.2 g/dL (ref 30.0–36.0)
MCV: 93.8 fL (ref 80.0–100.0)
Platelets: 291 10*3/uL (ref 150–400)
RBC: 4.17 MIL/uL — ABNORMAL LOW (ref 4.22–5.81)
RDW: 13.2 % (ref 11.5–15.5)
WBC: 9.8 10*3/uL (ref 4.0–10.5)
nRBC: 0 % (ref 0.0–0.2)

## 2023-05-19 LAB — BASIC METABOLIC PANEL
Anion gap: 11 (ref 5–15)
BUN: 11 mg/dL (ref 8–23)
CO2: 24 mmol/L (ref 22–32)
Calcium: 9.6 mg/dL (ref 8.9–10.3)
Chloride: 103 mmol/L (ref 98–111)
Creatinine, Ser: 0.7 mg/dL (ref 0.61–1.24)
GFR, Estimated: >60 mL/min (ref >60)
Glucose, Bld: 143 mg/dL — ABNORMAL HIGH (ref 70–99)
Potassium: 4 mmol/L (ref 3.5–5.1)
Sodium: 138 mmol/L (ref 135–145)

## 2023-05-19 LAB — GLUCOSE, CAPILLARY: Glucose-Capillary: 160 mg/dL — ABNORMAL HIGH (ref 70–99)

## 2023-05-19 LAB — SURGICAL PCR SCREEN
MRSA, PCR: NEGATIVE
Staphylococcus aureus: NEGATIVE

## 2023-05-19 NOTE — Plan of Care (Signed)
CHL Tonsillectomy/Adenoidectomy, Postoperative PEDS care plan entered in error.

## 2023-05-21 NOTE — Progress Notes (Addendum)
PCP - Dr.Xaju Hasangi  Cardiologist - Dr Gwynne Edinger   EP- was Dr. Addison Naegeli  Pulm-no  Chest x-ray - na  EKG - 12/06/22- cone, 9+/236/24 - [facility]  Stress Test - 02/02/19- [facility]  ECHO - 12/27/19  Cardiac Cath - 03/07/19  AICD-Yes- St Jude- have orders- rep needs to be notified  Nerve Stimulator-no  Sleep Study - no CPAP - no  LABS-CBC, BMP.  DOS PT, PTT, A1C  ASA- hold 5 days per DR Ophelia Charter with secure chat Coumadin - hold 5 days- patient verbalized.  ERAS-Clears [phone].  HA1C-DOS GLP-1-no Fasting Blood Sugar - 120's-  Does not check daily.  Anesthesia-  Pt denies having chest pain, sob, or fever at this time. All instructions explained to the pt, with a verbal understanding of the material. Pt agrees to go over the instructions while at home for a better understanding. The opportunity to ask questions was provided.

## 2023-05-22 NOTE — Progress Notes (Signed)
Anesthesia Chart Review:  Case: [id number] Date/Time: 05/29/23 1215   Procedure: LEFT TOTAL KNEE ARTHROPLASTY (Left: Knee) - Needs RNFA   Anesthesia type: General   Pre-op diagnosis: left knee osteoarthritis   [facility] ROOM 05 / [facility]   Surgeons: Eldred Manges, MD       DISCUSSION: Patient is a 77 year old male scheduled for the above procedure.   History includes former smoker (quit 08/02/71), CAD (s/p CABG 1998; LHC 03/07/19: Severe native CAD with CTO LAD & RPDA, patent LIMA-DIAG-LAD and CTO SVG-RCA), ischemic cardiomyopathy, left BBB, chronic systolic CHF, PAF, ICD (St. Jude/Abbott [id number] Gallant HF CRT-D 05/16/19 for primary prevention), DM2, asthma, osteoarthritis.  Last cardiology visit with Dr. Foy Guadalajara on 04/17/23 (see [facility]). He wrote, "Preoperative cardiovascular examination Patient likely will be needing knee surgery in the near future. We discussed the process of preoperative cardiovascular examination at length today. Patient has a complex history of cardiovascular disease, but is doing well with good exercise tolerance. Do not think he needs any further testing or intervention prior to the planned surgery. He is okay to stop his warfarin for up to 5 days prior to surgery, restarting the day after surgery as long as this is deemed safe from a surgical perspective." Six month follow-up is planned.  EP ICD Perioperative Recommendations: Device Information: Clinic EP Physician:  Dr. York Pellant  Device Type:  Defibrillator Manufacturer and Phone #:  St. Jude/Abbott: [phone] Pacemaker Dependent?:  Unknown Date of Last Device Check:  05/15/2023     Normal Device Function?:  Yes.     Electrophysiologist's Recommendations: Have magnet available. Provide continuous ECG monitoring when magnet is used or reprogramming is to be performed.  Procedure should not interfere with device function.  No device programming or magnet placement needed.   Last A1c seen is fro  m4/16/24 with result of 7.7%. I reached out to Ortho since he has an APP appointment with them on 05/24/23. If updated A1c needed prior to surgery then will see if this can be arranged then.   Anesthesia team evaluate on the day of surgery. Plan for PT/INR on the day of surgery.   VS: BP (!) 148/61   Pulse 63   Temp 36.5 C (Oral)   Resp 18   Ht 5\' 8"  (1.727 m)   Wt 85.4 kg   SpO2 98%   BMI 28.63 kg/m    PROVIDERS: Toma Deiters, MD is PCP  Gwynne Edinger, MD is cardiologist [facility] - Jonita Albee) Mealor, Jari Pigg, MD is EP   LABS: Labs reviewed: Acceptable for surgery. (all labs ordered are listed, but only abnormal results are displayed)  Labs Reviewed  GLUCOSE, CAPILLARY - Abnormal; Notable for the following components:      Result Value   Glucose-Capillary 160 (*)    All other components within normal limits  BASIC METABOLIC PANEL - Abnormal; Notable for the following components:   Glucose, Bld 143 (*)    All other components within normal limits  CBC - Abnormal; Notable for the following components:   RBC 4.17 (*)    All other components within normal limits  SURGICAL PCR SCREEN     IMAGES: CXR 11/07/19: IMPRESSION: 1. Well-positioned left anterior chest wall biventricular cardioverter-defibrillator. 2. No acute cardiopulmonary disease.    EKG:  EKG 04/17/23 [facility]): AV dual paced rhythm  EKG 12/06/22:  AV dual-paced rhythm Biventricular pacemaker detected Abnormal ECG When compared with ECG of 08-Dec-2021 09:20, PREVIOUS ECG IS PRESENT  No significant change since last tracing Confirmed by Armanda Magic [phone]) on 12/06/2022 7:29:21 PM   CV: Echo 12/27/19 [facility]): Summary   1. The left ventricle is normal in size with mildly increased wall  thickness.   2. The left ventricular systolic function is overall normal, LVEF is  visually estimated at 50-55%.    3. There is grade I diastolic dysfunction (impaired relaxation).    4. There is moderate aortic  valve stenosis.    5. The left atrium is moderately dilated in size.    6. The right ventricle is normal in size, with normal systolic function.    7. The right atrium is moderately dilated  in size.    8. Overall left ventricular systolic function has normalized in comparison  to the study dated 03/2019. [Comparison LVEF 15-20%, moderate MR, mildly decreased RVSF 03/20/19)  Recommendations   * Continue medical therapy.    RHC/LHC 03/07/19: FINDINGS: Two-vessel CAD with CTO of proximal LAD, 60% mid RCA stenosis, and CTO or proximal rPDA. Widely patent LIMA jump graft to diagonal and LAD. Chronically occluded SVG-RCA (unsure if single conduit or jump graft). Widely patent, ungrafted RIMA. Normal left and right heart filling pressures. Normal Fick cardiac output/index.   RECOMMENDATIONS: Continue medical therapy and secondary prevention. If no bleeding from catheterization sites, okay to begin warfarin this evening.  Past Medical History:  Diagnosis Date   AICD (automatic cardioverter/defibrillator) present    Arthritis    Asthma    CHF (congestive heart failure) (HCC)    Coronary artery disease    Diabetes mellitus without complication (HCC)    Hypertension    Ischemic cardiomyopathy    LBBB (left bundle branch block)    Persistent atrial fibrillation (HCC)     Past Surgical History:  Procedure Laterality Date   BIV ICD INSERTION CRT-D N/A 05/16/2019   Procedure: BIV ICD INSERTION CRT-D;  Surgeon: Hillis Range, MD;  Location: [facility] INVASIVE CV LAB;  Service: Cardiovascular;  Laterality: N/A;   CORONARY ARTERY BYPASS GRAFT  1998   4-vessel   RIGHT/LEFT HEART CATH AND CORONARY/GRAFT ANGIOGRAPHY N/A 03/07/2019   Procedure: RIGHT/LEFT HEART CATH AND CORONARY/GRAFT ANGIOGRAPHY;  Surgeon: Yvonne Kendall, MD;  Location: [facility] INVASIVE CV LAB;  Service: Cardiovascular;  Laterality: N/A;    MEDICATIONS:  acetaminophen (TYLENOL) 500 MG tablet   amLODipine (NORVASC) 10 MG tablet    aspirin EC 81 MG tablet   atorvastatin (LIPITOR) 80 MG tablet   augmented betamethasone dipropionate (DIPROLENE-AF) 0.05 % cream   carvedilol (COREG) 25 MG tablet   Cholecalciferol (VITAMIN D) 50 MCG (2000 UT) tablet   Coenzyme Q10 (CO Q-10) 100 MG CAPS   diclofenac Sodium (VOLTAREN) 1 % GEL   digoxin (LANOXIN) 0.125 MG tablet   dofetilide (TIKOSYN) 500 MCG capsule   glimepiride (AMARYL) 2 MG tablet   GLUCOSAMINE-CHONDROITIN PO   hydrALAZINE (APRESOLINE) 50 MG tablet   MAGnesium-Oxide 400 (240 Mg) MG tablet   metFORMIN (GLUCOPHAGE) 1000 MG tablet   olmesartan (BENICAR) 40 MG tablet   olopatadine (PATADAY) 0.1 % ophthalmic solution   Omega-3 Fatty Acids (FISH OIL) 1000 MG CAPS   torsemide (DEMADEX) 20 MG tablet   warfarin (COUMADIN) 5 MG tablet   No current facility-administered medications for this encounter.    Shonna Chock, PA-C Surgical Short Stay/Anesthesiology [facility] Phone [phone] [facility] Phone [phone] 05/23/2023 6:06 PM

## 2023-05-23 ENCOUNTER — Other Ambulatory Visit: Payer: Self-pay | Admitting: Physician Assistant

## 2023-05-24 ENCOUNTER — Encounter: Payer: Self-pay | Admitting: Physician Assistant

## 2023-05-24 ENCOUNTER — Other Ambulatory Visit: Payer: Self-pay | Admitting: Physician Assistant

## 2023-05-24 ENCOUNTER — Ambulatory Visit (INDEPENDENT_AMBULATORY_CARE_PROVIDER_SITE_OTHER): Payer: Medicare Other | Admitting: Physician Assistant

## 2023-05-24 VITALS — BP 182/82 | HR 60

## 2023-05-24 DIAGNOSIS — M170 Bilateral primary osteoarthritis of knee: Secondary | ICD-10-CM

## 2023-05-24 DIAGNOSIS — M1712 Unilateral primary osteoarthritis, left knee: Secondary | ICD-10-CM | POA: Insufficient documentation

## 2023-05-24 NOTE — H&P (Unsigned)
TOTAL KNEE ADMISSION H&P  Patient is being admitted for left total knee arthroplasty.  Subjective:  Chief Complaint:left knee pain.  HPI: Brian Bruce, 77 y.o. male, has a history of pain and functional disability in the left knee due to arthritis and has failed non-surgical conservative treatments for greater than 12 weeks to includeflexibility and strengthening excercises and use of assistive devices.  Onset of symptoms was gradual, starting 6 years ago with gradually worsening course since that time. The patient noted no past surgery on the left knee(s).  Patient currently rates pain in the left knee(s) at 5 out of 10 with activity. Patient has night pain, pain with passive range of motion, and joint swelling.  Patient has evidence of subchondral cysts and subchondral sclerosis by imaging studies. This patient has had    . There is no active infection.  Patient Active Problem List   Diagnosis Date Noted   Bilateral primary osteoarthritis of knee 07/14/2022   Persistent atrial fibrillation (HCC) 07/01/2019   Hypercoagulable state due to persistent atrial fibrillation (HCC) 07/01/2019   Chronic systolic dysfunction of left ventricle 05/16/2019   Chronic systolic heart failure (HCC) 03/07/2019   Coronary artery disease 03/07/2019   Past Medical History:  Diagnosis Date   AICD (automatic cardioverter/defibrillator) present    Arthritis    Asthma    CHF (congestive heart failure) (HCC)    Coronary artery disease    Diabetes mellitus without complication (HCC)    Hypertension    Ischemic cardiomyopathy    LBBB (left bundle branch block)    Persistent atrial fibrillation (HCC)     Past Surgical History:  Procedure Laterality Date   BIV ICD INSERTION CRT-D N/A 05/16/2019   Procedure: BIV ICD INSERTION CRT-D;  Surgeon: Hillis Range, MD;  Location: [facility];  Service: Cardiovascular;  Laterality: N/A;   CORONARY ARTERY BYPASS GRAFT  1998   4-vessel   RIGHT/LEFT HEART CATH  AND CORONARY/GRAFT ANGIOGRAPHY N/A 03/07/2019   Procedure: RIGHT/LEFT HEART CATH AND CORONARY/GRAFT ANGIOGRAPHY;  Surgeon: Yvonne Kendall, MD;  Location: [facility];  Service: Cardiovascular;  Laterality: N/A;    Current Outpatient Medications  Medication Sig Dispense Refill Last Dose   acetaminophen (TYLENOL) 500 MG tablet Take 500 mg by mouth every 6 (six) hours as needed for moderate pain.      amLODipine (NORVASC) 10 MG tablet Take 1 tablet by mouth daily.      aspirin EC 81 MG tablet Take 81 mg by mouth daily.      atorvastatin (LIPITOR) 80 MG tablet Take 80 mg by mouth every evening.      augmented betamethasone dipropionate (DIPROLENE-AF) 0.05 % cream Apply 1 application topically 2 (two) times daily as needed (rash).      carvedilol (COREG) 25 MG tablet Take 1 tablet by mouth 2 (two) times daily.      Cholecalciferol (VITAMIN D) 50 MCG (2000 UT) tablet Take 1,000 Units by mouth daily.      Coenzyme Q10 (CO Q-10) 100 MG CAPS Take 100 mg by mouth daily.      diclofenac Sodium (VOLTAREN) 1 % GEL Apply 2 g topically 4 (four) times daily as needed (pain).      digoxin (LANOXIN) 0.125 MG tablet Take 1 tablet (125 mcg total) by mouth daily. 90 tablet 3    dofetilide (TIKOSYN) 500 MCG capsule Take 1 capsule (500 mcg total) by mouth 2 (two) times daily. 180 capsule 3    glimepiride (AMARYL) 2  MG tablet Take 1 tablet by mouth daily.      GLUCOSAMINE-CHONDROITIN PO Take 1 tablet by mouth 2 (two) times daily.      hydrALAZINE (APRESOLINE) 50 MG tablet Take 50 mg by mouth 3 (three) times daily.      MAGnesium-Oxide 400 (240 Mg) MG tablet Take 1 tablet by mouth twice daily 180 tablet 3    metFORMIN (GLUCOPHAGE) 1000 MG tablet Take 1 tablet (1,000 mg total) by mouth 2 (two) times daily.      olmesartan (BENICAR) 40 MG tablet Take 40 mg by mouth daily.      olopatadine (PATADAY) 0.1 % ophthalmic solution Place 1 drop into both eyes daily.      Omega-3 Fatty Acids (FISH OIL) 1000 MG CAPS Take  1,000 mg by mouth 3 (three) times daily.      torsemide (DEMADEX) 20 MG tablet Take 1 tablet by mouth once daily 90 tablet 2    warfarin (COUMADIN) 5 MG tablet Take 5-7.5 mg by mouth See admin instructions. Take 5 mg on Tues, Sat, and Sun Take 7.5 mg on Mon, Wed, Thurs, and Fri      No current facility-administered medications for this visit.   No Known Allergies  Social History   Tobacco Use   Smoking status: Former    Current packs/day: 0.00    Types: Cigarettes    Start date: 06/25/1957    Quit date: 1973    Years since quitting: 51.8   Smokeless tobacco: Never   Tobacco comments:    Former smoker 12/08/21  Substance Use Topics   Alcohol use: Yes    Alcohol/week: 14.0 standard drinks of alcohol    Types: 14 Cans of beer per week    Family History  Problem Relation Age of Onset   Cancer Mother    Heart failure Father      Review of Systems  All other systems reviewed and are negative.   Objective:  Physical Exam  Vital signs in last 24 hours: @VSRANGES @  Labs:   Estimated body mass index is 28.63 kg/m as calculated from the following:   Height as of 05/19/23: 5\' 8"  (1.727 m).   Weight as of 05/19/23: 85.4 kg.   Imaging Review Plain radiographs demonstrate moderate degenerative joint disease of the left knee(s). The overall alignment isneutral. The bone quality appears to be good for age and reported activity level.      Assessment/Plan:  End stage arthritis, left knee   The patient history, physical examination, clinical judgment of the provider and imaging studies are consistent with end stage degenerative joint disease of the left knee(s) and total knee arthroplasty is deemed medically necessary. The treatment options including medical management, injection therapy arthroscopy and arthroplasty were discussed at length. The risks and benefits of total knee arthroplasty were presented and reviewed. The risks due to aseptic loosening, infection,  stiffness, patella tracking problems, thromboembolic complications and other imponderables were discussed. The patient acknowledged the explanation, agreed to proceed with the plan and consent was signed. Patient is being admitted for inpatient treatment for surgery, pain control, PT, OT, prophylactic antibiotics, VTE prophylaxis, progressive ambulation and ADL's and discharge planning. The patient is planning to be discharged home with home health services     Patient's anticipated LOS is less than 2 midnights, meeting these requirements: - Younger than 12 - Lives within 1 hour of care - Has a competent adult at home to recover with post-op recover - NO history of  -  Chronic pain requiring opiods  - Diabetes  - Coronary Artery Disease  - Heart failure  - Heart attack  - Stroke  - DVT/VTE  - Cardiac arrhythmia  - Respiratory Failure/COPD  - Renal failure  - Anemia  - Advanced Liver disease

## 2023-05-24 NOTE — H&P (Signed)
TOTAL KNEE ADMISSION H&P  Patient is being admitted for left total knee arthroplasty.  Subjective:  Chief Complaint:left knee pain.  HPI: Brian Bruce, 77 y.o. male, has a history of pain and functional disability in the left knee due to arthritis and has failed non-surgical conservative treatments for greater than 12 weeks to includecorticosteriod injections, use of assistive devices, and activity modification.  Onset of symptoms was gradual, starting 6 years ago with gradually worsening course since that time. The patient noted no past surgery on the left knee(s).  Patient currently rates pain in the left knee(s) at 5 out of 10 with activity. Patient has night pain, worsening of pain with activity and weight bearing, and joint swelling.  Patient has evidence of subchondral cysts and periarticular osteophytes by imaging studies. This patient has had    . There is no active infection.  Patient Active Problem List   Diagnosis Date Noted   Bilateral primary osteoarthritis of knee 07/14/2022   Persistent atrial fibrillation (HCC) 07/01/2019   Hypercoagulable state due to persistent atrial fibrillation (HCC) 07/01/2019   Chronic systolic dysfunction of left ventricle 05/16/2019   Chronic systolic heart failure (HCC) 03/07/2019   Coronary artery disease 03/07/2019   Past Medical History:  Diagnosis Date   AICD (automatic cardioverter/defibrillator) present    Arthritis    Asthma    CHF (congestive heart failure) (HCC)    Coronary artery disease    Diabetes mellitus without complication (HCC)    Hypertension    Ischemic cardiomyopathy    LBBB (left bundle branch block)    Persistent atrial fibrillation (HCC)     Past Surgical History:  Procedure Laterality Date   BIV ICD INSERTION CRT-D N/A 05/16/2019   Procedure: BIV ICD INSERTION CRT-D;  Surgeon: Hillis Range, MD;  Location: [facility];  Service: Cardiovascular;  Laterality: N/A;   CORONARY ARTERY BYPASS GRAFT  1998    4-vessel   RIGHT/LEFT HEART CATH AND CORONARY/GRAFT ANGIOGRAPHY N/A 03/07/2019   Procedure: RIGHT/LEFT HEART CATH AND CORONARY/GRAFT ANGIOGRAPHY;  Surgeon: Yvonne Kendall, MD;  Location: [facility];  Service: Cardiovascular;  Laterality: N/A;    Current Outpatient Medications  Medication Sig Dispense Refill Last Dose   acetaminophen (TYLENOL) 500 MG tablet Take 500 mg by mouth every 6 (six) hours as needed for moderate pain.   Taking   amLODipine (NORVASC) 10 MG tablet Take 1 tablet by mouth daily.   Taking   aspirin EC 81 MG tablet Take 81 mg by mouth daily.   Taking   atorvastatin (LIPITOR) 80 MG tablet Take 80 mg by mouth every evening.   Taking   augmented betamethasone dipropionate (DIPROLENE-AF) 0.05 % cream Apply 1 application topically 2 (two) times daily as needed (rash).   Taking   carvedilol (COREG) 25 MG tablet Take 1 tablet by mouth 2 (two) times daily.   Taking   Cholecalciferol (VITAMIN D) 50 MCG (2000 UT) tablet Take 1,000 Units by mouth daily.   Taking   Coenzyme Q10 (CO Q-10) 100 MG CAPS Take 100 mg by mouth daily.   Taking   diclofenac Sodium (VOLTAREN) 1 % GEL Apply 2 g topically 4 (four) times daily as needed (pain).   Taking   digoxin (LANOXIN) 0.125 MG tablet Take 1 tablet (125 mcg total) by mouth daily. 90 tablet 3 Taking   dofetilide (TIKOSYN) 500 MCG capsule Take 1 capsule (500 mcg total) by mouth 2 (two) times daily. 180 capsule 3 Taking   glimepiride (  AMARYL) 2 MG tablet Take 1 tablet by mouth daily.   Taking   GLUCOSAMINE-CHONDROITIN PO Take 1 tablet by mouth 2 (two) times daily.   Taking   hydrALAZINE (APRESOLINE) 50 MG tablet Take 50 mg by mouth 3 (three) times daily.   Taking   MAGnesium-Oxide 400 (240 Mg) MG tablet Take 1 tablet by mouth twice daily 180 tablet 3 Taking   metFORMIN (GLUCOPHAGE) 1000 MG tablet Take 1 tablet (1,000 mg total) by mouth 2 (two) times daily.   Taking   olmesartan (BENICAR) 40 MG tablet Take 40 mg by mouth daily.   Taking    olopatadine (PATADAY) 0.1 % ophthalmic solution Place 1 drop into both eyes daily.   Taking   Omega-3 Fatty Acids (FISH OIL) 1000 MG CAPS Take 1,000 mg by mouth 3 (three) times daily.   Taking   torsemide (DEMADEX) 20 MG tablet Take 1 tablet by mouth once daily 90 tablet 2 Taking   warfarin (COUMADIN) 5 MG tablet Take 5-7.5 mg by mouth See admin instructions. Take 5 mg on Tues, Sat, and Sun Take 7.5 mg on Mon, Wed, Thurs, and Fri   Taking   No current facility-administered medications for this visit.   No Known Allergies  Social History   Tobacco Use   Smoking status: Former    Current packs/day: 0.00    Types: Cigarettes    Start date: 06/25/1957    Quit date: 1973    Years since quitting: 51.8   Smokeless tobacco: Never   Tobacco comments:    Former smoker 12/08/21  Substance Use Topics   Alcohol use: Yes    Alcohol/week: 14.0 standard drinks of alcohol    Types: 14 Cans of beer per week    Family History  Problem Relation Age of Onset   Cancer Mother    Heart failure Father      Review of Systems  All other systems reviewed and are negative.   Objective:  Physical Exam  Objective: Vital Signs: Ht 5\' 10"  (1.778 m)   Wt 182 lb (82.6 kg)   BMI 26.11 kg/m    Physical Exam Constitutional:      Appearance: He is well-developed.  HENT:     Head: Normocephalic and atraumatic.     Right Ear: External ear normal.     Left Ear: External ear normal.  Eyes:     Pupils: Pupils are equal, round, and reactive to light.  Neck:     Thyroid: No thyromegaly.     Trachea: No tracheal deviation.  Cardiovascular:     Rate and Rhythm: Normal rate.  Pulmonary:     Effort: Pulmonary effort is normal.     Breath sounds: No wheezing.  Abdominal:     General: Bowel sounds are normal.     Palpations: Abdomen is soft.  Musculoskeletal:     Cervical back: Neck supple.  Skin:    General: Skin is warm and dry.     Capillary Refill: Capillary refill takes less than 2 seconds.   Neurological:     Mental Status: He is alert and oriented to person, place, and time.  Psychiatric:        Behavior: Behavior normal.        Thought Content: Thought content normal.        Judgment: Judgment normal.  Ortho Exam crepitus with both knee range of motion.  Knees, then 5 degrees full extension flexion 110 degrees.  Crepitus both knees range of motion  mild varus more medial and lateral joint line tenderness and palpable osteophytes.  Negative logroll to hips distal pulses palpable.  Vital signs in last 24 hours: @VSRANGES @  Labs:   Estimated body mass index is 28.63 kg/m as calculated from the following:   Height as of 05/19/23: 5\' 8"  (1.727 m).   Weight as of 05/19/23: 188 lb 4.8 oz (85.4 kg).   Imaging Review Plain radiographs demonstrate moderate degenerative joint disease of the left knee(s). The overall alignment isneutral. The bone quality appears to be good for age and reported activity level.      Assessment/Plan:  End stage arthritis, left knee   The patient history, physical examination, clinical judgment of the provider and imaging studies are consistent with end stage degenerative joint disease of the left knee(s) and total knee arthroplasty is deemed medically necessary. The treatment options including medical management, injection therapy arthroscopy and arthroplasty were discussed at length. The risks and benefits of total knee arthroplasty were presented and reviewed. The risks due to aseptic loosening, infection, stiffness, patella tracking problems, thromboembolic complications and other imponderables were discussed. The patient acknowledged the explanation, agreed to proceed with the plan and consent was signed. Patient is being admitted for inpatient treatment for surgery, pain control, PT, OT, prophylactic antibiotics, VTE prophylaxis, progressive ambulation and ADL's and discharge planning. The patient is planning to be discharged home with home health  services     Patient's anticipated LOS is less than 2 midnights, meeting these requirements: - Younger than 34 - Lives within 1 hour of care - Has a competent adult at home to recover with post-op recover - NO history of  - Chronic pain requiring opiods  - Diabetes  - Coronary Artery Disease  - Heart failure  - Heart attack  - Stroke  - DVT/VTE  - Cardiac arrhythmia  - Respiratory Failure/COPD  - Renal failure  - Anemia  - Advanced Liver disease

## 2023-05-24 NOTE — Anesthesia Preprocedure Evaluation (Addendum)
Anesthesia Evaluation  Patient identified by MRN, date of birth, ID band Patient awake    Reviewed: Allergy & Precautions, NPO status , Patient's Chart, lab work & pertinent test results  Airway Mallampati: II  TM Distance: >3 FB Neck ROM: Full    Dental  (+) Dental Advisory Given   Pulmonary asthma , former smoker   breath sounds clear to auscultation       Cardiovascular hypertension, Pt. on medications and Pt. on home beta blockers + CAD, + CABG and +CHF  + dysrhythmias Atrial Fibrillation + Cardiac Defibrillator  Rhythm:Regular Rate:Normal     Neuro/Psych negative neurological ROS     GI/Hepatic negative GI ROS, Neg liver ROS,,,  Endo/Other  diabetes, Type 2    Renal/GU negative Renal ROS     Musculoskeletal  (+) Arthritis ,    Abdominal   Peds  Hematology  (+) Blood dyscrasia Last coumadin 10/23. INR 1.1   Anesthesia Other Findings   Reproductive/Obstetrics                             Anesthesia Physical Anesthesia Plan  ASA: 3  Anesthesia Plan: Spinal   Post-op Pain Management: Regional block* and Tylenol PO (pre-op)*   Induction:   PONV Risk Score and Plan: 1 and Propofol infusion, Dexamethasone, Ondansetron and Treatment may vary due to age or medical condition  Airway Management Planned: Natural Airway and Simple Face Mask  Additional Equipment:   Intra-op Plan:   Post-operative Plan:   Informed Consent: I have reviewed the patients History and Physical, chart, labs and discussed the procedure including the risks, benefits and alternatives for the proposed anesthesia with the patient or authorized representative who has indicated his/her understanding and acceptance.       Plan Discussed with: CRNA  Anesthesia Plan Comments: (  )       Anesthesia Quick Evaluation

## 2023-05-24 NOTE — Progress Notes (Signed)
Office Visit Note   Patient: Brian Bruce           Date of Birth: 13-Jan-1946           MRN: [id number] Visit Date: 05/24/2023              Requested by: Toma Deiters, MD [address] [place],  [place] [address] PCP: Toma Deiters, MD  Chief Complaint  Patient presents with   Left Knee - Pre-op Exam      HPI: Patient is a pleasant 77 year old gentleman with a history of osteoarthritis of his left knee.  He has failed conservative treatment.  He does have chronic Coumadin therapy and has been cleared by his cardiologist.  He has failed conservative treatment of his left knee arthritis she has had an extensive discussion with Dr. Ophelia Charter regarding surgical outcomes and expectations  Assessment & Plan: Visit Diagnoses:  1. Bilateral primary osteoarthritis of knee   2. Unilateral primary osteoarthritis, left knee     Plan: Risks of surgery were reviewed with him which include bleeding infection anesthesia complications neurovascular damage need for future surgery he wishes to move forward  Follow-Up Instructions: No follow-ups on file.   Ortho Exam  Patient is alert, oriented, no adenopathy, well-dressed, normal affect, normal respiratory effort.  Objective: Vital Signs: Ht 5\' 10"  (1.778 m)   Wt 182 lb (82.6 kg)   BMI 26.11 kg/m    Physical Exam Constitutional:      Appearance: He is well-developed.  HENT:     Head: Normocephalic and atraumatic.     Right Ear: External ear normal.     Left Ear: External ear normal.  Eyes:     Pupils: Pupils are equal, round, and reactive to light.  Neck:     Thyroid: No thyromegaly.     Trachea: No tracheal deviation.  Cardiovascular:     Rate and Rhythm: Normal rate.  Pulmonary:     Effort: Pulmonary effort is normal.     Breath sounds: No wheezing.  Abdominal:     General: Bowel sounds are normal.     Palpations: Abdomen is soft.  Musculoskeletal:     Cervical back: Neck supple.  Skin:    General: Skin is warm and  dry.     Capillary Refill: Capillary refill takes less than 2 seconds.  Neurological:     Mental Status: He is alert and oriented to person, place, and time.  Psychiatric:        Behavior: Behavior normal.        Thought Content: Thought content normal.        Judgment: Judgment normal.        Ortho Exam crepitus with both knee range of motion.  Knees, then 5 degrees full extension flexion 110 degrees.  Crepitus both knees range of motion mild varus more medial and lateral joint line tenderness and palpable osteophytes.  Negative logroll to hips distal pulses palpable.  Imaging: No results found. No images are attached to the encounter.  Labs: Lab Results  Component Value Date   HGBA1C 6.4 (H) 05/16/2019     No results found for: "ALBUMIN", "PREALBUMIN", "CBC"  Lab Results  Component Value Date   MG 2.0 12/06/2022   MG 2.1 06/07/2022   MG 2.0 12/08/2021   No results found for: "VD25OH"  No results found for: "PREALBUMIN"    Latest Ref Rng & Units 05/19/2023   10:00 AM 06/07/2022   11:54 AM  05/13/2019   10:33 AM  CBC EXTENDED  WBC 4.0 - 10.5 K/uL 9.8  7.0  5.6   RBC 4.22 - 5.81 MIL/uL 4.17  3.97  3.65   Hemoglobin 13.0 - 17.0 g/dL 16.1  09.6  04.5   HCT 39.0 - 52.0 % 39.1  37.0  35.0   Platelets 150 - 400 K/uL 291  292  261      There is no height or weight on file to calculate BMI.  Orders:  No orders of the defined types were placed in this encounter.  No orders of the defined types were placed in this encounter.    Procedures: No procedures performed  Clinical Data: No additional findings.  ROS:  All other systems negative, except as noted in the HPI. Review of Systems  Objective: Vital Signs: BP (!) 182/82   Pulse 60   Specialty Comments:  No specialty comments available.  PMFS History: Patient Active Problem List   Diagnosis Date Noted   Unilateral primary osteoarthritis, left knee 05/24/2023   Bilateral primary osteoarthritis of knee  07/14/2022   Persistent atrial fibrillation (HCC) 07/01/2019   Hypercoagulable state due to persistent atrial fibrillation (HCC) 07/01/2019   Chronic systolic dysfunction of left ventricle 05/16/2019   Chronic systolic heart failure (HCC) 03/07/2019   Coronary artery disease 03/07/2019   Past Medical History:  Diagnosis Date   AICD (automatic cardioverter/defibrillator) present    Arthritis    Asthma    CHF (congestive heart failure) (HCC)    Coronary artery disease    Diabetes mellitus without complication (HCC)    Hypertension    Ischemic cardiomyopathy    LBBB (left bundle branch block)    Persistent atrial fibrillation (HCC)     Family History  Problem Relation Age of Onset   Cancer Mother    Heart failure Father     Past Surgical History:  Procedure Laterality Date   BIV ICD INSERTION CRT-D N/A 05/16/2019   Procedure: BIV ICD INSERTION CRT-D;  Surgeon: Hillis Range, MD;  Location: [facility];  Service: Cardiovascular;  Laterality: N/A;   CORONARY ARTERY BYPASS GRAFT  1998   4-vessel   RIGHT/LEFT HEART CATH AND CORONARY/GRAFT ANGIOGRAPHY N/A 03/07/2019   Procedure: RIGHT/LEFT HEART CATH AND CORONARY/GRAFT ANGIOGRAPHY;  Surgeon: Yvonne Kendall, MD;  Location: [facility];  Service: Cardiovascular;  Laterality: N/A;   Social History   Occupational History   Not on file  Tobacco Use   Smoking status: Former    Current packs/day: 0.00    Types: Cigarettes    Start date: 06/25/1957    Quit date: 1973    Years since quitting: 51.8   Smokeless tobacco: Never   Tobacco comments:    Former smoker 12/08/21  Vaping Use   Vaping status: Never Used  Substance and Sexual Activity   Alcohol use: Yes    Alcohol/week: 14.0 standard drinks of alcohol    Types: 14 Cans of beer per week   Drug use: Never   Sexual activity: Not on file

## 2023-05-24 NOTE — H&P (View-Only) (Signed)
TOTAL KNEE ADMISSION H&P  Patient is being admitted for left total knee arthroplasty.  Subjective:  Chief Complaint:left knee pain.  HPI: Brian Bruce, 77 y.o. male, has a history of pain and functional disability in the left knee due to arthritis and has failed non-surgical conservative treatments for greater than 12 weeks to includecorticosteriod injections, use of assistive devices, and activity modification.  Onset of symptoms was gradual, starting 6 years ago with gradually worsening course since that time. The patient noted no past surgery on the left knee(s).  Patient currently rates pain in the left knee(s) at 5 out of 10 with activity. Patient has night pain, worsening of pain with activity and weight bearing, and joint swelling.  Patient has evidence of subchondral cysts and periarticular osteophytes by imaging studies. This patient has had    . There is no active infection.  Patient Active Problem List   Diagnosis Date Noted   Bilateral primary osteoarthritis of knee 07/14/2022   Persistent atrial fibrillation (HCC) 07/01/2019   Hypercoagulable state due to persistent atrial fibrillation (HCC) 07/01/2019   Chronic systolic dysfunction of left ventricle 05/16/2019   Chronic systolic heart failure (HCC) 03/07/2019   Coronary artery disease 03/07/2019   Past Medical History:  Diagnosis Date   AICD (automatic cardioverter/defibrillator) present    Arthritis    Asthma    CHF (congestive heart failure) (HCC)    Coronary artery disease    Diabetes mellitus without complication (HCC)    Hypertension    Ischemic cardiomyopathy    LBBB (left bundle branch block)    Persistent atrial fibrillation (HCC)     Past Surgical History:  Procedure Laterality Date   BIV ICD INSERTION CRT-D N/A 05/16/2019   Procedure: BIV ICD INSERTION CRT-D;  Surgeon: Hillis Range, MD;  Location: [facility];  Service: Cardiovascular;  Laterality: N/A;   CORONARY ARTERY BYPASS GRAFT  1998    4-vessel   RIGHT/LEFT HEART CATH AND CORONARY/GRAFT ANGIOGRAPHY N/A 03/07/2019   Procedure: RIGHT/LEFT HEART CATH AND CORONARY/GRAFT ANGIOGRAPHY;  Surgeon: Yvonne Kendall, MD;  Location: [facility];  Service: Cardiovascular;  Laterality: N/A;    Current Outpatient Medications  Medication Sig Dispense Refill Last Dose   acetaminophen (TYLENOL) 500 MG tablet Take 500 mg by mouth every 6 (six) hours as needed for moderate pain.   Taking   amLODipine (NORVASC) 10 MG tablet Take 1 tablet by mouth daily.   Taking   aspirin EC 81 MG tablet Take 81 mg by mouth daily.   Taking   atorvastatin (LIPITOR) 80 MG tablet Take 80 mg by mouth every evening.   Taking   augmented betamethasone dipropionate (DIPROLENE-AF) 0.05 % cream Apply 1 application topically 2 (two) times daily as needed (rash).   Taking   carvedilol (COREG) 25 MG tablet Take 1 tablet by mouth 2 (two) times daily.   Taking   Cholecalciferol (VITAMIN D) 50 MCG (2000 UT) tablet Take 1,000 Units by mouth daily.   Taking   Coenzyme Q10 (CO Q-10) 100 MG CAPS Take 100 mg by mouth daily.   Taking   diclofenac Sodium (VOLTAREN) 1 % GEL Apply 2 g topically 4 (four) times daily as needed (pain).   Taking   digoxin (LANOXIN) 0.125 MG tablet Take 1 tablet (125 mcg total) by mouth daily. 90 tablet 3 Taking   dofetilide (TIKOSYN) 500 MCG capsule Take 1 capsule (500 mcg total) by mouth 2 (two) times daily. 180 capsule 3 Taking   glimepiride (  AMARYL) 2 MG tablet Take 1 tablet by mouth daily.   Taking   GLUCOSAMINE-CHONDROITIN PO Take 1 tablet by mouth 2 (two) times daily.   Taking   hydrALAZINE (APRESOLINE) 50 MG tablet Take 50 mg by mouth 3 (three) times daily.   Taking   MAGnesium-Oxide 400 (240 Mg) MG tablet Take 1 tablet by mouth twice daily 180 tablet 3 Taking   metFORMIN (GLUCOPHAGE) 1000 MG tablet Take 1 tablet (1,000 mg total) by mouth 2 (two) times daily.   Taking   olmesartan (BENICAR) 40 MG tablet Take 40 mg by mouth daily.   Taking    olopatadine (PATADAY) 0.1 % ophthalmic solution Place 1 drop into both eyes daily.   Taking   Omega-3 Fatty Acids (FISH OIL) 1000 MG CAPS Take 1,000 mg by mouth 3 (three) times daily.   Taking   torsemide (DEMADEX) 20 MG tablet Take 1 tablet by mouth once daily 90 tablet 2 Taking   warfarin (COUMADIN) 5 MG tablet Take 5-7.5 mg by mouth See admin instructions. Take 5 mg on Tues, Sat, and Sun Take 7.5 mg on Mon, Wed, Thurs, and Fri   Taking   No current facility-administered medications for this visit.   No Known Allergies  Social History   Tobacco Use   Smoking status: Former    Current packs/day: 0.00    Types: Cigarettes    Start date: 06/25/1957    Quit date: 1973    Years since quitting: 51.8   Smokeless tobacco: Never   Tobacco comments:    Former smoker 12/08/21  Substance Use Topics   Alcohol use: Yes    Alcohol/week: 14.0 standard drinks of alcohol    Types: 14 Cans of beer per week    Family History  Problem Relation Age of Onset   Cancer Mother    Heart failure Father      Review of Systems  All other systems reviewed and are negative.   Objective:  Physical Exam  Objective: Vital Signs: Ht 5\' 10"  (1.778 m)   Wt 182 lb (82.6 kg)   BMI 26.11 kg/m    Physical Exam Constitutional:      Appearance: He is well-developed.  HENT:     Head: Normocephalic and atraumatic.     Right Ear: External ear normal.     Left Ear: External ear normal.  Eyes:     Pupils: Pupils are equal, round, and reactive to light.  Neck:     Thyroid: No thyromegaly.     Trachea: No tracheal deviation.  Cardiovascular:     Rate and Rhythm: Normal rate.  Pulmonary:     Effort: Pulmonary effort is normal.     Breath sounds: No wheezing.  Abdominal:     General: Bowel sounds are normal.     Palpations: Abdomen is soft.  Musculoskeletal:     Cervical back: Neck supple.  Skin:    General: Skin is warm and dry.     Capillary Refill: Capillary refill takes less than 2 seconds.   Neurological:     Mental Status: He is alert and oriented to person, place, and time.  Psychiatric:        Behavior: Behavior normal.        Thought Content: Thought content normal.        Judgment: Judgment normal.  Ortho Exam crepitus with both knee range of motion.  Knees, then 5 degrees full extension flexion 110 degrees.  Crepitus both knees range of motion  mild varus more medial and lateral joint line tenderness and palpable osteophytes.  Negative logroll to hips distal pulses palpable.  Vital signs in last 24 hours: @VSRANGES @  Labs:   Estimated body mass index is 28.63 kg/m as calculated from the following:   Height as of 05/19/23: 5\' 8"  (1.727 m).   Weight as of 05/19/23: 188 lb 4.8 oz (85.4 kg).   Imaging Review Plain radiographs demonstrate moderate degenerative joint disease of the left knee(s). The overall alignment isneutral. The bone quality appears to be good for age and reported activity level.      Assessment/Plan:  End stage arthritis, left knee   The patient history, physical examination, clinical judgment of the provider and imaging studies are consistent with end stage degenerative joint disease of the left knee(s) and total knee arthroplasty is deemed medically necessary. The treatment options including medical management, injection therapy arthroscopy and arthroplasty were discussed at length. The risks and benefits of total knee arthroplasty were presented and reviewed. The risks due to aseptic loosening, infection, stiffness, patella tracking problems, thromboembolic complications and other imponderables were discussed. The patient acknowledged the explanation, agreed to proceed with the plan and consent was signed. Patient is being admitted for inpatient treatment for surgery, pain control, PT, OT, prophylactic antibiotics, VTE prophylaxis, progressive ambulation and ADL's and discharge planning. The patient is planning to be discharged home with home health  services     Patient's anticipated LOS is less than 2 midnights, meeting these requirements: - Younger than 34 - Lives within 1 hour of care - Has a competent adult at home to recover with post-op recover - NO history of  - Chronic pain requiring opiods  - Diabetes  - Coronary Artery Disease  - Heart failure  - Heart attack  - Stroke  - DVT/VTE  - Cardiac arrhythmia  - Respiratory Failure/COPD  - Renal failure  - Anemia  - Advanced Liver disease

## 2023-05-25 NOTE — Progress Notes (Signed)
Remote ICD transmission.   

## 2023-05-26 ENCOUNTER — Telehealth: Payer: Self-pay | Admitting: *Deleted

## 2023-05-26 NOTE — Care Plan (Signed)
Met with patient in office during his H&P appointment with Dr. Ophelia Charter' PA to discuss his upcoming Left total knee arthroplasty on 05/29/23 at [facility]. He is agreeable to case management. He had several sons and a brother that attended the pre-op appointment and reviewed all information. Plan is to return home after surgery with assistance from family. Has a standard RW. Anticipate HHPT will be needed after a short hospital stay. Referral to [facility] after choice provided. Reviewed all post op care instructions. Will continue to follow for needs. Did discuss his cardiology appointment and clearance obtained from Dr. Foy Guadalajara with [facility] on 04/28/23. OK to hold Warfarin 5 days prior to procedure. No bridging required after RNCM contacted their office and received this verbal from Dr. Pablo Lawrence NP. Discussed with Dr. Ophelia Charter as well. OK to continue with 81 mg Aspirin prior to procedure. Patient is aware.

## 2023-05-26 NOTE — Telephone Encounter (Signed)
Ortho bundle pre-op call completed. 

## 2023-05-29 ENCOUNTER — Observation Stay (HOSPITAL_COMMUNITY)
Admission: RE | Admit: 2023-05-29 | Discharge: 2023-05-30 | Disposition: A | Discharge status: Home / Self Care | Payer: Medicare Other | Attending: Orthopaedic Surgery | Admitting: Orthopaedic Surgery

## 2023-05-29 ENCOUNTER — Other Ambulatory Visit: Payer: Self-pay

## 2023-05-29 ENCOUNTER — Ambulatory Visit (HOSPITAL_COMMUNITY): Payer: Medicare Other | Admitting: Vascular Surgery

## 2023-05-29 ENCOUNTER — Encounter (HOSPITAL_COMMUNITY): Payer: Self-pay | Admitting: Orthopaedic Surgery

## 2023-05-29 ENCOUNTER — Ambulatory Visit (HOSPITAL_COMMUNITY): Payer: Medicare Other | Admitting: Anesthesiology

## 2023-05-29 ENCOUNTER — Encounter (HOSPITAL_COMMUNITY)
Admission: RE | Disposition: A | Discharge status: Home / Self Care | Payer: Self-pay | Source: Home / Self Care | Attending: Orthopaedic Surgery

## 2023-05-29 DIAGNOSIS — Z87891 Personal history of nicotine dependence: Secondary | ICD-10-CM | POA: Diagnosis not present

## 2023-05-29 DIAGNOSIS — I2510 Atherosclerotic heart disease of native coronary artery without angina pectoris: Secondary | ICD-10-CM | POA: Insufficient documentation

## 2023-05-29 DIAGNOSIS — M179 Osteoarthritis of knee, unspecified: Secondary | ICD-10-CM | POA: Insufficient documentation

## 2023-05-29 DIAGNOSIS — G8918 Other acute postprocedural pain: Secondary | ICD-10-CM | POA: Diagnosis not present

## 2023-05-29 DIAGNOSIS — Z7901 Long term (current) use of anticoagulants: Secondary | ICD-10-CM | POA: Diagnosis not present

## 2023-05-29 DIAGNOSIS — Z7982 Long term (current) use of aspirin: Secondary | ICD-10-CM | POA: Diagnosis not present

## 2023-05-29 DIAGNOSIS — Z951 Presence of aortocoronary bypass graft: Secondary | ICD-10-CM | POA: Insufficient documentation

## 2023-05-29 DIAGNOSIS — I5022 Chronic systolic (congestive) heart failure: Secondary | ICD-10-CM | POA: Insufficient documentation

## 2023-05-29 DIAGNOSIS — M1712 Unilateral primary osteoarthritis, left knee: Secondary | ICD-10-CM | POA: Diagnosis not present

## 2023-05-29 DIAGNOSIS — Z01818 Encounter for other preprocedural examination: Principal | ICD-10-CM

## 2023-05-29 DIAGNOSIS — I4819 Other persistent atrial fibrillation: Secondary | ICD-10-CM | POA: Insufficient documentation

## 2023-05-29 DIAGNOSIS — I110 Hypertensive heart disease with heart failure: Secondary | ICD-10-CM | POA: Diagnosis not present

## 2023-05-29 DIAGNOSIS — Z79899 Other long term (current) drug therapy: Secondary | ICD-10-CM | POA: Insufficient documentation

## 2023-05-29 DIAGNOSIS — J45909 Unspecified asthma, uncomplicated: Secondary | ICD-10-CM | POA: Insufficient documentation

## 2023-05-29 DIAGNOSIS — Z7984 Long term (current) use of oral hypoglycemic drugs: Secondary | ICD-10-CM | POA: Diagnosis not present

## 2023-05-29 DIAGNOSIS — E119 Type 2 diabetes mellitus without complications: Secondary | ICD-10-CM | POA: Diagnosis not present

## 2023-05-29 DIAGNOSIS — Z96652 Presence of left artificial knee joint: Secondary | ICD-10-CM

## 2023-05-29 HISTORY — PX: TOTAL KNEE ARTHROPLASTY: SHX125

## 2023-05-29 LAB — GLUCOSE, CAPILLARY
Glucose-Capillary: 179 mg/dL — ABNORMAL HIGH (ref 70–99)
Glucose-Capillary: 137 mg/dL — ABNORMAL HIGH (ref 70–99)
Glucose-Capillary: 140 mg/dL — ABNORMAL HIGH (ref 70–99)
Glucose-Capillary: 151 mg/dL — ABNORMAL HIGH (ref 70–99)
Glucose-Capillary: 357 mg/dL — ABNORMAL HIGH (ref 70–99)

## 2023-05-29 LAB — APTT: aPTT: 30 s (ref 24–36)

## 2023-05-29 LAB — PROTIME-INR
INR: 1.1 (ref 0.8–1.2)
Prothrombin Time: 13.9 s (ref 11.4–15.2)

## 2023-05-29 SURGERY — ARTHROPLASTY, KNEE, TOTAL
Anesthesia: Spinal | Site: Knee | Laterality: Left

## 2023-05-29 MED ORDER — OLOPATADINE HCL 0.1 % OP SOLN
1.0000 [drp] | Freq: Every day | OPHTHALMIC | Status: DC
Start: 2023-05-30 — End: 2023-05-30
  Administered 2023-05-30: 1 [drp] via OPHTHALMIC
  Filled 2023-05-29: qty 5

## 2023-05-29 MED ORDER — METHOCARBAMOL 500 MG PO TABS
500.0000 mg | ORAL_TABLET | Freq: Four times a day (QID) | ORAL | Status: DC | PRN
  Administered 2023-05-29 – 2023-05-30 (×2): 500 mg via ORAL
  Filled 2023-05-29: qty 1

## 2023-05-29 MED ORDER — ACETAMINOPHEN 500 MG PO TABS
1000.0000 mg | ORAL_TABLET | Freq: Once | ORAL | Status: AC
  Administered 2023-05-29: 1000 mg via ORAL
  Filled 2023-05-29: qty 2

## 2023-05-29 MED ORDER — MIDAZOLAM HCL 2 MG/2ML IJ SOLN
INTRAMUSCULAR | Status: AC
  Administered 2023-05-29: 0.5 mg via INTRAVENOUS
  Filled 2023-05-29: qty 2

## 2023-05-29 MED ORDER — BUPIVACAINE IN DEXTROSE 0.75-8.25 % IT SOLN
INTRATHECAL | Status: DC | PRN
  Administered 2023-05-29: 1.6 mL via INTRATHECAL

## 2023-05-29 MED ORDER — OMEGA-3-ACID ETHYL ESTERS 1 G PO CAPS
1.0000 g | ORAL_CAPSULE | Freq: Every day | ORAL | Status: DC
  Administered 2023-05-30: 1 g via ORAL
  Filled 2023-05-29: qty 1

## 2023-05-29 MED ORDER — MAGNESIUM OXIDE -MG SUPPLEMENT 400 (240 MG) MG PO TABS
400.0000 mg | ORAL_TABLET | Freq: Two times a day (BID) | ORAL | Status: DC
  Administered 2023-05-29 – 2023-05-30 (×2): 400 mg via ORAL
  Filled 2023-05-29 (×2): qty 1

## 2023-05-29 MED ORDER — DIGOXIN 125 MCG PO TABS
125.0000 ug | ORAL_TABLET | Freq: Every day | ORAL | Status: DC
  Administered 2023-05-30: 125 ug via ORAL
  Filled 2023-05-29: qty 1

## 2023-05-29 MED ORDER — GLIMEPIRIDE 2 MG PO TABS
2.0000 mg | ORAL_TABLET | Freq: Every day | ORAL | Status: DC
  Administered 2023-05-30: 2 mg via ORAL
  Filled 2023-05-29: qty 1

## 2023-05-29 MED ORDER — CARVEDILOL 25 MG PO TABS
25.0000 mg | ORAL_TABLET | Freq: Two times a day (BID) | ORAL | Status: DC
  Administered 2023-05-29 – 2023-05-30 (×2): 25 mg via ORAL
  Filled 2023-05-29 (×2): qty 1

## 2023-05-29 MED ORDER — MENTHOL 3 MG MT LOZG: 1.0000 | LOZENGE | OROMUCOSAL | Status: DC | PRN

## 2023-05-29 MED ORDER — ATORVASTATIN CALCIUM 80 MG PO TABS
80.0000 mg | ORAL_TABLET | Freq: Every evening | ORAL | Status: DC
  Administered 2023-05-29: 80 mg via ORAL
  Filled 2023-05-29: qty 1

## 2023-05-29 MED ORDER — WARFARIN SODIUM 7.5 MG PO TABS
7.5000 mg | ORAL_TABLET | ORAL | Status: DC
  Administered 2023-05-29: 7.5 mg via ORAL
  Filled 2023-05-29: qty 1

## 2023-05-29 MED ORDER — 0.9 % SODIUM CHLORIDE (POUR BTL) OPTIME
TOPICAL | Status: DC | PRN
  Administered 2023-05-29: 1000 mL

## 2023-05-29 MED ORDER — PROPOFOL 10 MG/ML IV BOLUS
INTRAVENOUS | Status: AC
  Filled 2023-05-29: qty 20

## 2023-05-29 MED ORDER — BUPIVACAINE LIPOSOME 1.3 % IJ SUSP
INTRAMUSCULAR | Status: AC
  Filled 2023-05-29: qty 20

## 2023-05-29 MED ORDER — DIPHENHYDRAMINE HCL 12.5 MG/5ML PO ELIX: 12.5000 mg | ORAL_SOLUTION | ORAL | Status: DC | PRN

## 2023-05-29 MED ORDER — ONDANSETRON HCL 4 MG/2ML IJ SOLN
INTRAMUSCULAR | Status: DC | PRN
  Administered 2023-05-29: 4 mg via INTRAVENOUS

## 2023-05-29 MED ORDER — ACETAMINOPHEN 500 MG PO TABS
500.0000 mg | ORAL_TABLET | Freq: Four times a day (QID) | ORAL | Status: DC | PRN
  Administered 2023-05-29 – 2023-05-30 (×2): 500 mg via ORAL
  Filled 2023-05-29 (×2): qty 1

## 2023-05-29 MED ORDER — AMLODIPINE BESYLATE 10 MG PO TABS
10.0000 mg | ORAL_TABLET | Freq: Every day | ORAL | Status: DC
  Administered 2023-05-30: 10 mg via ORAL
  Filled 2023-05-29: qty 1

## 2023-05-29 MED ORDER — PROPOFOL 10 MG/ML IV BOLUS
INTRAVENOUS | Status: DC | PRN
  Administered 2023-05-29: 20 mg via INTRAVENOUS
  Administered 2023-05-29: 40 mg via INTRAVENOUS

## 2023-05-29 MED ORDER — BUPIVACAINE HCL (PF) 0.25 % IJ SOLN
INTRAMUSCULAR | Status: AC
  Filled 2023-05-29: qty 20

## 2023-05-29 MED ORDER — DOCUSATE SODIUM 100 MG PO CAPS
100.0000 mg | ORAL_CAPSULE | Freq: Two times a day (BID) | ORAL | Status: DC
  Administered 2023-05-29 – 2023-05-30 (×2): 100 mg via ORAL
  Filled 2023-05-29 (×2): qty 1

## 2023-05-29 MED ORDER — OXYCODONE HCL 5 MG PO TABS
10.0000 mg | ORAL_TABLET | ORAL | Status: DC | PRN
  Administered 2023-05-29: 10 mg via ORAL

## 2023-05-29 MED ORDER — LIDOCAINE HCL (CARDIAC) PF 100 MG/5ML IV SOSY
PREFILLED_SYRINGE | INTRAVENOUS | Status: DC | PRN
  Administered 2023-05-29: 40 mg via INTRAVENOUS

## 2023-05-29 MED ORDER — CO Q-10 100 MG PO CAPS: 100.0000 mg | ORAL_CAPSULE | Freq: Every day | ORAL | Status: DC

## 2023-05-29 MED ORDER — MIDAZOLAM HCL 2 MG/2ML IJ SOLN: 0.5000 mg | Freq: Once | INTRAMUSCULAR | Status: AC

## 2023-05-29 MED ORDER — TORSEMIDE 20 MG PO TABS
20.0000 mg | ORAL_TABLET | Freq: Every day | ORAL | Status: DC
  Administered 2023-05-29 – 2023-05-30 (×2): 20 mg via ORAL
  Filled 2023-05-29 (×2): qty 1

## 2023-05-29 MED ORDER — DOFETILIDE 500 MCG PO CAPS
500.0000 ug | ORAL_CAPSULE | Freq: Two times a day (BID) | ORAL | Status: DC
  Administered 2023-05-29 – 2023-05-30 (×2): 500 ug via ORAL
  Filled 2023-05-29 (×3): qty 1

## 2023-05-29 MED ORDER — BUPIVACAINE HCL (PF) 0.25 % IJ SOLN
INTRAMUSCULAR | Status: DC | PRN
  Administered 2023-05-29: 20 mL

## 2023-05-29 MED ORDER — FENTANYL CITRATE (PF) 100 MCG/2ML IJ SOLN
25.0000 ug | INTRAMUSCULAR | Status: DC | PRN
Start: 2023-05-29 — End: 2023-05-29

## 2023-05-29 MED ORDER — PROPOFOL 1000 MG/100ML IV EMUL
INTRAVENOUS | Status: AC
  Filled 2023-05-29: qty 100

## 2023-05-29 MED ORDER — ROPIVACAINE HCL 5 MG/ML IJ SOLN
INTRAMUSCULAR | Status: DC | PRN
  Administered 2023-05-29: 20 mL via PERINEURAL

## 2023-05-29 MED ORDER — CHLORHEXIDINE GLUCONATE 0.12 % MT SOLN: 15.0000 mL | Freq: Once | OROMUCOSAL | Status: DC

## 2023-05-29 MED ORDER — PHENOL 1.4 % MT LIQD: 1.0000 | OROMUCOSAL | Status: DC | PRN

## 2023-05-29 MED ORDER — PROPOFOL 500 MG/50ML IV EMUL
INTRAVENOUS | Status: DC | PRN
  Administered 2023-05-29: 85 ug/kg/min via INTRAVENOUS
  Administered 2023-05-29: 75 ug/kg/min via INTRAVENOUS

## 2023-05-29 MED ORDER — LIDOCAINE 2% (20 MG/ML) 5 ML SYRINGE
INTRAMUSCULAR | Status: AC
  Filled 2023-05-29: qty 5

## 2023-05-29 MED ORDER — CEFAZOLIN SODIUM-DEXTROSE 2-4 GM/100ML-% IV SOLN
INTRAVENOUS | Status: AC
  Filled 2023-05-29: qty 100

## 2023-05-29 MED ORDER — LACTATED RINGERS IV SOLN: INTRAVENOUS | Status: DC

## 2023-05-29 MED ORDER — POLYETHYLENE GLYCOL 3350 17 G PO PACK
17.0000 g | PACK | Freq: Every day | ORAL | Status: DC | PRN
Start: 2023-05-29 — End: 2023-05-30

## 2023-05-29 MED ORDER — WARFARIN SODIUM 5 MG PO TABS: 5.0000 mg | ORAL_TABLET | ORAL | Status: DC

## 2023-05-29 MED ORDER — ORAL CARE MOUTH RINSE: 15.0000 mL | Freq: Once | OROMUCOSAL | Status: DC

## 2023-05-29 MED ORDER — METOCLOPRAMIDE HCL 5 MG/ML IJ SOLN: 5.0000 mg | Freq: Three times a day (TID) | INTRAMUSCULAR | Status: DC | PRN

## 2023-05-29 MED ORDER — SODIUM CHLORIDE 0.9 % IR SOLN
Status: DC | PRN
  Administered 2023-05-29: 1000 mL

## 2023-05-29 MED ORDER — CLONIDINE HCL (ANALGESIA) 100 MCG/ML EP SOLN
EPIDURAL | Status: DC | PRN
  Administered 2023-05-29: 50 ug

## 2023-05-29 MED ORDER — ONDANSETRON HCL 4 MG PO TABS: 4.0000 mg | ORAL_TABLET | Freq: Four times a day (QID) | ORAL | Status: DC | PRN

## 2023-05-29 MED ORDER — HYDRALAZINE HCL 50 MG PO TABS
50.0000 mg | ORAL_TABLET | Freq: Three times a day (TID) | ORAL | Status: DC
  Administered 2023-05-29 – 2023-05-30 (×3): 50 mg via ORAL
  Filled 2023-05-29 (×3): qty 1

## 2023-05-29 MED ORDER — ORAL CARE MOUTH RINSE: 15.0000 mL | Freq: Once | OROMUCOSAL | Status: AC

## 2023-05-29 MED ORDER — TRIAMCINOLONE ACETONIDE 0.5 % EX CREA
TOPICAL_CREAM | Freq: Two times a day (BID) | CUTANEOUS | Status: DC
  Filled 2023-05-29: qty 15

## 2023-05-29 MED ORDER — IRBESARTAN 300 MG PO TABS
300.0000 mg | ORAL_TABLET | Freq: Every day | ORAL | Status: DC
  Administered 2023-05-29 – 2023-05-30 (×2): 300 mg via ORAL
  Filled 2023-05-29 (×2): qty 1

## 2023-05-29 MED ORDER — WARFARIN - PHYSICIAN DOSING INPATIENT: Freq: Every day | Status: DC

## 2023-05-29 MED ORDER — VITAMIN D 25 MCG (1000 UNIT) PO TABS
1000.0000 [IU] | ORAL_TABLET | Freq: Every day | ORAL | Status: DC
  Administered 2023-05-30: 1000 [IU] via ORAL
  Filled 2023-05-29 (×2): qty 1

## 2023-05-29 MED ORDER — ONDANSETRON HCL 4 MG/2ML IJ SOLN: 4.0000 mg | Freq: Four times a day (QID) | INTRAMUSCULAR | Status: DC | PRN

## 2023-05-29 MED ORDER — CELECOXIB 200 MG PO CAPS
200.0000 mg | ORAL_CAPSULE | Freq: Two times a day (BID) | ORAL | Status: AC
  Administered 2023-05-29: 200 mg via ORAL
  Filled 2023-05-29: qty 1

## 2023-05-29 MED ORDER — FENTANYL CITRATE (PF) 100 MCG/2ML IJ SOLN: 50.0000 ug | Freq: Once | INTRAMUSCULAR | Status: AC

## 2023-05-29 MED ORDER — AMISULPRIDE (ANTIEMETIC) 5 MG/2ML IV SOLN
10.0000 mg | Freq: Once | INTRAVENOUS | Status: DC | PRN
Start: 2023-05-29 — End: 2023-05-29

## 2023-05-29 MED ORDER — OXYCODONE HCL 5 MG PO TABS
5.0000 mg | ORAL_TABLET | ORAL | Status: DC | PRN
  Filled 2023-05-29: qty 2

## 2023-05-29 MED ORDER — METOCLOPRAMIDE HCL 5 MG PO TABS: 5.0000 mg | ORAL_TABLET | Freq: Three times a day (TID) | ORAL | Status: DC | PRN

## 2023-05-29 MED ORDER — BUPIVACAINE LIPOSOME 1.3 % IJ SUSP
INTRAMUSCULAR | Status: DC | PRN
  Administered 2023-05-29: 20 mL

## 2023-05-29 MED ORDER — FENTANYL CITRATE (PF) 100 MCG/2ML IJ SOLN
INTRAMUSCULAR | Status: AC
  Administered 2023-05-29: 50 ug via INTRAVENOUS
  Filled 2023-05-29: qty 2

## 2023-05-29 MED ORDER — METFORMIN HCL 500 MG PO TABS
1000.0000 mg | ORAL_TABLET | Freq: Two times a day (BID) | ORAL | Status: DC
  Administered 2023-05-30: 1000 mg via ORAL
  Filled 2023-05-29: qty 2

## 2023-05-29 MED ORDER — HYDROMORPHONE HCL 1 MG/ML IJ SOLN: 0.5000 mg | INTRAMUSCULAR | Status: DC | PRN

## 2023-05-29 MED ORDER — DEXAMETHASONE SODIUM PHOSPHATE 10 MG/ML IJ SOLN
INTRAMUSCULAR | Status: DC | PRN
  Administered 2023-05-29: 10 mg via INTRAVENOUS

## 2023-05-29 MED ORDER — TRANEXAMIC ACID-NACL 1000-0.7 MG/100ML-% IV SOLN
1000.0000 mg | INTRAVENOUS | Status: AC
  Administered 2023-05-29: 1000 mg via INTRAVENOUS

## 2023-05-29 MED ORDER — DEXTROSE-SODIUM CHLORIDE 5-0.45 % IV SOLN
INTRAVENOUS | Status: DC
  Administered 2023-05-29: 980 mL via INTRAVENOUS

## 2023-05-29 MED ORDER — INSULIN ASPART 100 UNIT/ML IJ SOLN: 0.0000 [IU] | INTRAMUSCULAR | Status: DC | PRN

## 2023-05-29 MED ORDER — CHLORHEXIDINE GLUCONATE 0.12 % MT SOLN: 15.0000 mL | Freq: Once | OROMUCOSAL | Status: AC

## 2023-05-29 MED ORDER — TRANEXAMIC ACID-NACL 1000-0.7 MG/100ML-% IV SOLN
INTRAVENOUS | Status: AC
  Filled 2023-05-29: qty 100

## 2023-05-29 MED ORDER — DICLOFENAC SODIUM 1 % EX GEL: 2.0000 g | Freq: Four times a day (QID) | CUTANEOUS | Status: DC | PRN

## 2023-05-29 MED ORDER — METHOCARBAMOL 500 MG PO TABS
ORAL_TABLET | ORAL | Status: AC
  Filled 2023-05-29: qty 1

## 2023-05-29 MED ORDER — METHOCARBAMOL 1000 MG/10ML IJ SOLN: 500.0000 mg | Freq: Four times a day (QID) | INTRAMUSCULAR | Status: DC | PRN

## 2023-05-29 MED ORDER — CHLORHEXIDINE GLUCONATE 0.12 % MT SOLN
OROMUCOSAL | Status: AC
  Administered 2023-05-29: 15 mL via OROMUCOSAL
  Filled 2023-05-29: qty 15

## 2023-05-29 MED ORDER — CEFAZOLIN SODIUM-DEXTROSE 2-4 GM/100ML-% IV SOLN
2.0000 g | INTRAVENOUS | Status: AC
  Administered 2023-05-29: 2 g via INTRAVENOUS

## 2023-05-29 MED ORDER — INSULIN ASPART 100 UNIT/ML IJ SOLN
0.0000 [IU] | Freq: Three times a day (TID) | INTRAMUSCULAR | Status: DC
  Administered 2023-05-29: 3 [IU] via SUBCUTANEOUS
  Administered 2023-05-30: 11 [IU] via SUBCUTANEOUS
  Administered 2023-05-30: 15 [IU] via SUBCUTANEOUS

## 2023-05-29 SURGICAL SUPPLY — 70 items
ATTUNE MED DOME PAT 38 KNEE (Knees) IMPLANT
ATTUNE PS FEM LT SZ 5 CEM KNEE (Femur) IMPLANT
ATTUNE PSRP INSR SZ5 5 KNEE (Insert) IMPLANT
BAG COUNTER SPONGE SURGICOUNT (BAG) ×1 IMPLANT
BAG SPNG CNTER NS LX DISP (BAG) ×1
BANDAGE ESMARK 6X9 LF (GAUZE/BANDAGES/DRESSINGS) ×1 IMPLANT
BASE TIBIAL ROT PLAT SZ 5 KNEE (Knees) IMPLANT
BLADE SAGITTAL 25.0X1.19X90 (BLADE) ×1 IMPLANT
BLADE SAW SGTL 13X75X1.27 (BLADE) ×1 IMPLANT
BNDG CMPR 9X6 STRL LF SNTH (GAUZE/BANDAGES/DRESSINGS)
BNDG CMPR MED 10X6 ELC LF (GAUZE/BANDAGES/DRESSINGS) ×1
BNDG CMPR STD VLCR NS LF 5.8X4 (GAUZE/BANDAGES/DRESSINGS)
BNDG ELASTIC 4X5.8 VLCR NS LF (GAUZE/BANDAGES/DRESSINGS) ×1 IMPLANT
BNDG ELASTIC 4X5.8 VLCR STR LF (GAUZE/BANDAGES/DRESSINGS) ×1 IMPLANT
BNDG ELASTIC 6X10 VLCR STRL LF (GAUZE/BANDAGES/DRESSINGS) ×1 IMPLANT
BNDG ESMARK 6X9 LF (GAUZE/BANDAGES/DRESSINGS)
BOWL SMART MIX CTS (DISPOSABLE) ×1 IMPLANT
BSPLAT TIB 5 CMNT ROT PLAT STR (Knees) ×1 IMPLANT
CEMENT HV SMART SET (Cement) ×2 IMPLANT
COVER SURGICAL LIGHT HANDLE (MISCELLANEOUS) ×1 IMPLANT
CUFF TOURN SGL QUICK 34 (TOURNIQUET CUFF) ×1
CUFF TOURN SGL QUICK 42 (TOURNIQUET CUFF) IMPLANT
CUFF TRNQT CYL 34X4.125X (TOURNIQUET CUFF) ×1 IMPLANT
DRAPE SURG ORHT 6 SPLT 77X108 (DRAPES) ×2 IMPLANT
DRAPE U-SHAPE 47X51 STRL (DRAPES) ×1 IMPLANT
DRSG MEPILEX POST OP 4X12 (GAUZE/BANDAGES/DRESSINGS) IMPLANT
DURAPREP 26ML APPLICATOR (WOUND CARE) ×2 IMPLANT
ELECT REM PT RETURN 9FT ADLT (ELECTROSURGICAL) ×1
ELECTRODE REM PT RTRN 9FT ADLT (ELECTROSURGICAL) ×1 IMPLANT
FACESHIELD WRAPAROUND (MASK) ×2
FACESHIELD WRAPAROUND OR TEAM (MASK) ×2 IMPLANT
GAUZE PAD ABD 8X10 STRL (GAUZE/BANDAGES/DRESSINGS) ×1 IMPLANT
GAUZE XEROFORM 5X9 LF (GAUZE/BANDAGES/DRESSINGS) ×1 IMPLANT
GLOVE BIOGEL PI IND STRL 8 (GLOVE) ×2 IMPLANT
GLOVE ORTHO TXT STRL SZ7.5 (GLOVE) ×2 IMPLANT
GOWN STRL REUS W/ TWL LRG LVL3 (GOWN DISPOSABLE) ×1 IMPLANT
GOWN STRL REUS W/ TWL XL LVL3 (GOWN DISPOSABLE) ×1 IMPLANT
GOWN STRL REUS W/TWL 2XL LVL3 (GOWN DISPOSABLE) ×1 IMPLANT
GOWN STRL REUS W/TWL LRG LVL3 (GOWN DISPOSABLE) ×1
GOWN STRL REUS W/TWL XL LVL3 (GOWN DISPOSABLE) ×1
HANDPIECE INTERPULSE COAX TIP (DISPOSABLE) ×1
IMMOBILIZER KNEE 22 UNIV (SOFTGOODS) ×1 IMPLANT
KIT BASIN OR (CUSTOM PROCEDURE TRAY) ×1 IMPLANT
KIT TURNOVER KIT B (KITS) ×1 IMPLANT
MANIFOLD NEPTUNE II (INSTRUMENTS) ×1 IMPLANT
MARKER SKIN DUAL TIP RULER LAB (MISCELLANEOUS) ×1 IMPLANT
NDL 18GX1X1/2 (RX/OR ONLY) (NEEDLE) ×1 IMPLANT
NDL HYPO 25GX1X1/2 BEV (NEEDLE) ×1 IMPLANT
NEEDLE 18GX1X1/2 (RX/OR ONLY) (NEEDLE) ×2
NEEDLE HYPO 25GX1X1/2 BEV (NEEDLE) ×1
NS IRRIG 1000ML POUR BTL (IV SOLUTION) ×1 IMPLANT
PACK TOTAL JOINT (CUSTOM PROCEDURE TRAY) ×1 IMPLANT
PAD ARMBOARD 7.5X6 YLW CONV (MISCELLANEOUS) ×2 IMPLANT
PADDING CAST COTTON 6X4 STRL (CAST SUPPLIES) ×1 IMPLANT
PIN STEINMAN FIXATION KNEE (PIN) IMPLANT
SET HNDPC FAN SPRY TIP SCT (DISPOSABLE) ×1 IMPLANT
STAPLER VISISTAT 35W (STAPLE) IMPLANT
SUCTION TUBE FRAZIER 10FR DISP (SUCTIONS) ×1 IMPLANT
SUT VIC AB 0 CT1 27 (SUTURE) ×1
SUT VIC AB 0 CT1 27XBRD ANBCTR (SUTURE) ×1 IMPLANT
SUT VIC AB 1 CTX 36 (SUTURE) ×2
SUT VIC AB 1 CTX36XBRD ANBCTR (SUTURE) ×2 IMPLANT
SUT VIC AB 2-0 CT1 27 (SUTURE) ×2
SUT VIC AB 2-0 CT1 TAPERPNT 27 (SUTURE) ×2 IMPLANT
SYR 50ML LL SCALE MARK (SYRINGE) ×1 IMPLANT
SYR CONTROL 10ML LL (SYRINGE) ×1 IMPLANT
TIBIAL BASE ROT PLAT SZ 5 KNEE (Knees) ×1 IMPLANT
TOWEL GREEN STERILE (TOWEL DISPOSABLE) ×1 IMPLANT
TOWEL GREEN STERILE FF (TOWEL DISPOSABLE) ×1 IMPLANT
TRAY CATH INTERMITTENT SS 16FR (CATHETERS) IMPLANT

## 2023-05-29 NOTE — Transfer of Care (Signed)
Immediate Anesthesia Transfer of Care Note  Patient: Brian Bruce  Procedure(s) Performed: LEFT TOTAL KNEE ARTHROPLASTY (Left: Knee)  Patient Location: PACU  Anesthesia Type:Spinal and MAC combined with regional for post-op pain  Level of Consciousness: awake, alert , oriented, and patient cooperative  Airway & Oxygen Therapy: Patient connected to nasal cannula oxygen and Patient connected to face mask oxygen  Post-op Assessment: Report given to RN and Post -op Vital signs reviewed and stable  Post vital signs: Reviewed and stable  Last Vitals:  Vitals Value Taken Time  BP 119/65 05/29/23 1448  Temp 36.4 C 05/29/23 1448  Pulse 63 05/29/23 1452  Resp 15 05/29/23 1452  SpO2 97 % 05/29/23 1452  Vitals shown include unfiled device data.  Last Pain:  Vitals:   05/29/23 1225  TempSrc:   PainSc: 0-No pain         Complications: No notable events documented.

## 2023-05-29 NOTE — Plan of Care (Signed)
 [facility] Tonsillectomy/Adenoidectomy, Postoperative PEDS care plan entered in error.

## 2023-05-29 NOTE — Anesthesia Procedure Notes (Signed)
Spinal  Patient location during procedure: OR Start time: 05/29/2023 12:45 PM End time: 05/29/2023 12:55 PM Reason for block: surgical anesthesia Staffing Anesthesiologist: Marcene Duos, MD Performed by: Marcene Duos, MD Authorized by: Marcene Duos, MD   Preanesthetic Checklist Completed: patient identified, IV checked, site marked, risks and benefits discussed, surgical consent, monitors and equipment checked, pre-op evaluation and timeout performed Spinal Block Patient position: sitting Prep: DuraPrep Patient monitoring: heart rate, cardiac monitor, continuous pulse ox and blood pressure Approach: midline Location: L3-4 Injection technique: single-shot Needle Needle type: Pencan  Needle gauge: 24 G Needle length: 9 cm Assessment Sensory level: T4 Events: CSF return

## 2023-05-29 NOTE — Plan of Care (Signed)
  Problem: Education: Goal: Knowledge of General Education information will improve Description: Including pain rating scale, medication(s)/side effects and non-pharmacologic comfort measures Outcome: Progressing   Problem: Health Behavior/Discharge Planning: Goal: Ability to manage health-related needs will improve Outcome: Progressing   Problem: Clinical Measurements: Goal: Ability to maintain clinical measurements within normal limits will improve Outcome: Progressing Goal: Will remain free from infection Outcome: Progressing Goal: Diagnostic test results will improve Outcome: Progressing Goal: Respiratory complications will improve Outcome: Progressing Goal: Cardiovascular complication will be avoided Outcome: Progressing   Problem: Activity: Goal: Risk for activity intolerance will decrease Outcome: Progressing   Problem: Nutrition: Goal: Adequate nutrition will be maintained Outcome: Progressing   Problem: Coping: Goal: Level of anxiety will decrease Outcome: Progressing   Problem: Elimination: Goal: Will not experience complications related to bowel motility Outcome: Progressing Goal: Will not experience complications related to urinary retention Outcome: Progressing   Problem: Pain Management: Goal: General experience of comfort will improve Outcome: Progressing   Problem: Safety: Goal: Ability to remain free from injury will improve Outcome: Progressing   Problem: Skin Integrity: Goal: Risk for impaired skin integrity will decrease Outcome: Progressing   Problem: Education: Goal: Ability to describe self-care measures that may prevent or decrease complications (Diabetes Survival Skills Education) will improve Outcome: Progressing Goal: Individualized Educational Video(s) Outcome: Progressing   Problem: Coping: Goal: Ability to adjust to condition or change in health will improve Outcome: Progressing   Problem: Fluid Volume: Goal: Ability to  maintain a balanced intake and output will improve Outcome: Progressing   Problem: Health Behavior/Discharge Planning: Goal: Ability to identify and utilize available resources and services will improve Outcome: Progressing Goal: Ability to manage health-related needs will improve Outcome: Progressing   Problem: Metabolic: Goal: Ability to maintain appropriate glucose levels will improve Outcome: Progressing   Problem: Nutritional: Goal: Maintenance of adequate nutrition will improve Outcome: Progressing Goal: Progress toward achieving an optimal weight will improve Outcome: Progressing   Problem: Skin Integrity: Goal: Risk for impaired skin integrity will decrease Outcome: Progressing   Problem: Tissue Perfusion: Goal: Adequacy of tissue perfusion will improve Outcome: Progressing   Problem: Education: Goal: Knowledge of the prescribed therapeutic regimen will improve Outcome: Progressing Goal: Individualized Educational Video(s) Outcome: Progressing   Problem: Activity: Goal: Ability to avoid complications of mobility impairment will improve Outcome: Progressing Goal: Range of joint motion will improve Outcome: Progressing   Problem: Clinical Measurements: Goal: Postoperative complications will be avoided or minimized Outcome: Progressing   Problem: Pain Management: Goal: Pain level will decrease with appropriate interventions Outcome: Progressing   Problem: Skin Integrity: Goal: Will show signs of wound healing Outcome: Progressing

## 2023-05-29 NOTE — Interval H&P Note (Signed)
History and Physical Interval Note:  05/29/2023 12:37 PM  Brian Bruce  has presented today for surgery, with the diagnosis of left knee osteoarthritis.  The various methods of treatment have been discussed with the patient and family. After consideration of risks, benefits and other options for treatment, the patient has consented to  Procedure(s) with comments: LEFT TOTAL KNEE ARTHROPLASTY (Left) - Needs RNFA as a surgical intervention.  The patient's history has been reviewed, patient examined, no change in status, stable for surgery.  I have reviewed the patient's chart and labs.  Questions were answered to the patient's satisfaction.     Eldred Manges

## 2023-05-29 NOTE — Plan of Care (Signed)
  Problem: Education: Goal: Knowledge of General Education information will improve Description: Including pain rating scale, medication(s)/side effects and non-pharmacologic comfort measures Outcome: Progressing   Problem: Activity: Goal: Risk for activity intolerance will decrease Outcome: Progressing   Problem: Pain Management: Goal: General experience of comfort will improve Outcome: Progressing   Problem: Safety: Goal: Ability to remain free from injury will improve Outcome: Progressing   Problem: Skin Integrity: Goal: Risk for impaired skin integrity will decrease Outcome: Progressing

## 2023-05-29 NOTE — Op Note (Signed)
Pre and postop diagnosis: Left knee primary osteoarthritis  Procedure: Left total knee arthroplasty, cemented.  Surgeon: Annell Greening, MD  Assistant: Georgann Housekeeper, RNFA  Anesthesia: Preoperative adductor block plus spinal anesthesia plus Exparel and Marcaine 20 +20 mL.  Implants:mplants  CEMENT HV SMART SET - [id number]  Inventory Item: CEMENT HV SMART SET Serial no.: Model/Cat no.: [id number]  Implant name: CEMENT HV SMART SET - [id number] Laterality: Left Area: Knee  Manufacturer: [employer] Date of Manufacture:   Action: Implanted Number Used: 1   Device Identifier: Device Identifier Type:   CEMENT HV SMART SET - [id number]  Inventory Item: CEMENT HV SMART SET Serial no.: Model/Cat no.: [id number]  Implant name: CEMENT HV SMART SET - [id number] Laterality: Left Area: Knee  Manufacturer: [employer] Date of Manufacture:   Action: Implanted Number Used: 1   Device Identifier: Device Identifier Type:   ATTUNE PSRP INSR SZ5 5 KNEE - [id number]  Inventory Item: ATTUNE PSRP INSR SZ5 5 KNEE Serial no.: Model/Cat no.: [id number]  Implant name: ATTUNE PSRP INSR SZ5 5 KNEE - [id number] Laterality: Left Area: Knee  Manufacturer: [employer] Date of Manufacture:   Action: Implanted Number Used: 1   Device Identifier: Device Identifier Type:   ATTUNE MED DOME PAT 38 KNEE - [id number]  Inventory Item: ATTUNE MED DOME PAT 38 KNEE Serial no.: Model/Cat no.: [id number]  Implant name: ATTUNE MED DOME PAT 38 KNEE - [id number] Laterality: Left Area: Knee  Manufacturer: [employer] Date of Manufacture:   Action: Implanted Number Used: 1   Device Identifier: Device Identifier Type:   ATTUNE PS FEM LT SZ 5 CEM KNEE - [id number]  Inventory Item: ATTUNE PS FEM LT SZ 5 CEM KNEE Serial no.: Model/Cat no.: [id number]  Implant name: ATTUNE PS FEM LT SZ 5 CEM KNEE - [id number] Laterality: Left Area: Knee  Manufacturer: [employer] Date of Manufacture:   Action: Implanted  Number Used: 1   Device Identifier: Device Identifier Type:   BSPLAT TIB 5 CMNT ROT PLAT STR - [id number]  Inventory Item: BSPLAT TIB 5 CMNT ROT PLAT STR Serial no.: Model/Cat no.: [id number]  Implant name: BSPLAT TIB 5 CMNT ROT PLAT STR - [id number] Laterality: Left Area: Knee  Manufacturer: [employer] Date of Manufacture:   Action: Implanted Number Used: 1   Device Identifier: Device Identifier Type:     Tourniquet 47 minutes x 300 pressure.  Procedure after standard prepping and draping with spinal anesthesia adductor block lateral post heel bump DuraPrep from the tip of the toes to the tourniquet at the usual total knee sheets draped impervious stockinette Coban sterile skin marker Betadine Steri-Drape was used to seal the skin.  Timeout procedure completed IV TXA Ancef 2 g prophylaxis.  Legs wrapped in Esmarch tourniquet inflated.  Midline incision was made medial parapatellar incision was made patella was everted 10 mm resected off the patella.  Intramedullary hole drilled distal femur was cut chamfer cuts were made size for a #5 and tibias #5 as well.  He will preparation patellar drilling ligament drilling was performed.  Pulse lavage.  There was tricompartmental degenerative arthritis bone-on-bone changes primarily medial compartment meniscal remnants were resected ACL PCL were all resected.  Post lavage preparation of the bone.  Tibia cemented first followed by femur placement of the 5 mm rotating platform poly with full extension and patella held with the patellar clamp.  Exparel Marcaine 20+20 was injected while the cement was setting up.  Hemostasis was obtained after dropping tourniquet once the cement  was hard.  Standard layered closure with #1 Vicryl interrupted in the quad tendon split between the medial third lateral two thirds and medial parapatellar incision.  Vicryl subtenons tissue skin staple closure postop dressing Mepilex 4 x 4's Webril Ace wrap ice machine knee  immobilizer and transferred care room.

## 2023-05-29 NOTE — Anesthesia Procedure Notes (Signed)
Anesthesia Regional Block: Adductor canal block   Pre-Anesthetic Checklist: , timeout performed,  Correct Patient, Correct Site, Correct Laterality,  Correct Procedure, Correct Position, site marked,  Risks and benefits discussed,  Surgical consent,  Pre-op evaluation,  At surgeon's request and post-op pain management  Laterality: Left  Prep: chloraprep       Needles:  Injection technique: Single-shot  Needle Type: Echogenic Needle     Needle Length: 9cm  Needle Gauge: 21     Additional Needles:   Procedures:,,,, ultrasound used (permanent image in chart),,    Narrative:  Start time: 05/29/2023 12:14 PM End time: 05/29/2023 12:19 PM Injection made incrementally with aspirations every 5 mL.  Performed by: Personally  Anesthesiologist: Marcene Duos, MD

## 2023-05-29 NOTE — Progress Notes (Signed)
Orthopedic Tech Progress Note Patient Details:  Brian Bruce [birth year] [id number]  CPM Left Knee CPM Left Knee: On Left Knee Flexion (Degrees): 0 Left Knee Extension (Degrees): 60  Post Interventions Patient Tolerated: Well  Brian Bruce 05/29/2023, 4:34 PM

## 2023-05-30 ENCOUNTER — Encounter (HOSPITAL_COMMUNITY): Payer: Self-pay | Admitting: Orthopaedic Surgery

## 2023-05-30 DIAGNOSIS — I2510 Atherosclerotic heart disease of native coronary artery without angina pectoris: Secondary | ICD-10-CM | POA: Diagnosis not present

## 2023-05-30 DIAGNOSIS — I110 Hypertensive heart disease with heart failure: Secondary | ICD-10-CM | POA: Diagnosis not present

## 2023-05-30 DIAGNOSIS — J45909 Unspecified asthma, uncomplicated: Secondary | ICD-10-CM | POA: Diagnosis not present

## 2023-05-30 DIAGNOSIS — M1712 Unilateral primary osteoarthritis, left knee: Secondary | ICD-10-CM | POA: Diagnosis not present

## 2023-05-30 DIAGNOSIS — Z23 Encounter for immunization: Secondary | ICD-10-CM | POA: Diagnosis not present

## 2023-05-30 DIAGNOSIS — I5022 Chronic systolic (congestive) heart failure: Secondary | ICD-10-CM | POA: Diagnosis not present

## 2023-05-30 DIAGNOSIS — I4819 Other persistent atrial fibrillation: Secondary | ICD-10-CM | POA: Diagnosis not present

## 2023-05-30 LAB — BASIC METABOLIC PANEL
Anion gap: 7 (ref 5–15)
BUN: 20 mg/dL (ref 8–23)
CO2: 23 mmol/L (ref 22–32)
Calcium: 8.5 mg/dL — ABNORMAL LOW (ref 8.9–10.3)
Chloride: 103 mmol/L (ref 98–111)
Creatinine, Ser: 0.98 mg/dL (ref 0.61–1.24)
GFR, Estimated: >60 mL/min (ref >60)
Glucose, Bld: 315 mg/dL — ABNORMAL HIGH (ref 70–99)
Potassium: 4.1 mmol/L (ref 3.5–5.1)
Sodium: 133 mmol/L — ABNORMAL LOW (ref 135–145)

## 2023-05-30 LAB — CBC
HCT: 29.4 % — ABNORMAL LOW (ref 39.0–52.0)
Hemoglobin: 9.8 g/dL — ABNORMAL LOW (ref 13.0–17.0)
MCH: 30.5 pg (ref 26.0–34.0)
MCHC: 33.3 g/dL (ref 30.0–36.0)
MCV: 91.6 fL (ref 80.0–100.0)
Platelets: 243 10*3/uL (ref 150–400)
RBC: 3.21 MIL/uL — ABNORMAL LOW (ref 4.22–5.81)
RDW: 12.7 % (ref 11.5–15.5)
WBC: 14.7 10*3/uL — ABNORMAL HIGH (ref 4.0–10.5)
nRBC: 0 % (ref 0.0–0.2)

## 2023-05-30 LAB — PROTIME-INR
INR: 1.2 (ref 0.8–1.2)
Prothrombin Time: 14.9 s (ref 11.4–15.2)

## 2023-05-30 LAB — MAGNESIUM: Magnesium: 2.1 mg/dL (ref 1.7–2.4)

## 2023-05-30 LAB — GLUCOSE, CAPILLARY
Glucose-Capillary: 358 mg/dL — ABNORMAL HIGH (ref 70–99)
Glucose-Capillary: 302 mg/dL — ABNORMAL HIGH (ref 70–99)

## 2023-05-30 MED ORDER — OXYCODONE-ACETAMINOPHEN 5-325 MG PO TABS
1.0000 | ORAL_TABLET | Freq: Four times a day (QID) | ORAL | 0 refills | Status: DC | PRN
Start: 2023-05-30 — End: 2023-06-06

## 2023-05-30 MED ORDER — OXYCODONE HCL 5 MG PO TABS
5.0000 mg | ORAL_TABLET | ORAL | Status: DC | PRN
  Filled 2023-05-30: qty 1

## 2023-05-30 NOTE — Progress Notes (Addendum)
Patient ID: Brian Bruce, male   DOB: [birth year], 77 y.o.   MRN: [id number]   Subjective: 1 Day Post-Op Procedure(s) (LRB): LEFT TOTAL KNEE ARTHROPLASTY (Left) Patient reports pain as mild and moderate.    Objective: Vital signs in last 24 hours: Temp:  [97.6 F (36.4 C)-98.4 F (36.9 C)] 98 F (36.7 C) (10/29 0759) Pulse Rate:  [60-64] 64 (10/29 0759) Resp:  [11-40] 18 (10/29 0759) BP: (119-167)/(51-73) 150/69 (10/29 0759) SpO2:  [95 %-100 %] 97 % (10/29 0759) Weight:  [82.6 kg] 82.6 kg (10/28 1014)  Intake/Output from previous day: 10/28 0701 - 10/29 0700 In: 100 [IV Piggyback:100] Out: 1320 [Urine:1300; Blood:20] Intake/Output this shift: No intake/output data recorded.  Recent Labs    05/30/23 0635  HGB 9.8*   Recent Labs    05/30/23 0635  WBC 14.7*  RBC 3.21*  HCT 29.4*  PLT 243   Recent Labs    05/30/23 0635  NA 133*  K 4.1  CL 103  CO2 23  BUN 20  CREATININE 0.98  GLUCOSE 315*  CALCIUM 8.5*   Recent Labs    05/29/23 1115 05/30/23 0635  INR 1.1 1.2    Neurologically intact, can do leg lift.  No results found.  Assessment/Plan: 1 Day Post-Op Procedure(s) (LRB): LEFT TOTAL KNEE ARTHROPLASTY (Left) Up with therapy. Home with ice machine.  If does well with PT can go home this afternoon.  Needs Walker and HHPT.  Brian Bruce 05/30/2023, 8:03 AM

## 2023-05-30 NOTE — Anesthesia Postprocedure Evaluation (Signed)
Anesthesia Post Note  Patient: Brian Bruce  Procedure(s) Performed: LEFT TOTAL KNEE ARTHROPLASTY (Left: Knee)     Patient location during evaluation: PACU Anesthesia Type: Spinal Level of consciousness: awake and alert Pain management: pain level controlled Vital Signs Assessment: post-procedure vital signs reviewed and stable Respiratory status: spontaneous breathing and respiratory function stable Cardiovascular status: blood pressure returned to baseline and stable Postop Assessment: spinal receding Anesthetic complications: no   No notable events documented.  Last Vitals:  Vitals:   05/30/23 0454 05/30/23 0759  BP: (!) 144/68 (!) 150/69  Pulse: 63 64  Resp: 18 18  Temp: 36.6 C 36.7 C  SpO2: 96% 97%    Last Pain:  Vitals:   05/30/23 0759  TempSrc: Oral  PainSc:                  Kennieth Rad

## 2023-05-30 NOTE — Care Plan (Signed)
Ortho Bundle Case Management Note  Patient Details  Name: Brian Bruce MRN: [id number] Date of Birth: 04/27/1946   Met with patient in office during his H&P appointment with Dr. Ophelia Charter' PA to discuss his upcoming Left total knee arthroplasty on 05/29/23 at [facility]. He is agreeable to case management. He had several sons and a brother that attended the pre-op appointment and reviewed all information. Plan is to return home after surgery with assistance from family. Has a standard RW. Anticipate HHPT will be needed after a short hospital stay. Referral to [facility] after choice provided. Reviewed all post op care instructions. Will continue to follow for needs.   DME Arranged:   (Patient reported before surgery that he has a standard walker with front wheels.) DME Agency:     HH Arranged:  PT HH Agency:  [facility]  Additional Comments: Please contact me with any questions of if this plan should need to change.  Ralph Dowdy, RN, BSN, [employer]  [phone] 05/30/2023, 9:05 AM

## 2023-05-30 NOTE — Discharge Instructions (Signed)

## 2023-05-30 NOTE — Discharge Summary (Signed)
Physician Discharge Summary  Patient ID: KUPER SIVERS MRN: [id number] DOB/AGE: [birth year] 77 y.o.  Admit date: 05/29/2023 Discharge date: 05/30/2023  Admission Diagnoses: Left knee primary osteoarthritis  Discharge Diagnoses:  Principal Problem:   S/P total knee arthroplasty, left Active Problems:   Unilateral primary osteoarthritis, left knee   Discharged Condition: good  Hospital Course: Patient was admitted for left knee primary osteoarthritis failed conservative treatment with bone-on-bone changes.  He had previous anti-inflammatories as well as injections.  Patient underwent total knee arthroplasty with adductor block, spinal anesthesia Exparel and Marcaine prior to closure.  Ice machine, PT OT.  IntraOp TXA and Ancef prophylaxis.  Postop day 1 he could lift his leg on his own ambulated up and down the hall with therapy and was discharged home with follow-up outpatient PT starting next week.  His sons are available to be with him. Patient's Coumadin was stopped 5 days before surgery and restarted the day after surgery without loading.  He alternates 7.5 and 5 mg dosages. Consults:   Significant Diagnostic Studies: Routine diagnostic labs.  Postop hemoglobin was 9.8 stable.  Treatments: Surgery as above.  Discharge Exam: Blood pressure (!) 150/69, pulse 64, temperature 98 F (36.7 C), temperature source Oral, resp. rate 18, height 5\' 8"  (1.727 m), weight 82.6 kg, SpO2 97%. Physical exam: New dressing was applied prior to discharge.  Disposition: Discharge disposition: 01-Home or Self Care      Discharge with his ice machine.  Coumadin was restarted without loading.  Keep his knee elevated, he has a walker at home and outpatient PT at [facility] starts next week.  Allergies as of 05/30/2023   No Known Allergies      Medication List     STOP taking these medications    acetaminophen 500 MG tablet Commonly known as: TYLENOL   aspirin EC 81 MG tablet        TAKE these medications    amLODipine 10 MG tablet Commonly known as: NORVASC Take 1 tablet by mouth daily.   atorvastatin 80 MG tablet Commonly known as: LIPITOR Take 80 mg by mouth every evening.   augmented betamethasone dipropionate 0.05 % cream Commonly known as: DIPROLENE-AF Apply 1 application topically 2 (two) times daily as needed (rash).   carvedilol 25 MG tablet Commonly known as: COREG Take 1 tablet by mouth 2 (two) times daily.   Co Q-10 100 MG Caps Take 100 mg by mouth daily.   diclofenac Sodium 1 % Gel Commonly known as: VOLTAREN Apply 2 g topically 4 (four) times daily as needed (pain).   digoxin 0.125 MG tablet Commonly known as: LANOXIN Take 1 tablet (125 mcg total) by mouth daily.   dofetilide 500 MCG capsule Commonly known as: TIKOSYN Take 1 capsule (500 mcg total) by mouth 2 (two) times daily.   Fish Oil 1000 MG Caps Take 1,000 mg by mouth 3 (three) times daily.   glimepiride 2 MG tablet Commonly known as: AMARYL Take 1 tablet by mouth daily.   GLUCOSAMINE-CHONDROITIN PO Take 1 tablet by mouth 2 (two) times daily.   hydrALAZINE 50 MG tablet Commonly known as: APRESOLINE Take 50 mg by mouth 3 (three) times daily.   MAGnesium-Oxide 400 (240 Mg) MG tablet Generic drug: magnesium oxide Take 1 tablet by mouth twice daily   metFORMIN 1000 MG tablet Commonly known as: GLUCOPHAGE Take 1 tablet (1,000 mg total) by mouth 2 (two) times daily.   olmesartan 40 MG tablet Commonly known as: BENICAR Take 40 mg  by mouth daily.   oxyCODONE-acetaminophen 5-325 MG tablet Commonly known as: Percocet Take 1-2 tablets by mouth every 6 (six) hours as needed.   Pataday 0.1 % ophthalmic solution Generic drug: olopatadine Place 1 drop into both eyes daily.   torsemide 20 MG tablet Commonly known as: DEMADEX Take 1 tablet by mouth once daily   Vitamin D 50 MCG (2000 UT) tablet Take 1,000 Units by mouth daily.   warfarin 5 MG tablet Commonly known  as: COUMADIN Take 5-7.5 mg by mouth See admin instructions. Take 5 mg on Tues, Sat, and Sun Take 7.5 mg on Mon, Wed, Thurs, and Fri        Follow-up Information     Eldred Manges, MD. Go in 1 week(s).   Specialty: Orthopedic Surgery Why: keep appt you have next week in [facility] clinic on 06/07/23 at 9:30 am Contact information: [address] [place] [address] [phone]                 Signed: Eldred Manges 05/30/2023, 3:11 PM

## 2023-05-30 NOTE — Progress Notes (Signed)
Patient ID: Brian Bruce, male   DOB: January 11, 1946, 77 y.o.   MRN: [id number] Talked with patient after his therapy visit.  He did great with therapy.  He can go straight to outpatient PT next to the [place] office since his sons would be able to get him there.  Will not have to have home health PT then.

## 2023-05-30 NOTE — Care Plan (Signed)
RNCM was notified that patient did well with therapy today and felt he could go straight to OPPT. This is patient's wish. Originally set up for HHPT with [facility]. Liaison with [facility] notified to cancel referral. Call to [facility] in [place] and they couldn't see patient until next week due to scheduling. [facility] in [place] could see patient at end of week and this was agreeable to patient and his family. OPPT scheduled for Friday, 06/02/23 at 10:30 am. Orders sent.

## 2023-05-30 NOTE — Evaluation (Signed)
Occupational Therapy Evaluation Patient Details Name: Brian Bruce MRN: [id number] DOB: July 23, 1946 Today's Date: 05/30/2023   History of Present Illness 77 y.o. male, s/p L total knee arthroplasty due to arthritis and has failed non-surgical conservative treatments for greater than 12 weeks. PMH BL OA of knees, A-fib, CAD, automatic cardioverter/defibrillator present, arthritis, CHF, HTN, and DM.   Clinical Impression   Pt c/o 6/10 pain at rest, minimal increase in pain with activity. Pt family present during session. Pt in good spirits, eager to return home, lives alone but has ample support from family as needed. PLOF fully independent, no history of falls or RW/cane use. Pt currently requires RW for support, set up/supervision for ADLs/mobility, displays good safety awareness and overall balance. Pt able to stand unsupported at sink and complete standing ADLs as needed. Pt states he has all DME at home needed to remain safe/independent, will have help from family daily. Pt has no further acute care needs, follow physicians recommendations for postacute therapy needs.        If plan is discharge home, recommend the following: A little help with walking and/or transfers;A little help with bathing/dressing/bathroom;Assistance with cooking/housework;Assist for transportation;Help with stairs or ramp for entrance    Functional Status Assessment  Patient has had a recent decline in their functional status and demonstrates the ability to make significant improvements in function in a reasonable and predictable amount of time.  Equipment Recommendations  None recommended by OT    Recommendations for Other Services       Precautions / Restrictions Precautions Precautions: Fall;ICD/Pacemaker Restrictions Weight Bearing Restrictions: Yes LLE Weight Bearing: Weight bearing as tolerated      Mobility Bed Mobility               General bed mobility comments: arrived in recliner     Transfers Overall transfer level: Needs assistance Equipment used: Rolling walker (2 wheels) Transfers: Sit to/from Stand, Bed to chair/wheelchair/BSC Sit to Stand: Supervision           General transfer comment: supervision for transfers/mobility      Balance Overall balance assessment: Mild deficits observed, not formally tested                                         ADL either performed or assessed with clinical judgement   ADL Overall ADL's : Needs assistance/impaired                                       General ADL Comments: Pt set up/supervision for ADLs/mobility, able to stand at sink unsupported to complete grooming.     Vision Baseline Vision/History: 1 Wears glasses Ability to See in Adequate Light: 0 Adequate Patient Visual Report: No change from baseline       Perception         Praxis         Pertinent Vitals/Pain Pain Assessment Pain Assessment: 0-10 Pain Score: 6  Pain Location: L knee Pain Descriptors / Indicators: Aching Pain Intervention(s): Monitored during session     Extremity/Trunk Assessment Upper Extremity Assessment Upper Extremity Assessment: Overall WFL for tasks assessed           Communication Communication Communication: No apparent difficulties   Cognition Arousal: Alert Behavior During Therapy: WFL for tasks assessed/performed Overall  Cognitive Status: Within Functional Limits for tasks assessed                                       General Comments       Exercises     Shoulder Instructions      Home Living Family/patient expects to be discharged to:: Private residence Living Arrangements: Alone Available Help at Discharge: Family Type of Home: House Home Access: Stairs to enter [employer] of Steps: 2   Home Layout: Two level;Able to live on main level with bedroom/bathroom Alternate Level Stairs-Number of Steps: flight   Bathroom  Shower/Tub: Walk-in shower         Home Equipment: Agricultural consultant (2 wheels);Cane - single point;BSC/3in1;Shower seat;Grab bars - tub/shower;Wheelchair - manual   Additional Comments: Pt lives alone, has a lot of family support as needed      Prior Functioning/Environment Prior Level of Function : Independent/Modified Independent                        OT Problem List: Decreased range of motion;Decreased activity tolerance;Impaired balance (sitting and/or standing);Pain      OT Treatment/Interventions:      OT Goals(Current goals can be found in the care plan section) Acute Rehab OT Goals Patient Stated Goal: to return home OT Goal Formulation: With patient/family Time For Goal Achievement: 06/14/23 Potential to Achieve Goals: Good  OT Frequency:      Co-evaluation              AM-PAC OT "6 Clicks" Daily Activity     Outcome Measure Help from another person eating meals?: None Help from another person taking care of personal grooming?: A Little Help from another person toileting, which includes using toliet, bedpan, or urinal?: A Little Help from another person bathing (including washing, rinsing, drying)?: A Little Help from another person to put on and taking off regular upper body clothing?: A Little Help from another person to put on and taking off regular lower body clothing?: A Little 6 Click Score: 19   End of Session Equipment Utilized During Treatment: Rolling walker (2 wheels) Nurse Communication: Mobility status  Activity Tolerance: Patient tolerated treatment well Patient left: in chair;with call bell/phone within reach;with family/visitor present  OT Visit Diagnosis: Other abnormalities of gait and mobility (R26.89);Muscle weakness (generalized) (M62.81);Pain Pain - Right/Left: Left Pain - part of body: Knee                Time: 1023-1050 OT Time Calculation (min): 27 min Charges:  OT General Charges $OT Visit: 1 Visit OT Evaluation $OT  Eval Low Complexity: 1 Low OT Treatments $Self Care/Home Management : 8-22 mins  Berry, OTR/L   Alexis Goodell 05/30/2023, 10:55 AM

## 2023-05-30 NOTE — Evaluation (Signed)
Physical Therapy Evaluation Patient Details Name: Brian Bruce MRN: [id number] DOB: 1945-12-16 Today's Date: 05/30/2023  History of Present Illness  77 y.o. male, s/p L total knee arthroplasty due to arthritis and has failed non-surgical conservative treatments for greater than 12 weeks. PMH BL OA of knees, A-fib, CAD, automatic cardioverter/defibrillator present, arthritis, CHF, HTN, and DM.  Clinical Impression   Pt admitted with above diagnosis. Lives at home alone, in a single-level home with 3 steps to enter; Prior to admission, pt was independent with mobility and ADLs; Presents to PT with a functional decline compared to baseline, L knee pain; Overall supervision for bed mobility and simple transfers; Walked in the hallways well with RW, nice, stable knee in stance; Stair training complete, and pt's brother Jorja Loa gave good support/assist; discussed car transfers;  Overall, OK for dc home from PT standpoint, will follow while in house; Pt currently with functional limitations due to the deficits listed below (see PT Problem List). Pt will benefit from skilled PT to increase their independence and safety with mobility to allow discharge to the venue listed below.           If plan is discharge home, recommend the following: A little help with bathing/dressing/bathroom;Assistance with cooking/housework;Assist for transportation   Can travel by private vehicle   Yes    Equipment Recommendations Other (comment) (Has all needed equipment)  Recommendations for Other Services       Functional Status Assessment Patient has had a recent decline in their functional status and demonstrates the ability to make significant improvements in function in a reasonable and predictable amount of time.     Precautions / Restrictions Precautions Precautions: Fall;Knee Precaution Booklet Issued: Yes (comment) Precaution Comments: Fall risk is present, but minimized with use of RW Restrictions Weight  Bearing Restrictions: No LLE Weight Bearing: Weight bearing as tolerated      Mobility  Bed Mobility Overal bed mobility: Modified Independent             General bed mobility comments: Good L knee control coming off of bed    Transfers Overall transfer level: Needs assistance Equipment used: Rolling walker (2 wheels) Transfers: Sit to/from Stand, Bed to chair/wheelchair/BSC Sit to Stand: Supervision           General transfer comment: supervision for transfers/mobility    Ambulation/Gait Ambulation/Gait assistance: Supervision Gait Distance (Feet): 120 Feet Assistive device: Rolling walker (2 wheels) Gait Pattern/deviations: Step-through pattern (emerging)       General Gait Details: Cues to activate L quad for stance stability; nice, stable knee in stance; no dizziness reported  Stairs Stairs: Yes Stairs assistance: Min assist Stair Management: No rails, Backwards, With walker Number of Stairs: 3 General stair comments: Step by step cues for negotiating steps with RW backwards; Pt's brother, Tim gave correct assist  Wheelchair Mobility     Tilt Bed    Modified Rankin (Stroke Patients Only)       Balance Overall balance assessment: Mild deficits observed, not formally tested                                           Pertinent Vitals/Pain Pain Assessment Pain Assessment: 0-10 Pain Score: 6  Pain Location: L knee Pain Descriptors / Indicators: Aching Pain Intervention(s): Monitored during session    Home Living Family/patient expects to be discharged to:: Private residence Living Arrangements:  Alone Available Help at Discharge: Family Type of Home: House Home Access: Stairs to enter   Entrance Stairs-Number of Steps: 2 Alternate Level Stairs-Number of Steps: flight Home Layout: Two level;Able to live on main level with bedroom/bathroom Home Equipment: Rolling Walker (2 wheels);Cane - single point;BSC/3in1;Shower  seat;Grab bars - tub/shower;Wheelchair - manual Additional Comments: Pt lives alone, has a lot of family support as needed    Prior Function Prior Level of Function : Independent/Modified Independent                     Extremity/Trunk Assessment   Upper Extremity Assessment Upper Extremity Assessment: Overall WFL for tasks assessed    Lower Extremity Assessment Lower Extremity Assessment: LLE deficits/detail LLE Deficits / Details: Decr pain-free ROM post TKA; good quad contraction, no observed knee buckle in stance; able to self-AAROM into approx 91deg flexion       Communication   Communication Communication: No apparent difficulties  Cognition Arousal: Alert Behavior During Therapy: WFL for tasks assessed/performed Overall Cognitive Status: Within Functional Limits for tasks assessed                                          General Comments General comments (skin integrity, edema, etc.): Discussed knee precautions and car transfers; has adequate assist at home    Exercises Total Joint Exercises Ankle Circles/Pumps: AROM, Both, 5 reps Quad Sets: AROM, Left, 5 reps Heel Slides: AROM, Left, 5 reps, Supine Straight Leg Raises: AROM, Left, 10 reps, Supine Long Arc Quad: AROM, Left, 5 reps, Seated Knee Flexion: AAROM, Left, Both, Seated (including self-AAROM using R foot over L) Goniometric ROM: approx 0-91   Assessment/Plan    PT Assessment Patient needs continued PT services  PT Problem List Decreased strength;Decreased range of motion;Decreased activity tolerance;Decreased mobility;Decreased knowledge of use of DME;Decreased knowledge of precautions;Pain       PT Treatment Interventions DME instruction;Gait training;Functional mobility training;Stair training;Therapeutic activities;Therapeutic exercise;Balance training;Patient/family education    PT Goals (Current goals can be found in the Care Plan section)  Acute Rehab PT Goals Patient  Stated Goal: Get back to independence PT Goal Formulation: With patient Time For Goal Achievement: 06/06/23 Potential to Achieve Goals: Good    Frequency 7X/week     Co-evaluation               AM-PAC PT "6 Clicks" Mobility  Outcome Measure Help needed turning from your back to your side while in a flat bed without using bedrails?: None Help needed moving from lying on your back to sitting on the side of a flat bed without using bedrails?: None Help needed moving to and from a bed to a chair (including a wheelchair)?: A Little Help needed standing up from a chair using your arms (e.g., wheelchair or bedside chair)?: A Little Help needed to walk in hospital room?: A Little Help needed climbing 3-5 steps with a railing? : A Little 6 Click Score: 20    End of Session Equipment Utilized During Treatment: Gait belt Activity Tolerance: Patient tolerated treatment well Patient left: in chair;with call bell/phone within reach;with family/visitor present Nurse Communication: Mobility status PT Visit Diagnosis: Pain;Other abnormalities of gait and mobility (R26.89) Pain - Right/Left: Left Pain - part of body: Knee    Time: 7829-5621 PT Time Calculation (min) (ACUTE ONLY): 60 min   Charges:   PT Evaluation $PT  Eval Low Complexity: 1 Low PT Treatments $Gait Training: 23-37 mins $Therapeutic Exercise: 8-22 mins PT General Charges $$ ACUTE PT VISIT: 1 Visit         Van Clines, PT  Acute Rehabilitation Services Office [phone] Secure Chat welcomed   Levi Aland 05/30/2023, 11:17 AM

## 2023-06-02 DIAGNOSIS — Z96652 Presence of left artificial knee joint: Secondary | ICD-10-CM | POA: Diagnosis not present

## 2023-06-02 DIAGNOSIS — M25562 Pain in left knee: Secondary | ICD-10-CM | POA: Diagnosis not present

## 2023-06-02 DIAGNOSIS — M6281 Muscle weakness (generalized): Secondary | ICD-10-CM | POA: Diagnosis not present

## 2023-06-02 DIAGNOSIS — R262 Difficulty in walking, not elsewhere classified: Secondary | ICD-10-CM | POA: Diagnosis not present

## 2023-06-03 ENCOUNTER — Other Ambulatory Visit: Payer: Self-pay | Admitting: Cardiovascular Disease

## 2023-06-05 ENCOUNTER — Telehealth: Payer: Self-pay

## 2023-06-05 ENCOUNTER — Ambulatory Visit (INDEPENDENT_AMBULATORY_CARE_PROVIDER_SITE_OTHER): Payer: Medicare Other | Admitting: Orthopaedic Surgery

## 2023-06-05 ENCOUNTER — Other Ambulatory Visit: Payer: Self-pay | Admitting: Cardiovascular Disease

## 2023-06-05 ENCOUNTER — Encounter: Payer: Self-pay | Admitting: Orthopaedic Surgery

## 2023-06-05 VITALS — BP 179/83 | HR 63 | Ht 68.0 in | Wt 182.0 lb

## 2023-06-05 DIAGNOSIS — Z96652 Presence of left artificial knee joint: Secondary | ICD-10-CM

## 2023-06-05 NOTE — Progress Notes (Signed)
Post-Op Visit Note   Patient: Brian Bruce           Date of Birth: 1945/10/09           MRN: [id number] Visit Date: 06/05/2023 PCP: Toma Deiters, MD   Assessment & Plan: Post left total knee arthroplasty with postop hemarthrosis.  We had stopped his Coumadin on Friday swelling is down he spent the weekend with his leg propped up above his heart and stopped his home exercises.  Swelling is down we took out 40 cc blood from his knee.  He can restart therapy and take 5 mg of Coumadin tomorrow on Tuesday and then restart his normal regiment.  They have my cell phone number in case he gets repeat hemarthrosis.  Discussed with patient that this amount of swelling and having to stop his therapy for several days may make him take additional time to get good range of motion.  He did rapidly get good flexion and extension and I think he will be fine with this.  He has an appointment Wednesday he can keep if needed for repeat aspiration otherwise I will check in the following week.  Chief Complaint:  Chief Complaint  Patient presents with   Left Knee - Wound Check    05/29/2023 Left TKA   Visit Diagnoses:  1. S/P total knee arthroplasty, left     Plan: Knee aspirated.  Swelling is down with elevation.  He will get thigh-high teds at [facility] in [place] either this week or the next.  Follow-Up Instructions: No follow-ups on file.   Orders:  No orders of the defined types were placed in this encounter.  No orders of the defined types were placed in this encounter.   Imaging: No results found.  PMFS History: Patient Active Problem List   Diagnosis Date Noted   OA (osteoarthritis) of knee 05/29/2023   S/P total knee arthroplasty, left 05/29/2023   Unilateral primary osteoarthritis, left knee 05/24/2023   Bilateral primary osteoarthritis of knee 07/14/2022   Persistent atrial fibrillation (HCC) 07/01/2019   Hypercoagulable state due to  persistent atrial fibrillation (HCC) 07/01/2019   Chronic systolic dysfunction of left ventricle 05/16/2019   Chronic systolic heart failure (HCC) 03/07/2019   Coronary artery disease 03/07/2019   Past Medical History:  Diagnosis Date   AICD (automatic cardioverter/defibrillator) present    Arthritis    Asthma    CHF (congestive heart failure) (HCC)    Coronary artery disease    Diabetes mellitus without complication (HCC)    Hypertension    Ischemic cardiomyopathy    LBBB (left bundle branch block)    Persistent atrial fibrillation (HCC)     Family History  Problem Relation Age of Onset   Cancer Mother    Heart failure Father     Past Surgical History:  Procedure Laterality Date   BIV ICD INSERTION CRT-D N/A 05/16/2019   Procedure: BIV ICD INSERTION CRT-D;  Surgeon: Hillis Range, MD;  Location: [facility] INVASIVE CV LAB;  Service: Cardiovascular;  Laterality: N/A;   CORONARY ARTERY BYPASS GRAFT  1998   4-vessel   RIGHT/LEFT HEART CATH AND CORONARY/GRAFT ANGIOGRAPHY N/A 03/07/2019   Procedure: RIGHT/LEFT HEART CATH AND CORONARY/GRAFT ANGIOGRAPHY;  Surgeon: Yvonne Kendall, MD;  Location: [facility] INVASIVE CV LAB;  Service: Cardiovascular;  Laterality: N/A;   TOTAL KNEE ARTHROPLASTY Left 05/29/2023   Procedure: LEFT TOTAL KNEE ARTHROPLASTY;  Surgeon: Eldred Manges, MD;  Location: [facility] OR;  Service:  Orthopedics;  Laterality: Left;  Needs RNFA   Social History   Occupational History   Not on file  Tobacco Use   Smoking status: Former    Current packs/day: 0.00    Types: Cigarettes    Start date: 06/25/1957    Quit date: 1973    Years since quitting: 51.8   Smokeless tobacco: Never   Tobacco comments:    Former smoker 12/08/21  Vaping Use   Vaping status: Never Used  Substance and Sexual Activity   Alcohol use: Yes    Alcohol/week: 14.0 standard drinks of alcohol    Types: 14 Cans of beer per week   Drug use: Never   Sexual activity: Not on file

## 2023-06-05 NOTE — Telephone Encounter (Signed)
Attempted ICM call to patient.  Left message to send manual transmission to check fluid levels following Knee surgery last week.

## 2023-06-06 ENCOUNTER — Telehealth: Payer: Self-pay | Admitting: *Deleted

## 2023-06-06 ENCOUNTER — Other Ambulatory Visit: Payer: Self-pay | Admitting: Orthopaedic Surgery

## 2023-06-06 MED ORDER — OXYCODONE-ACETAMINOPHEN 5-325 MG PO TABS: 1.0000 | ORAL_TABLET | Freq: Four times a day (QID) | ORAL | 0 refills | Status: DC | PRN

## 2023-06-06 NOTE — Telephone Encounter (Signed)
Patient's son called and states they did pick up some thigh high compression socks at [facility] yesterday and they've already made a difference. Would like refill of pain medication sent to [facility]. Feeling much better today.

## 2023-06-06 NOTE — Telephone Encounter (Signed)
Updated patient's son.

## 2023-06-06 NOTE — Telephone Encounter (Signed)
Spoke with son today and he reports they were able to get thigh high compression socks from [facility] yesterday and this has made a difference already. Would need a refill of pain medication sent to [facility] in [place] please. He's having a better day today he reports. Thank you.

## 2023-06-07 ENCOUNTER — Other Ambulatory Visit: Payer: Self-pay

## 2023-06-07 ENCOUNTER — Ambulatory Visit (INDEPENDENT_AMBULATORY_CARE_PROVIDER_SITE_OTHER): Payer: Medicare Other | Admitting: Orthopaedic Surgery

## 2023-06-07 ENCOUNTER — Encounter: Payer: Self-pay | Admitting: Orthopaedic Surgery

## 2023-06-07 VITALS — Ht 68.0 in | Wt 182.0 lb

## 2023-06-07 DIAGNOSIS — Z96652 Presence of left artificial knee joint: Secondary | ICD-10-CM

## 2023-06-07 NOTE — Progress Notes (Signed)
Post-Op Visit Note   Patient: Brian Bruce           Date of Birth: 25-Feb-1946           MRN: [id number] Visit Date: 06/07/2023 PCP: Toma Deiters, MD   Assessment & Plan: Thigh-high teds have done well helping with the swelling.  No drainage.  He can restart his Coumadin starting today.  Recheck 1 week.  He is very worried about stiffness and range of motion however he made fantastic progress the first few days after surgery and I discussed with him that once the swelling goes down I am sure he will have good range of motion and strength.  Chief Complaint:  Chief Complaint  Patient presents with   Left Knee - Routine Post Op    05/29/2023 Left TKA   Visit Diagnoses:  1. Status post total left knee replacement     Plan: Return in 1 week.  Follow-Up Instructions: No follow-ups on file.   Orders:  Orders Placed This Encounter  Procedures   XR Knee 1-2 Views Left   No orders of the defined types were placed in this encounter.   Imaging: No results found.  PMFS History: Patient Active Problem List   Diagnosis Date Noted   OA (osteoarthritis) of knee 05/29/2023   S/P total knee arthroplasty, left 05/29/2023   Unilateral primary osteoarthritis, left knee 05/24/2023   Bilateral primary osteoarthritis of knee 07/14/2022   Persistent atrial fibrillation (HCC) 07/01/2019   Hypercoagulable state due to persistent atrial fibrillation (HCC) 07/01/2019   Chronic systolic dysfunction of left ventricle 05/16/2019   Chronic systolic heart failure (HCC) 03/07/2019   Coronary artery disease 03/07/2019   Past Medical History:  Diagnosis Date   AICD (automatic cardioverter/defibrillator) present    Arthritis    Asthma    CHF (congestive heart failure) (HCC)    Coronary artery disease    Diabetes mellitus without complication (HCC)    Hypertension    Ischemic cardiomyopathy    LBBB (left bundle branch block)    Persistent atrial fibrillation (HCC)     Family History   Problem Relation Age of Onset   Cancer Mother    Heart failure Father     Past Surgical History:  Procedure Laterality Date   BIV ICD INSERTION CRT-D N/A 05/16/2019   Procedure: BIV ICD INSERTION CRT-D;  Surgeon: Hillis Range, MD;  [facility];  Service: Cardiovascular;  Laterality: N/A;   CORONARY ARTERY BYPASS GRAFT  1998   4-vessel   RIGHT/LEFT HEART CATH AND CORONARY/GRAFT ANGIOGRAPHY N/A 03/07/2019   Procedure: RIGHT/LEFT HEART CATH AND CORONARY/GRAFT ANGIOGRAPHY;  Surgeon: Yvonne Kendall, MD;  [facility]: [facility];  Service: Cardiovascular;  Laterality: N/A;   TOTAL KNEE ARTHROPLASTY Left 05/29/2023   Procedure: LEFT TOTAL KNEE ARTHROPLASTY;  Surgeon: Eldred Manges, MD;  Location: [facility] OR;  Service: Orthopedics;  Laterality: Left;  Needs RNFA   Social History   Occupational History   Not on file  Tobacco Use   Smoking status: Former    Current packs/day: 0.00    Types: Cigarettes    Start date: 06/25/1957    Quit date: 1973    Years since quitting: 51.8   Smokeless tobacco: Never   Tobacco comments:    Former smoker 12/08/21  Vaping Use   Vaping status: Never Used  Substance and Sexual Activity   Alcohol use: Yes    Alcohol/week: 14.0 standard drinks of alcohol  Types: 14 Cans of beer per week   Drug use: Never   Sexual activity: Not on file

## 2023-06-09 ENCOUNTER — Telehealth: Payer: Self-pay | Admitting: *Deleted

## 2023-06-09 ENCOUNTER — Ambulatory Visit: Payer: Medicare Other | Attending: Cardiovascular Disease | Admitting: Cardiovascular Disease

## 2023-06-09 ENCOUNTER — Other Ambulatory Visit: Payer: Self-pay | Admitting: Orthopaedic Surgery

## 2023-06-09 ENCOUNTER — Encounter: Payer: Self-pay | Admitting: Cardiovascular Disease

## 2023-06-09 VITALS — BP 134/58 | HR 63 | Ht 68.5 in | Wt 190.0 lb

## 2023-06-09 DIAGNOSIS — R262 Difficulty in walking, not elsewhere classified: Secondary | ICD-10-CM | POA: Diagnosis not present

## 2023-06-09 DIAGNOSIS — M6281 Muscle weakness (generalized): Secondary | ICD-10-CM | POA: Diagnosis not present

## 2023-06-09 DIAGNOSIS — I5022 Chronic systolic (congestive) heart failure: Secondary | ICD-10-CM | POA: Insufficient documentation

## 2023-06-09 DIAGNOSIS — Z96652 Presence of left artificial knee joint: Secondary | ICD-10-CM | POA: Diagnosis not present

## 2023-06-09 DIAGNOSIS — M25562 Pain in left knee: Secondary | ICD-10-CM | POA: Diagnosis not present

## 2023-06-09 NOTE — Progress Notes (Signed)
   PCP: Toma Deiters, MD Primary Cardiologist: Dr Sharrell Ku Primary EP: Dr Thedore Mins is a 77 y.o. male who presents today for routine electrophysiology followup.  Since last being seen in our clinic, the patient reports doing very well.  Today, he denies symptoms of palpitations, chest pain, shortness of breath,  lower extremity edema, dizziness, presyncope, syncope, or ICD shocks.  he has no device related complaints -- no new tenderness, drainage, redness.  He recently underwent a knee replacement and is recovering from it well.  The patient is otherwise without complaint today.      ROS- all systems are reviewed and negative except as per HPI above     Physical Exam: Vitals:   06/09/23 0903  BP: (!) 134/58  Pulse: 63  SpO2: 97%  Weight: 190 lb (86.2 kg)  Height: 5' 8.5" (1.74 m)     Gen: Appears comfortable, well-nourished CV: RRR, no dependent edema The device site is normal -- no tenderness, edema, drainage, redness, threatened erosion.  Pulm: breathing easily   ICD interrogation- reviewed in detail today,  See PACEART report  EKG Interpretation Date/Time:  Friday June 09 2023 09:08:42 EST Ventricular Rate:  63 PR Interval:  182 QRS Duration:  154 QT Interval:  490 QTC Calculation: 501 R Axis:   259  Text Interpretation: AV dual-paced rhythm Biventricular pacemaker detected When compared with ECG of 06-Dec-2022 09:11, No significant change was found Confirmed by York Pellant [phone]) on 06/09/2023 9:57:30 AM   Wt Readings from Last 3 Encounters:  06/09/23 190 lb (86.2 kg)  06/07/23 182 lb (82.6 kg)  06/05/23 182 lb (82.6 kg)    Assessment and Plan:  Chronic systolic dysfunction/ CAD/ ischemic CM s/p CABG 1998 euvolemic today Stable on an appropriate medical regimen Normal ICD function with good response to CRT See Pace Art report No changes today he is not device dependant today followed in ICM device clinic   Persistent atrial  fibrillation Maintaining sinus rhythm with tikosyn AF burden is 0 % - with the exception of one day, May 13 We discussed the importance of close folloow-up while on tikosyn to avoid toxicity with this medicine Chads2vasc score is 4.  Continue coumadin Labs 05/30/2023 reviewed   HTN Elevated today Home Bps show good control.   Risks, benefits and potential toxicities for medications prescribed and/or refilled reviewed with patient today.   Maurice Small, MD  06/09/2023 9:43 AM

## 2023-06-09 NOTE — Patient Instructions (Addendum)

## 2023-06-09 NOTE — Telephone Encounter (Signed)
Patient's son called and states they returned back to PT today and it went very well. They did want to know what your thoughts about a muscle relaxer to help with tightness and spasming. Therapy had mentioned to them I believe. Thank you.

## 2023-06-09 NOTE — Telephone Encounter (Signed)
I called patient and advised. 

## 2023-06-12 DIAGNOSIS — R262 Difficulty in walking, not elsewhere classified: Secondary | ICD-10-CM | POA: Diagnosis not present

## 2023-06-12 DIAGNOSIS — Z96652 Presence of left artificial knee joint: Secondary | ICD-10-CM | POA: Diagnosis not present

## 2023-06-12 DIAGNOSIS — M6281 Muscle weakness (generalized): Secondary | ICD-10-CM | POA: Diagnosis not present

## 2023-06-12 DIAGNOSIS — M25562 Pain in left knee: Secondary | ICD-10-CM | POA: Diagnosis not present

## 2023-06-14 ENCOUNTER — Telehealth: Payer: Self-pay | Admitting: Radiology

## 2023-06-14 ENCOUNTER — Other Ambulatory Visit: Payer: Self-pay | Admitting: Orthopaedic Surgery

## 2023-06-14 ENCOUNTER — Encounter: Payer: Self-pay | Admitting: Orthopaedic Surgery

## 2023-06-14 ENCOUNTER — Ambulatory Visit (INDEPENDENT_AMBULATORY_CARE_PROVIDER_SITE_OTHER): Payer: Medicare Other | Admitting: Orthopaedic Surgery

## 2023-06-14 VITALS — Ht 68.5 in | Wt 190.0 lb

## 2023-06-14 DIAGNOSIS — R262 Difficulty in walking, not elsewhere classified: Secondary | ICD-10-CM | POA: Diagnosis not present

## 2023-06-14 DIAGNOSIS — M25562 Pain in left knee: Secondary | ICD-10-CM | POA: Diagnosis not present

## 2023-06-14 DIAGNOSIS — Z96652 Presence of left artificial knee joint: Secondary | ICD-10-CM

## 2023-06-14 DIAGNOSIS — M6281 Muscle weakness (generalized): Secondary | ICD-10-CM | POA: Diagnosis not present

## 2023-06-14 MED ORDER — OXYCODONE-ACETAMINOPHEN 5-325 MG PO TABS: 1.0000 | ORAL_TABLET | Freq: Four times a day (QID) | ORAL | 0 refills | Status: AC | PRN

## 2023-06-14 NOTE — Progress Notes (Signed)
Post-Op Visit Note   Patient: Brian Bruce           Date of Birth: Aug 20, 1945           MRN: [id number] Visit Date: 06/14/2023 PCP: Toma Deiters, MD   Assessment & Plan: Post knee arthroplasty patient is on Coumadin he had problems with hemarthrosis and stiffness.  He is wearing long support stockings which are helping ecchymosis in his thighs resolving swelling is down and staples are harvested today Steri-Strips applied.  He can drive once he is just taking his pain medication at night.  He still has about a half bottle he will call if he needs more.  Continuing outpatient therapy and I will recheck him again in 4 weeks.  He is making good progress.  Chief Complaint:  Chief Complaint  Patient presents with   Left Knee - Routine Post Op, Follow-up    05/29/2023 Left TKA   Visit Diagnoses:  1. S/P total knee arthroplasty, left     Plan: Return in 4 weeks.  Follow-Up Instructions: Return in about 4 weeks (around 07/12/2023).   Orders:  No orders of the defined types were placed in this encounter.  No orders of the defined types were placed in this encounter.   Imaging: No results found.  PMFS History: Patient Active Problem List   Diagnosis Date Noted   OA (osteoarthritis) of knee 05/29/2023   S/P total knee arthroplasty, left 05/29/2023   Bilateral primary osteoarthritis of knee 07/14/2022   Persistent atrial fibrillation (HCC) 07/01/2019   Hypercoagulable state due to persistent atrial fibrillation (HCC) 07/01/2019   Chronic systolic dysfunction of left ventricle 05/16/2019   Chronic systolic heart failure (HCC) 03/07/2019   Coronary artery disease 03/07/2019   Past Medical History:  Diagnosis Date   AICD (automatic cardioverter/defibrillator) present    Arthritis    Asthma    CHF (congestive heart failure) (HCC)    Coronary artery disease    Diabetes mellitus without complication (HCC)    Hypertension    Ischemic cardiomyopathy    LBBB (left bundle  branch block)    Persistent atrial fibrillation (HCC)     Family History  Problem Relation Age of Onset   Cancer Mother    Heart failure Father     Past Surgical History:  Procedure Laterality Date   BIV ICD INSERTION CRT-D N/A 05/16/2019   Procedure: BIV ICD INSERTION CRT-D;  Surgeon: Hillis Range, MD;  [facility];  Service: Cardiovascular;  Laterality: N/A;   CORONARY ARTERY BYPASS GRAFT  1998   4-vessel   RIGHT/LEFT HEART CATH AND CORONARY/GRAFT ANGIOGRAPHY N/A 03/07/2019   Procedure: RIGHT/LEFT HEART CATH AND CORONARY/GRAFT ANGIOGRAPHY;  Surgeon: Yvonne Kendall, MD;  [facility];  Service: Cardiovascular;  Laterality: N/A;   TOTAL KNEE ARTHROPLASTY Left 05/29/2023   Procedure: LEFT TOTAL KNEE ARTHROPLASTY;  Surgeon: Eldred Manges, MD;  Location: [facility];  Service: Orthopedics;  Laterality: Left;  Needs RNFA   Social History   Occupational History   Not on file  Tobacco Use   Smoking status: Former    Current packs/day: 0.00    Types: Cigarettes    Start date: 06/25/1957    Quit date: 1973    Years since quitting: 51.9   Smokeless tobacco: Never   Tobacco comments:    Former smoker 12/08/21  Vaping Use   Vaping status: Never Used  Substance and Sexual Activity   Alcohol use: Yes  Alcohol/week: 14.0 standard drinks of alcohol    Types: 14 Cans of beer per week   Drug use: Never   Sexual activity: Not on file

## 2023-06-14 NOTE — Telephone Encounter (Signed)
Patient called back and states that he only has a few days left worth of his pain medication and will need a refill. He asks that you please send it to [facility] in [place].  Please advise.

## 2023-06-14 NOTE — Telephone Encounter (Signed)
I called patient and advised. 

## 2023-06-16 ENCOUNTER — Telehealth: Payer: Self-pay | Admitting: *Deleted

## 2023-06-16 DIAGNOSIS — Z96652 Presence of left artificial knee joint: Secondary | ICD-10-CM | POA: Diagnosis not present

## 2023-06-16 DIAGNOSIS — M25562 Pain in left knee: Secondary | ICD-10-CM | POA: Diagnosis not present

## 2023-06-16 DIAGNOSIS — M6281 Muscle weakness (generalized): Secondary | ICD-10-CM | POA: Diagnosis not present

## 2023-06-16 DIAGNOSIS — R262 Difficulty in walking, not elsewhere classified: Secondary | ICD-10-CM | POA: Diagnosis not present

## 2023-06-16 NOTE — Telephone Encounter (Signed)
Ortho bundle call. Patient reports therapy is going well and has improved with pain and swelling. No new needs. Saw Dr. Ophelia Charter in [place] this week. Will continue to follow for any other needs.

## 2023-06-19 ENCOUNTER — Ambulatory Visit: Payer: Medicare Other | Attending: Cardiovascular Disease

## 2023-06-19 DIAGNOSIS — R262 Difficulty in walking, not elsewhere classified: Secondary | ICD-10-CM | POA: Diagnosis not present

## 2023-06-19 DIAGNOSIS — M6281 Muscle weakness (generalized): Secondary | ICD-10-CM | POA: Diagnosis not present

## 2023-06-19 DIAGNOSIS — Z95810 Presence of automatic (implantable) cardiac defibrillator: Secondary | ICD-10-CM | POA: Diagnosis not present

## 2023-06-19 DIAGNOSIS — I5022 Chronic systolic (congestive) heart failure: Secondary | ICD-10-CM

## 2023-06-19 DIAGNOSIS — Z96652 Presence of left artificial knee joint: Secondary | ICD-10-CM | POA: Diagnosis not present

## 2023-06-19 DIAGNOSIS — M25562 Pain in left knee: Secondary | ICD-10-CM | POA: Diagnosis not present

## 2023-06-20 NOTE — Progress Notes (Signed)
EPIC Encounter for ICM Monitoring  Patient Name: Brian Bruce is a 77 y.o. male Date: 06/20/2023 Primary Care Physican: Toma Deiters, MD Primary Cardiologist: [facility] Electrophysiologist: Mealor Bi-V Pacing: 98%            11/25/2022 Weight: 183 lbs 01/31/2023 Weight: 182 lbs  02/07/2023 Weight: 182 lbs 04/14/2023 Weight: 182-183 lbs     AT/AF Burden 0% (taking Warfarin)                                                          Spoke with patient and heart failure questions reviewed.  Transmission results reviewed.  Pt asymptomatic for fluid accumulation.  He is recovering from total knee replacement done on 10/28.    CorVue thoracic impedance suggesting normal fluid levels with the exception of possible fluid accumulation 10/29-11/4.   Prescribed: Torsemide 20 mg take 1 tablet daily.     Labs: 12/06/2022 Creatinine 0.72, BUN 9, Potassium 4.1, Sodium 134, GFR >60 A complete set of results can be found in Results Review.   Recommendations:   No changes and encouraged to call if experiencing any fluid symptoms.   Follow-up plan: ICM clinic phone appointment on 07/24/2023.  91 day device clinic remote transmission 08/09/2022.     EP/Cardiology Office Visits:  12/01/2023 with Dr. Nelly Laurence.     Copy of ICM check sent to Dr. Nelly Laurence.   3 month ICM trend: 06/19/2023.    12-14 Month ICM trend:     Karie Soda, RN 06/20/2023 7:26 AM

## 2023-06-21 DIAGNOSIS — M25562 Pain in left knee: Secondary | ICD-10-CM | POA: Diagnosis not present

## 2023-06-21 DIAGNOSIS — M6281 Muscle weakness (generalized): Secondary | ICD-10-CM | POA: Diagnosis not present

## 2023-06-21 DIAGNOSIS — Z96652 Presence of left artificial knee joint: Secondary | ICD-10-CM | POA: Diagnosis not present

## 2023-06-21 DIAGNOSIS — R262 Difficulty in walking, not elsewhere classified: Secondary | ICD-10-CM | POA: Diagnosis not present

## 2023-06-23 DIAGNOSIS — Z96652 Presence of left artificial knee joint: Secondary | ICD-10-CM | POA: Diagnosis not present

## 2023-06-23 DIAGNOSIS — R262 Difficulty in walking, not elsewhere classified: Secondary | ICD-10-CM | POA: Diagnosis not present

## 2023-06-23 DIAGNOSIS — M25562 Pain in left knee: Secondary | ICD-10-CM | POA: Diagnosis not present

## 2023-06-23 DIAGNOSIS — Z7901 Long term (current) use of anticoagulants: Secondary | ICD-10-CM | POA: Diagnosis not present

## 2023-06-23 DIAGNOSIS — M6281 Muscle weakness (generalized): Secondary | ICD-10-CM | POA: Diagnosis not present

## 2023-06-26 ENCOUNTER — Telehealth: Payer: Self-pay | Admitting: Cardiovascular Disease

## 2023-06-26 DIAGNOSIS — M6281 Muscle weakness (generalized): Secondary | ICD-10-CM | POA: Diagnosis not present

## 2023-06-26 DIAGNOSIS — M25562 Pain in left knee: Secondary | ICD-10-CM | POA: Diagnosis not present

## 2023-06-26 DIAGNOSIS — R262 Difficulty in walking, not elsewhere classified: Secondary | ICD-10-CM | POA: Diagnosis not present

## 2023-06-26 DIAGNOSIS — H35373 Puckering of macula, bilateral: Secondary | ICD-10-CM | POA: Diagnosis not present

## 2023-06-26 DIAGNOSIS — Z96652 Presence of left artificial knee joint: Secondary | ICD-10-CM | POA: Diagnosis not present

## 2023-06-26 MED ORDER — MAGNESIUM OXIDE -MG SUPPLEMENT 400 (240 MG) MG PO TABS: 1.0000 | ORAL_TABLET | Freq: Two times a day (BID) | ORAL | 1 refills | Status: DC

## 2023-06-26 NOTE — Telephone Encounter (Signed)
*  STAT* If patient is at the pharmacy, call can be transferred to refill team.   1. Which medications need to be refilled? (please list name of each medication and dose if known)   MAGnesium-Oxide 400 (240 Mg) MG tablet      4. Which pharmacy/location (including street and city if local pharmacy) is medication to be sent to?  [facility]   5. Do they need a 30 day or 90 day supply?   90 day

## 2023-06-28 DIAGNOSIS — Z96652 Presence of left artificial knee joint: Secondary | ICD-10-CM | POA: Diagnosis not present

## 2023-06-28 DIAGNOSIS — M25562 Pain in left knee: Secondary | ICD-10-CM | POA: Diagnosis not present

## 2023-06-28 DIAGNOSIS — M6281 Muscle weakness (generalized): Secondary | ICD-10-CM | POA: Diagnosis not present

## 2023-06-28 DIAGNOSIS — R262 Difficulty in walking, not elsewhere classified: Secondary | ICD-10-CM | POA: Diagnosis not present

## 2023-07-03 DIAGNOSIS — Z96652 Presence of left artificial knee joint: Secondary | ICD-10-CM | POA: Diagnosis not present

## 2023-07-03 DIAGNOSIS — R262 Difficulty in walking, not elsewhere classified: Secondary | ICD-10-CM | POA: Diagnosis not present

## 2023-07-03 DIAGNOSIS — M25562 Pain in left knee: Secondary | ICD-10-CM | POA: Diagnosis not present

## 2023-07-03 DIAGNOSIS — M6281 Muscle weakness (generalized): Secondary | ICD-10-CM | POA: Diagnosis not present

## 2023-07-05 DIAGNOSIS — M25562 Pain in left knee: Secondary | ICD-10-CM | POA: Diagnosis not present

## 2023-07-05 DIAGNOSIS — R262 Difficulty in walking, not elsewhere classified: Secondary | ICD-10-CM | POA: Diagnosis not present

## 2023-07-05 DIAGNOSIS — M6281 Muscle weakness (generalized): Secondary | ICD-10-CM | POA: Diagnosis not present

## 2023-07-05 DIAGNOSIS — Z96652 Presence of left artificial knee joint: Secondary | ICD-10-CM | POA: Diagnosis not present

## 2023-07-07 DIAGNOSIS — M6281 Muscle weakness (generalized): Secondary | ICD-10-CM | POA: Diagnosis not present

## 2023-07-07 DIAGNOSIS — Z96652 Presence of left artificial knee joint: Secondary | ICD-10-CM | POA: Diagnosis not present

## 2023-07-07 DIAGNOSIS — M25562 Pain in left knee: Secondary | ICD-10-CM | POA: Diagnosis not present

## 2023-07-07 DIAGNOSIS — R262 Difficulty in walking, not elsewhere classified: Secondary | ICD-10-CM | POA: Diagnosis not present

## 2023-07-10 DIAGNOSIS — Z96652 Presence of left artificial knee joint: Secondary | ICD-10-CM | POA: Diagnosis not present

## 2023-07-10 DIAGNOSIS — M6281 Muscle weakness (generalized): Secondary | ICD-10-CM | POA: Diagnosis not present

## 2023-07-10 DIAGNOSIS — R262 Difficulty in walking, not elsewhere classified: Secondary | ICD-10-CM | POA: Diagnosis not present

## 2023-07-10 DIAGNOSIS — M25562 Pain in left knee: Secondary | ICD-10-CM | POA: Diagnosis not present

## 2023-07-12 ENCOUNTER — Ambulatory Visit (INDEPENDENT_AMBULATORY_CARE_PROVIDER_SITE_OTHER): Payer: Medicare Other | Admitting: Orthopaedic Surgery

## 2023-07-12 ENCOUNTER — Encounter: Payer: Self-pay | Admitting: Orthopaedic Surgery

## 2023-07-12 VITALS — Ht 68.5 in | Wt 190.0 lb

## 2023-07-12 DIAGNOSIS — M6281 Muscle weakness (generalized): Secondary | ICD-10-CM | POA: Diagnosis not present

## 2023-07-12 DIAGNOSIS — Z96652 Presence of left artificial knee joint: Secondary | ICD-10-CM | POA: Diagnosis not present

## 2023-07-12 DIAGNOSIS — R262 Difficulty in walking, not elsewhere classified: Secondary | ICD-10-CM | POA: Diagnosis not present

## 2023-07-12 DIAGNOSIS — M25562 Pain in left knee: Secondary | ICD-10-CM | POA: Diagnosis not present

## 2023-07-12 NOTE — Progress Notes (Signed)
Post-Op Visit Note   Patient: Brian Bruce           Date of Birth: Jun 08, 1946           MRN: [id number] Visit Date: 07/12/2023 PCP: Toma Deiters, MD   Assessment & Plan: Follow-up total knee arthroplasty still in therapy has [place] has to be pulled and they have stopped his Coumadin.  Tooth extraction date is 1217.  He was wearing long TED hose he is now got some pitting edema needs to go back to using the single long TED hose on the left.  Still lacks about 5 degrees reaching full extension and needs to keep working on this at home.  Strength is improving some soreness over the trochanter and some on the ankle with some pitting edema.  Trace knee effusion.  Chief Complaint:  Chief Complaint  Patient presents with   Left Knee - Follow-up, Routine Post Op    05/29/2023 Left TKA   Visit Diagnoses:  1. S/P total knee arthroplasty, left     Plan: Continue working on extension and quad strengthening his flexion is great.  Recheck for likely final visit 2 months.  Follow-Up Instructions: Return in about 2 months (around 09/12/2023).   Orders:  No orders of the defined types were placed in this encounter.  No orders of the defined types were placed in this encounter.   Imaging: No results found.  PMFS History: Patient Active Problem List   Diagnosis Date Noted   OA (osteoarthritis) of knee 05/29/2023   S/P total knee arthroplasty, left 05/29/2023   Bilateral primary osteoarthritis of knee 07/14/2022   Persistent atrial fibrillation (HCC) 07/01/2019   Hypercoagulable state due to persistent atrial fibrillation (HCC) 07/01/2019   Chronic systolic dysfunction of left ventricle 05/16/2019   Chronic systolic heart failure (HCC) 03/07/2019   Coronary artery disease 03/07/2019   Past Medical History:  Diagnosis Date   AICD (automatic cardioverter/defibrillator) present    Arthritis    Asthma    CHF (congestive heart failure) (HCC)    Coronary artery disease     Diabetes mellitus without complication (HCC)    Hypertension    Ischemic cardiomyopathy    LBBB (left bundle branch block)    Persistent atrial fibrillation (HCC)     Family History  Problem Relation Age of Onset   Cancer Mother    Heart failure Father     Past Surgical History:  Procedure Laterality Date   BIV ICD INSERTION CRT-D N/A 05/16/2019   Procedure: BIV ICD INSERTION CRT-D;  Surgeon: Hillis Range, MD;  [facility];  Service: Cardiovascular;  Laterality: N/A;   CORONARY ARTERY BYPASS GRAFT  1998   4-vessel   RIGHT/LEFT HEART CATH AND CORONARY/GRAFT ANGIOGRAPHY N/A 03/07/2019   Procedure: RIGHT/LEFT HEART CATH AND CORONARY/GRAFT ANGIOGRAPHY;  Surgeon: Yvonne Kendall, MD;  [facility];  Service: Cardiovascular;  Laterality: N/A;   TOTAL KNEE ARTHROPLASTY Left 05/29/2023   Procedure: LEFT TOTAL KNEE ARTHROPLASTY;  Surgeon: Eldred Manges, MD;  Location: [facility];  Service: Orthopedics;  Laterality: Left;  Needs RNFA   Social History   Occupational History   Not on file  Tobacco Use   Smoking status: Former    Current packs/day: 0.00    Types: Cigarettes    Start date: 06/25/1957    Quit date: 1973    Years since quitting: 51.9   Smokeless tobacco: Never   Tobacco comments:    Former smoker  12/08/21  Vaping Use   Vaping status: Never Used  Substance and Sexual Activity   Alcohol use: Yes    Alcohol/week: 14.0 standard drinks of alcohol    Types: 14 Cans of beer per week   Drug use: Never   Sexual activity: Not on file

## 2023-07-14 DIAGNOSIS — M6281 Muscle weakness (generalized): Secondary | ICD-10-CM | POA: Diagnosis not present

## 2023-07-14 DIAGNOSIS — Z96652 Presence of left artificial knee joint: Secondary | ICD-10-CM | POA: Diagnosis not present

## 2023-07-14 DIAGNOSIS — R262 Difficulty in walking, not elsewhere classified: Secondary | ICD-10-CM | POA: Diagnosis not present

## 2023-07-14 DIAGNOSIS — M25562 Pain in left knee: Secondary | ICD-10-CM | POA: Diagnosis not present

## 2023-07-24 ENCOUNTER — Ambulatory Visit: Payer: Medicare Other | Attending: Cardiovascular Disease

## 2023-07-24 DIAGNOSIS — Z95810 Presence of automatic (implantable) cardiac defibrillator: Secondary | ICD-10-CM

## 2023-07-24 DIAGNOSIS — I5022 Chronic systolic (congestive) heart failure: Secondary | ICD-10-CM

## 2023-07-27 ENCOUNTER — Other Ambulatory Visit: Payer: Self-pay | Admitting: Cardiovascular Disease

## 2023-07-27 NOTE — Progress Notes (Signed)
EPIC Encounter for ICM Monitoring  Patient Name: Brian Bruce is a 77 y.o. male Date: 07/27/2023 Primary Care Physican: Toma Deiters, MD Primary Cardiologist: [facility] Cardiology Electrophysiologist: Mealor Bi-V Pacing: 98%            11/25/2022 Weight: 183 lbs 01/31/2023 Weight: 182 lbs  02/07/2023 Weight: 182 lbs 04/14/2023 Weight: 182-183 lbs     AT/AF Burden 0% (taking Warfarin)                                                          Attempted call to patient and unable to reach.  Left detailed message per DPR regarding transmission.  Transmission results reviewed.     CorVue thoracic impedance suggesting normal fluid levels with the exception of possible fluid accumulation 11/30-12/7.   Prescribed: Torsemide 20 mg take 1 tablet daily.     Labs: 12/06/2022 Creatinine 0.72, BUN 9, Potassium 4.1, Sodium 134, GFR >60 A complete set of results can be found in Results Review.   Recommendations:   Unable to reach.     Follow-up plan: ICM clinic phone appointment on 08/28/2023.  91 day device clinic remote transmission 08/09/2022.     EP/Cardiology Office Visits:  12/01/2023 with Dr. Nelly Laurence.     Copy of ICM check sent to Dr. Nelly Laurence.   3 month ICM trend: 07/24/2023.    12-14 Month ICM trend:     Karie Soda, RN 07/27/2023 2:55 PM

## 2023-08-04 DIAGNOSIS — Z7901 Long term (current) use of anticoagulants: Secondary | ICD-10-CM | POA: Diagnosis not present

## 2023-08-10 ENCOUNTER — Ambulatory Visit (INDEPENDENT_AMBULATORY_CARE_PROVIDER_SITE_OTHER): Payer: Medicare Other

## 2023-08-10 DIAGNOSIS — I255 Ischemic cardiomyopathy: Secondary | ICD-10-CM

## 2023-08-10 LAB — CUP PACEART REMOTE DEVICE CHECK
Battery Remaining Longevity: 36 mo
Battery Remaining Percentage: 43 %
Battery Voltage: 2.93 V
Brady Statistic AP VP Percent: 86 %
Brady Statistic AP VS Percent: 1.6 %
Brady Statistic AS VP Percent: 12 %
Brady Statistic AS VS Percent: 1 %
Brady Statistic RA Percent Paced: 87 %
Date Time Interrogation Session: 20250109020049
HighPow Impedance: 48 Ohm
Implantable Lead Connection Status: 753985
Implantable Lead Connection Status: 753985
Implantable Lead Connection Status: 753985
Implantable Lead Implant Date: 20201015
Implantable Lead Implant Date: 20201015
Implantable Lead Implant Date: 20201015
Implantable Lead Location: 753858
Implantable Lead Location: 753859
Implantable Lead Location: 753860
Implantable Pulse Generator Implant Date: 20201015
Lead Channel Impedance Value: 380 Ohm
Lead Channel Impedance Value: 380 Ohm
Lead Channel Impedance Value: 440 Ohm
Lead Channel Pacing Threshold Amplitude: 0.75 V
Lead Channel Pacing Threshold Amplitude: 1 V
Lead Channel Pacing Threshold Amplitude: 1.375 V
Lead Channel Pacing Threshold Pulse Width: 0.5 ms
Lead Channel Pacing Threshold Pulse Width: 0.5 ms
Lead Channel Pacing Threshold Pulse Width: 0.8 ms
Lead Channel Sensing Intrinsic Amplitude: 1.4 mV
Lead Channel Sensing Intrinsic Amplitude: 12 mV
Lead Channel Setting Pacing Amplitude: 1.75 V
Lead Channel Setting Pacing Amplitude: 1.875
Lead Channel Setting Pacing Amplitude: 2 V
Lead Channel Setting Pacing Pulse Width: 0.5 ms
Lead Channel Setting Pacing Pulse Width: 0.8 ms
Lead Channel Setting Sensing Sensitivity: 0.5 mV
Pulse Gen Serial Number: 111006847
Zone Setting Status: 755011

## 2023-08-23 DIAGNOSIS — I10 Essential (primary) hypertension: Secondary | ICD-10-CM | POA: Diagnosis not present

## 2023-08-23 DIAGNOSIS — E1159 Type 2 diabetes mellitus with other circulatory complications: Secondary | ICD-10-CM | POA: Diagnosis not present

## 2023-08-23 DIAGNOSIS — M1712 Unilateral primary osteoarthritis, left knee: Secondary | ICD-10-CM | POA: Diagnosis not present

## 2023-08-23 DIAGNOSIS — I5022 Chronic systolic (congestive) heart failure: Secondary | ICD-10-CM | POA: Diagnosis not present

## 2023-08-23 DIAGNOSIS — Z1331 Encounter for screening for depression: Secondary | ICD-10-CM | POA: Diagnosis not present

## 2023-08-23 DIAGNOSIS — E7849 Other hyperlipidemia: Secondary | ICD-10-CM | POA: Diagnosis not present

## 2023-08-23 DIAGNOSIS — Z7901 Long term (current) use of anticoagulants: Secondary | ICD-10-CM | POA: Diagnosis not present

## 2023-08-23 DIAGNOSIS — Z0000 Encounter for general adult medical examination without abnormal findings: Secondary | ICD-10-CM | POA: Diagnosis not present

## 2023-08-28 ENCOUNTER — Ambulatory Visit: Payer: Medicare Other | Attending: Cardiovascular Disease

## 2023-08-28 DIAGNOSIS — Z95810 Presence of automatic (implantable) cardiac defibrillator: Secondary | ICD-10-CM

## 2023-08-28 DIAGNOSIS — I5022 Chronic systolic (congestive) heart failure: Secondary | ICD-10-CM

## 2023-08-30 NOTE — Progress Notes (Signed)
EPIC Encounter for ICM Monitoring  Patient Name: Brian Bruce is a 78 y.o. male Date: 08/30/2023 Primary Care Physican: Toma Deiters, MD Primary Cardiologist: [facility]iology Electrophysiologist: Mealor Bi-V Pacing: 98%            11/25/2022 Weight: 183 lbs 01/31/2023 Weight: 182 lbs  02/07/2023 Weight: 182 lbs 04/14/2023 Weight: 182-183 lbs  08/30/2023 Weight: 178 lb   AT/AF Burden 0% (taking Warfarin)                                                          Spoke with patient and heart failure questions reviewed.  Transmission results reviewed.  Pt asymptomatic for fluid accumulation.  Reports feeling well at this time and voices no complaints.      CorVue thoracic impedance suggesting normal fluid levels within the last month.   Prescribed: Torsemide 20 mg take 1 tablet daily.     Labs: 05/30/2023 Creatinine 0.98, BUN 20, Potassium 4.1, Sodium 133, GFR >60 05/19/2023 Creatinine 0.70, BUN 11, Potassium 4.0, Sodium 138, GFR >60 12/06/2022 Creatinine 0.72, BUN 9,   Potassium 4.1, Sodium 134, GFR >60 A complete set of results can be found in Results Review.   Recommendations:   No changes and encouraged to call if experiencing any fluid symptoms.   Follow-up plan: ICM clinic phone appointment on 10/02/2023.  91 day device clinic remote transmission 11/07/2022.     EP/Cardiology Office Visits:  12/01/2023 with Dr. Nelly Laurence.     Copy of ICM check sent to Dr. Nelly Laurence.   3 month ICM trend: 08/28/2023.    12-14 Month ICM trend:     Karie Soda, RN 08/30/2023 10:35 AM

## 2023-09-07 ENCOUNTER — Ambulatory Visit: Payer: Medicare Other | Admitting: Orthopaedic Surgery

## 2023-09-07 ENCOUNTER — Encounter: Payer: Self-pay | Admitting: Orthopaedic Surgery

## 2023-09-07 DIAGNOSIS — Z96652 Presence of left artificial knee joint: Secondary | ICD-10-CM | POA: Diagnosis not present

## 2023-09-07 NOTE — Progress Notes (Signed)
 Office Visit Note   Patient: Brian Bruce           Date of Birth: 07/01/1946           MRN: [id number] Visit Date: 09/07/2023              Requested by: Orpha Yancey LABOR, MD [address] [place],  [place] [address] PCP: Orpha Yancey LABOR, MD   Assessment & Plan: Visit Diagnoses:  1. S/P total knee arthroplasty, left     Plan: Patient will continue work on quad strengthening working on last few degrees of full extension.  He is happy with the improvement with his pain versus preop.  When he has increased problems his opposite knee could see one of my partners about total knee arthroplasty.  Follow-Up Instructions: No follow-ups on file.   Orders:  No orders of the defined types were placed in this encounter.  No orders of the defined types were placed in this encounter.     Procedures: No procedures performed   Clinical Data: No additional findings.   Subjective: Chief Complaint  Patient presents with   Left Knee - Routine Post Op, Follow-up    05/29/2023 Left TKA    HPI post left total knee arthroplasty 3 and half months postop.  Swelling is down he has some pain medially at times notes that he cannot quite get his knee all the way straight and does prone positioning with several cans but only makes it 3 to 5 minutes.  He sleeping through the night not on any pain medication.  Additional problems with history of atrial failure heart failure.  Opposite right knee bothers him slightly.  Review of Systems all systems updated unchanged   Objective: Vital Signs: There were no vitals taken for this visit.  Physical Exam Constitutional:      Appearance: He is well-developed.  HENT:     Head: Normocephalic and atraumatic.     Right Ear: External ear normal.     Left Ear: External ear normal.  Eyes:     Pupils: Pupils are equal, round, and reactive to light.  Neck:     Thyroid: No thyromegaly.     Trachea: No tracheal deviation.  Cardiovascular:     Rate and  Rhythm: Normal rate.  Pulmonary:     Effort: Pulmonary effort is normal.     Breath sounds: No wheezing.  Abdominal:     General: Bowel sounds are normal.     Palpations: Abdomen is soft.  Musculoskeletal:     Cervical back: Neck supple.  Skin:    General: Skin is warm and dry.     Capillary Refill: Capillary refill takes less than 2 seconds.  Neurological:     Mental Status: He is alert and oriented to person, place, and time.  Psychiatric:        Behavior: Behavior normal.        Thought Content: Thought content normal.        Judgment: Judgment normal.     Ortho Exam right knee swelling well-healed scar he lacks 4 degrees reaching full extension good flexion to 120.  Collateral ligaments are stable.  Good collateral balance in flexion extension.  Specialty Comments:  No specialty comments available.  Imaging: No results found.   PMFS History: Patient Active Problem List   Diagnosis Date Noted   OA (osteoarthritis) of knee 05/29/2023   S/P total knee arthroplasty, left 05/29/2023   Bilateral primary osteoarthritis of  knee 07/14/2022   Persistent atrial fibrillation (HCC) 07/01/2019   Hypercoagulable state due to persistent atrial fibrillation (HCC) 07/01/2019   Chronic systolic dysfunction of left ventricle 05/16/2019   Chronic systolic heart failure (HCC) 03/07/2019   Coronary artery disease 03/07/2019   Past Medical History:  Diagnosis Date   AICD (automatic cardioverter/defibrillator) present    Arthritis    Asthma    CHF (congestive heart failure) (HCC)    Coronary artery disease    Diabetes mellitus without complication (HCC)    Hypertension    Ischemic cardiomyopathy    LBBB (left bundle branch block)    Persistent atrial fibrillation (HCC)     Family History  Problem Relation Age of Onset   Cancer Mother    Heart failure Father     Past Surgical History:  Procedure Laterality Date   BIV ICD INSERTION CRT-D N/A 05/16/2019   Procedure: BIV ICD  INSERTION CRT-D;  Surgeon: Kelsie Agent, MD;  [facility];  Service: Cardiovascular;  Laterality: N/A;   CORONARY ARTERY BYPASS GRAFT  1998   4-vessel   RIGHT/LEFT HEART CATH AND CORONARY/GRAFT ANGIOGRAPHY N/A 03/07/2019   Procedure: RIGHT/LEFT HEART CATH AND CORONARY/GRAFT ANGIOGRAPHY;  Surgeon: Mady Bruckner, MD;  [facility];  Service: Cardiovascular;  Laterality: N/A;   TOTAL KNEE ARTHROPLASTY Left 05/29/2023   Procedure: LEFT TOTAL KNEE ARTHROPLASTY;  Surgeon: Barbarann Oneil BROCKS, MD;  Location: [facility];  Service: Orthopedics;  Laterality: Left;  Needs RNFA   Social History   Occupational History   Not on file  Tobacco Use   Smoking status: Former    Current packs/day: 0.00    Types: Cigarettes    Start date: 06/25/1957    Quit date: 1973    Years since quitting: 52.1   Smokeless tobacco: Never   Tobacco comments:    Former smoker 12/08/21  Vaping Use   Vaping status: Never Used  Substance and Sexual Activity   Alcohol use: Yes    Alcohol/week: 14.0 standard drinks of alcohol    Types: 14 Cans of beer per week   Drug use: Never   Sexual activity: Not on file

## 2023-09-19 NOTE — Progress Notes (Signed)
 Remote ICD transmission.

## 2023-09-22 DIAGNOSIS — Z7901 Long term (current) use of anticoagulants: Secondary | ICD-10-CM | POA: Diagnosis not present

## 2023-10-02 ENCOUNTER — Ambulatory Visit: Payer: Medicare Other | Attending: Cardiovascular Disease

## 2023-10-02 DIAGNOSIS — Z95810 Presence of automatic (implantable) cardiac defibrillator: Secondary | ICD-10-CM | POA: Diagnosis not present

## 2023-10-02 DIAGNOSIS — I272 Pulmonary hypertension, unspecified: Secondary | ICD-10-CM | POA: Diagnosis not present

## 2023-10-02 DIAGNOSIS — I340 Nonrheumatic mitral (valve) insufficiency: Secondary | ICD-10-CM | POA: Diagnosis not present

## 2023-10-02 DIAGNOSIS — I2510 Atherosclerotic heart disease of native coronary artery without angina pectoris: Secondary | ICD-10-CM | POA: Diagnosis not present

## 2023-10-02 DIAGNOSIS — I4811 Longstanding persistent atrial fibrillation: Secondary | ICD-10-CM | POA: Diagnosis not present

## 2023-10-02 DIAGNOSIS — I5022 Chronic systolic (congestive) heart failure: Secondary | ICD-10-CM

## 2023-10-02 DIAGNOSIS — I350 Nonrheumatic aortic (valve) stenosis: Secondary | ICD-10-CM | POA: Diagnosis not present

## 2023-10-02 DIAGNOSIS — I420 Dilated cardiomyopathy: Secondary | ICD-10-CM | POA: Diagnosis not present

## 2023-10-02 DIAGNOSIS — I361 Nonrheumatic tricuspid (valve) insufficiency: Secondary | ICD-10-CM | POA: Diagnosis not present

## 2023-10-04 NOTE — Progress Notes (Signed)
 EPIC Encounter for ICM Monitoring  Patient Name: Brian Bruce is a 78 y.o. male Date: 10/04/2023 Primary Care Physican: Toma Deiters, MD Primary Cardiologist: [facility] Electrophysiologist: Mealor Bi-V Pacing: 98%            11/25/2022 Weight: 183 lbs 01/31/2023 Weight: 182 lbs  02/07/2023 Weight: 182 lbs 04/14/2023 Weight: 182-183 lbs  08/30/2023 Weight: 178 lb 10/04/2023 Weight: 178 lbs   AT/AF Burden 0% (taking Warfarin)                                                          Spoke with patient and heart failure questions reviewed.  Transmission results reviewed.  Pt asymptomatic for fluid accumulation.  Reports feeling well at this time and voices no complaints.      CorVue thoracic impedance suggesting normal fluid levels within the last month.   Prescribed: Torsemide 20 mg take 1 tablet daily.     Labs: 05/30/2023 Creatinine 0.98, BUN 20, Potassium 4.1, Sodium 133, GFR >60 05/19/2023 Creatinine 0.70, BUN 11, Potassium 4.0, Sodium 138, GFR >60 12/06/2022 Creatinine 0.72, BUN 9,   Potassium 4.1, Sodium 134, GFR >60 A complete set of results can be found in Results Review.   Recommendations:   No changes and encouraged to call if experiencing any fluid symptoms.   Follow-up plan: ICM clinic phone appointment on 11/10/2023.  91 day device clinic remote transmission 11/09/2022.     EP/Cardiology Office Visits:  12/01/2023 with Dr. Nelly Laurence.     Copy of ICM check sent to Dr. Nelly Laurence.   3 month ICM trend: 10/02/2023.    12-14 Month ICM trend:     Karie Soda, RN 10/04/2023 3:45 PM

## 2023-10-12 DIAGNOSIS — I5022 Chronic systolic (congestive) heart failure: Secondary | ICD-10-CM | POA: Diagnosis not present

## 2023-10-12 DIAGNOSIS — I350 Nonrheumatic aortic (valve) stenosis: Secondary | ICD-10-CM | POA: Diagnosis not present

## 2023-10-13 DIAGNOSIS — I517 Cardiomegaly: Secondary | ICD-10-CM | POA: Diagnosis not present

## 2023-10-13 DIAGNOSIS — I5022 Chronic systolic (congestive) heart failure: Secondary | ICD-10-CM | POA: Diagnosis not present

## 2023-10-13 DIAGNOSIS — I350 Nonrheumatic aortic (valve) stenosis: Secondary | ICD-10-CM | POA: Diagnosis not present

## 2023-10-14 ENCOUNTER — Other Ambulatory Visit: Payer: Self-pay | Admitting: Cardiovascular Disease

## 2023-10-16 ENCOUNTER — Ambulatory Visit: Attending: Cardiovascular Disease

## 2023-10-16 DIAGNOSIS — Z95810 Presence of automatic (implantable) cardiac defibrillator: Secondary | ICD-10-CM

## 2023-10-16 DIAGNOSIS — I5022 Chronic systolic (congestive) heart failure: Secondary | ICD-10-CM

## 2023-10-16 NOTE — Progress Notes (Unsigned)
 EPIC Encounter for ICM Monitoring  Patient Name: Brian Bruce is a 78 y.o. male Date: 10/16/2023 Primary Care Physican: Toma Deiters, MD Primary Cardiologist: [facility] Electrophysiologist: Mealor Bi-V Pacing: 98%            11/25/2022 Weight: 183 lbs 01/31/2023 Weight: 182 lbs  02/07/2023 Weight: 182 lbs 04/14/2023 Weight: 182-183 lbs  08/30/2023 Weight: 178 lb 10/04/2023 Weight: 178 lbs   AT/AF Burden 0% (taking Warfarin)                                                          Attempted call to patient and unable to reach.    Transmission results reviewed.    CorVue thoracic impedance suggesting possible fluid accumulation starting 3/14.   Prescribed: Torsemide 20 mg take 1 tablet daily.     Labs: 05/30/2023 Creatinine 0.98, BUN 20, Potassium 4.1, Sodium 133, GFR >60 05/19/2023 Creatinine 0.70, BUN 11, Potassium 4.0, Sodium 138, GFR >60 12/06/2022 Creatinine 0.72, BUN 9,   Potassium 4.1, Sodium 134, GFR >60 A complete set of results can be found in Results Review.   Recommendations:   Unable to reach.     Follow-up plan: ICM clinic phone appointment on 10/24/2023 to recheck fluid levels.  91 day device clinic remote transmission 11/09/2022.     EP/Cardiology Office Visits:  12/01/2023 with Dr. Nelly Laurence.     Copy of ICM check sent to Dr. Nelly Laurence.   3 month ICM trend: 10/16/2023.     Karie Soda, RN 10/16/2023 9:42 AM

## 2023-10-16 NOTE — Telephone Encounter (Signed)
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

## 2023-10-17 ENCOUNTER — Telehealth: Payer: Self-pay

## 2023-10-17 NOTE — Telephone Encounter (Signed)
Remote ICM transmission received.  Attempted call to patient regarding ICM remote transmission and voice mail box has not been set up. °

## 2023-10-20 DIAGNOSIS — Z7901 Long term (current) use of anticoagulants: Secondary | ICD-10-CM | POA: Diagnosis not present

## 2023-10-24 ENCOUNTER — Ambulatory Visit: Attending: Cardiovascular Disease

## 2023-10-24 DIAGNOSIS — Z95810 Presence of automatic (implantable) cardiac defibrillator: Secondary | ICD-10-CM

## 2023-10-24 DIAGNOSIS — I5022 Chronic systolic (congestive) heart failure: Secondary | ICD-10-CM

## 2023-10-24 NOTE — Progress Notes (Signed)
 EPIC Encounter for ICM Monitoring  Patient Name: Brian Bruce is a 78 y.o. male Date: 10/24/2023 Primary Care Physican: Toma Deiters, MD Primary Cardiologist: [facility] Electrophysiologist: Mealor Bi-V Pacing: 98%            11/25/2022 Weight: 183 lbs 01/31/2023 Weight: 182 lbs  02/07/2023 Weight: 182 lbs 04/14/2023 Weight: 182-183 lbs  08/30/2023 Weight: 178 lb 10/04/2023 Weight: 178 lbs   AT/AF Burden 0% (taking Warfarin)                                                          Transmission results reviewed.    CorVue thoracic impedance suggesting fluid levels returned to normal.   Prescribed: Torsemide 20 mg take 1 tablet daily.     Labs: 05/30/2023 Creatinine 0.98, BUN 20, Potassium 4.1, Sodium 133, GFR >60 05/19/2023 Creatinine 0.70, BUN 11, Potassium 4.0, Sodium 138, GFR >60 12/06/2022 Creatinine 0.72, BUN 9,   Potassium 4.1, Sodium 134, GFR >60 A complete set of results can be found in Results Review.   Recommendations:  None   Follow-up plan: ICM clinic phone appointment on 11/13/2023.  91 day device clinic remote transmission 11/09/2022.     EP/Cardiology Office Visits:  12/01/2023 with Dr. Nelly Laurence.     Copy of ICM check sent to Dr. Nelly Laurence.   3 month ICM trend: 10/23/2023.    12-14 Month ICM trend:     Karie Soda, RN 10/24/2023 9:56 AM

## 2023-11-04 ENCOUNTER — Other Ambulatory Visit: Payer: Self-pay | Admitting: Physician Assistant

## 2023-11-09 ENCOUNTER — Ambulatory Visit: Payer: Medicare Other

## 2023-11-09 DIAGNOSIS — I255 Ischemic cardiomyopathy: Secondary | ICD-10-CM

## 2023-11-09 LAB — CUP PACEART REMOTE DEVICE CHECK
Battery Remaining Longevity: 32 mo
Battery Remaining Percentage: 40 %
Battery Voltage: 2.93 V
Brady Statistic AP VP Percent: 93 %
Brady Statistic AP VS Percent: 1.6 %
Brady Statistic AS VP Percent: 5.6 %
Brady Statistic AS VS Percent: 1 %
Brady Statistic RA Percent Paced: 94 %
Date Time Interrogation Session: 20250410020306
HighPow Impedance: 50 Ohm
Implantable Lead Connection Status: 753985
Implantable Lead Connection Status: 753985
Implantable Lead Connection Status: 753985
Implantable Lead Implant Date: 20201015
Implantable Lead Implant Date: 20201015
Implantable Lead Implant Date: 20201015
Implantable Lead Location: 753858
Implantable Lead Location: 753859
Implantable Lead Location: 753860
Implantable Pulse Generator Implant Date: 20201015
Lead Channel Impedance Value: 360 Ohm
Lead Channel Impedance Value: 400 Ohm
Lead Channel Impedance Value: 430 Ohm
Lead Channel Pacing Threshold Amplitude: 0.625 V
Lead Channel Pacing Threshold Amplitude: 1 V
Lead Channel Pacing Threshold Amplitude: 1.375 V
Lead Channel Pacing Threshold Pulse Width: 0.5 ms
Lead Channel Pacing Threshold Pulse Width: 0.5 ms
Lead Channel Pacing Threshold Pulse Width: 0.8 ms
Lead Channel Sensing Intrinsic Amplitude: 12 mV
Lead Channel Sensing Intrinsic Amplitude: 2 mV
Lead Channel Setting Pacing Amplitude: 1.625
Lead Channel Setting Pacing Amplitude: 1.875
Lead Channel Setting Pacing Amplitude: 2 V
Lead Channel Setting Pacing Pulse Width: 0.5 ms
Lead Channel Setting Pacing Pulse Width: 0.8 ms
Lead Channel Setting Sensing Sensitivity: 0.5 mV
Pulse Gen Serial Number: 111006847
Zone Setting Status: 755011

## 2023-11-13 ENCOUNTER — Ambulatory Visit: Attending: Cardiovascular Disease

## 2023-11-13 DIAGNOSIS — I5022 Chronic systolic (congestive) heart failure: Secondary | ICD-10-CM | POA: Diagnosis not present

## 2023-11-13 DIAGNOSIS — Z95810 Presence of automatic (implantable) cardiac defibrillator: Secondary | ICD-10-CM | POA: Diagnosis not present

## 2023-11-15 ENCOUNTER — Telehealth: Payer: Self-pay

## 2023-11-15 NOTE — Telephone Encounter (Signed)
Remote ICM transmission received.  Attempted call to patient regarding ICM remote transmission and no message.

## 2023-11-15 NOTE — Progress Notes (Signed)
 EPIC Encounter for ICM Monitoring  Patient Name: Brian Bruce is a 78 y.o. male Date: 11/15/2023 Primary Care Physican: Veda Gerald, MD Primary Cardiologist: [facility] Electrophysiologist: Mealor Bi-V Pacing: 98%            11/25/2022 Weight: 183 lbs 01/31/2023 Weight: 182 lbs  02/07/2023 Weight: 182 lbs 04/14/2023 Weight: 182-183 lbs  08/30/2023 Weight: 178 lb 10/04/2023 Weight: 178 lbs   AT/AF Burden 0% (taking Warfarin)                                                          Attempted call to patient and unable to reach.    Transmission results reviewed.    CorVue thoracic impedance suggesting normal fluid levels since 4/7.  Suggesting possible fluid accumulation from 3/24-3/27 and 3/30-4/7.   Prescribed: Torsemide 20 mg take 1 tablet daily.     Labs: 05/30/2023 Creatinine 0.98, BUN 20, Potassium 4.1, Sodium 133, GFR >60 05/19/2023 Creatinine 0.70, BUN 11, Potassium 4.0, Sodium 138, GFR >60 12/06/2022 Creatinine 0.72, BUN 9,   Potassium 4.1, Sodium 134, GFR >60 A complete set of results can be found in Results Review.   Recommendations:  Unable to reach.     Follow-up plan: ICM clinic phone appointment on 12/18/2023.  91 day device clinic remote transmission 02/08/2024.     EP/Cardiology Office Visits:  12/01/2023 with Dr. Arlester Ladd.     Copy of ICM check sent to Dr. Arlester Ladd.    3 month ICM trend: 11/13/2023.    12-14 Month ICM trend:     [name], RN 11/15/2023 8:40 AM

## 2023-11-27 DIAGNOSIS — I5022 Chronic systolic (congestive) heart failure: Secondary | ICD-10-CM | POA: Diagnosis not present

## 2023-11-27 DIAGNOSIS — M1712 Unilateral primary osteoarthritis, left knee: Secondary | ICD-10-CM | POA: Diagnosis not present

## 2023-11-27 DIAGNOSIS — Z7901 Long term (current) use of anticoagulants: Secondary | ICD-10-CM | POA: Diagnosis not present

## 2023-11-27 DIAGNOSIS — I10 Essential (primary) hypertension: Secondary | ICD-10-CM | POA: Diagnosis not present

## 2023-11-27 DIAGNOSIS — E7849 Other hyperlipidemia: Secondary | ICD-10-CM | POA: Diagnosis not present

## 2023-11-27 DIAGNOSIS — Z0000 Encounter for general adult medical examination without abnormal findings: Secondary | ICD-10-CM | POA: Diagnosis not present

## 2023-11-27 DIAGNOSIS — E1159 Type 2 diabetes mellitus with other circulatory complications: Secondary | ICD-10-CM | POA: Diagnosis not present

## 2023-12-01 ENCOUNTER — Ambulatory Visit: Payer: Medicare Other | Attending: Cardiovascular Disease | Admitting: Cardiovascular Disease

## 2023-12-01 ENCOUNTER — Encounter: Payer: Self-pay | Admitting: Cardiovascular Disease

## 2023-12-01 VITALS — BP 152/58 | HR 62 | Ht 68.5 in | Wt 183.6 lb

## 2023-12-01 DIAGNOSIS — Z95810 Presence of automatic (implantable) cardiac defibrillator: Secondary | ICD-10-CM | POA: Diagnosis not present

## 2023-12-01 DIAGNOSIS — I5022 Chronic systolic (congestive) heart failure: Secondary | ICD-10-CM | POA: Diagnosis not present

## 2023-12-01 DIAGNOSIS — I4819 Other persistent atrial fibrillation: Secondary | ICD-10-CM

## 2023-12-01 LAB — CUP PACEART INCLINIC DEVICE CHECK
Battery Remaining Longevity: 32 mo
Brady Statistic RA Percent Paced: 94 %
Brady Statistic RV Percent Paced: 98 %
Date Time Interrogation Session: 20250502084222
HighPow Impedance: 58.5 Ohm
Implantable Lead Connection Status: 753985
Implantable Lead Connection Status: 753985
Implantable Lead Connection Status: 753985
Implantable Lead Implant Date: 20201015
Implantable Lead Implant Date: 20201015
Implantable Lead Implant Date: 20201015
Implantable Lead Location: 753858
Implantable Lead Location: 753859
Implantable Lead Location: 753860
Implantable Pulse Generator Implant Date: 20201015
Lead Channel Impedance Value: 387.5 Ohm
Lead Channel Impedance Value: 437.5 Ohm
Lead Channel Impedance Value: 500 Ohm
Lead Channel Pacing Threshold Amplitude: 0.75 V
Lead Channel Pacing Threshold Amplitude: 0.75 V
Lead Channel Pacing Threshold Amplitude: 1 V
Lead Channel Pacing Threshold Amplitude: 1 V
Lead Channel Pacing Threshold Amplitude: 1.75 V
Lead Channel Pacing Threshold Amplitude: 1.75 V
Lead Channel Pacing Threshold Pulse Width: 0.5 ms
Lead Channel Pacing Threshold Pulse Width: 0.5 ms
Lead Channel Pacing Threshold Pulse Width: 0.5 ms
Lead Channel Pacing Threshold Pulse Width: 0.5 ms
Lead Channel Pacing Threshold Pulse Width: 0.8 ms
Lead Channel Pacing Threshold Pulse Width: 0.8 ms
Lead Channel Sensing Intrinsic Amplitude: 12 mV
Lead Channel Sensing Intrinsic Amplitude: 2.4 mV
Lead Channel Setting Pacing Amplitude: 1.625
Lead Channel Setting Pacing Amplitude: 1.875
Lead Channel Setting Pacing Amplitude: 2 V
Lead Channel Setting Pacing Pulse Width: 0.5 ms
Lead Channel Setting Pacing Pulse Width: 0.8 ms
Lead Channel Setting Sensing Sensitivity: 0.5 mV
Pulse Gen Serial Number: 111006847
Zone Setting Status: 755011

## 2023-12-01 NOTE — Patient Instructions (Addendum)
Medication Instructions:  Continue all current medications.  Labwork: none  Testing/Procedures: none  Follow-Up: 1 year   Any Other Special Instructions Will Be Listed Below (If Applicable).  If you need a refill on your cardiac medications before your next appointment, please call your pharmacy.  

## 2023-12-01 NOTE — Progress Notes (Signed)
   PCP: Veda Gerald, MD Primary Cardiologist: Dr Garlon Jupiter Primary EP: Dr Christine Cozier is a 78 y.o. male who presents today for routine electrophysiology followup.  Since last being seen in our clinic, the patient reports doing very well.  Today, he denies symptoms of palpitations, chest pain, shortness of breath,  lower extremity edema, dizziness, presyncope, syncope, or ICD shocks.  he has no device related complaints -- no new tenderness, drainage, redness.  The patient is otherwise without complaint today.     Physical Exam: Vitals:   12/01/23 0814  BP: (!) 152/58  Pulse: 62  SpO2: 97%  Weight: 183 lb 9.6 oz (83.3 kg)  Height: 5' 8.5" (1.74 m)     Gen: Appears comfortable, well-nourished CV: RRR, no dependent edema The device site is normal -- no tenderness, edema, drainage, redness, threatened erosion.  Pulm: breathing easily   ICD interrogation- reviewed in detail today,  See PACEART report      Wt Readings from Last 3 Encounters:  12/01/23 183 lb 9.6 oz (83.3 kg)  07/12/23 190 lb (86.2 kg)  06/14/23 190 lb (86.2 kg)    Assessment and Plan:  Chronic systolic dysfunction/ CAD/ ischemic CM s/p CABG 1998 euvolemic today Stable on an appropriate medical regimen Normal ICD function with good response to CRT See Pace Art report No changes today he is not device dependant today followed in ICM device clinic   Persistent atrial fibrillation Maintaining sinus rhythm with tikosyn  AF burden is 0 % - with the exception of one day, May 13 We discussed the importance of close folloow-up while on tikosyn  to avoid toxicity with this medicine -- he will be seen in AF clinic next week. ECG today, labs 11/27/23 reviewed Chads2vasc score is 4.  Continue coumadin  Labs 05/30/2023 reviewed   HTN Elevated today Home Bps show good control.   Risks, benefits and potential toxicities for medications prescribed and/or refilled reviewed with patient today.    Efraim Grange, MD  12/01/2023 8:36 AM

## 2023-12-06 ENCOUNTER — Ambulatory Visit (HOSPITAL_COMMUNITY)
Admission: RE | Admit: 2023-12-06 | Discharge: 2023-12-06 | Disposition: A | Discharge status: Home / Self Care | Payer: Medicare Other | Source: Ambulatory Visit | Attending: Physician Assistant | Admitting: Physician Assistant

## 2023-12-06 ENCOUNTER — Encounter (HOSPITAL_COMMUNITY): Payer: Self-pay | Admitting: Physician Assistant

## 2023-12-06 VITALS — BP 136/56 | HR 62 | Ht 68.5 in | Wt 181.4 lb

## 2023-12-06 DIAGNOSIS — D6869 Other thrombophilia: Secondary | ICD-10-CM | POA: Diagnosis not present

## 2023-12-06 DIAGNOSIS — Z5181 Encounter for therapeutic drug level monitoring: Secondary | ICD-10-CM | POA: Insufficient documentation

## 2023-12-06 DIAGNOSIS — Z79899 Other long term (current) drug therapy: Secondary | ICD-10-CM | POA: Insufficient documentation

## 2023-12-06 DIAGNOSIS — I4819 Other persistent atrial fibrillation: Secondary | ICD-10-CM | POA: Insufficient documentation

## 2023-12-06 NOTE — Progress Notes (Signed)
 Primary Care Physician: Dr Sims Duck Primary Cardiologist: Dr Garlon Jupiter Primary Electrophysiologist: Dr Arlester Ladd  Referring Physician: Mertha Abrahams PA   Brian Bruce is a 78 y.o. male with a history of CAD (CABG 1998), DM, persistent Afib, ICM s/p ICD, chronic CHF (systolic), LBBB who presents for follow up in the [facility]. Patient seen in EP clinic post ICD implant with persistent afib. He is on warfarin for a CHADS2VASC score of 4. Patient would like to pursue Tikosyn . He is fairly unaware of his arrhythmia with no increased SOB, fatigue, palpitations, or heart racing. He denies significant snoring or alcohol use. Patient is s/p dofetilide  loading 1/5-08/09/19. He converted to SR on the medication and did not require DCCV.   Patient returns for follow up for atrial fibrillation and dofetilide  monitoring. He reports that he has done well since his last visit. He denies any interim symptoms of afib. No bleeding issues on anticoagulation. He is very active and exercises for ~ 1 hour daily.   Today, he  denies symptoms of palpitations, chest pain, shortness of breath, orthopnea, PND, lower extremity edema, dizziness, presyncope, syncope, snoring, daytime somnolence, bleeding, or neurologic sequela. The patient is tolerating medications without difficulties and is otherwise without complaint today.    Atrial Fibrillation Risk Factors:  he does not have symptoms or diagnosis of sleep apnea. he does not have a history of rheumatic fever.   Atrial Fibrillation Management history:  Previous antiarrhythmic drugs: dofetilide  Previous cardioversions: none Previous ablations: none Anticoagulation history: warfarin    Past Medical History:  Diagnosis Date   AICD (automatic cardioverter/defibrillator) present    Arthritis    Asthma    CHF (congestive heart failure) (HCC)    Coronary artery disease    Diabetes mellitus without complication (HCC)    Hypertension     Ischemic cardiomyopathy    LBBB (left bundle branch block)    Persistent atrial fibrillation (HCC)     Current Outpatient Medications  Medication Sig Dispense Refill   amLODipine  (NORVASC ) 10 MG tablet Take 1 tablet by mouth daily.     amoxicillin (AMOXIL) 500 MG capsule Take 500 mg by mouth 4 (four) times daily.     atorvastatin  (LIPITOR ) 80 MG tablet Take 80 mg by mouth every evening.     augmented betamethasone dipropionate (DIPROLENE-AF) 0.05 % cream Apply 1 application topically 2 (two) times daily as needed (rash).     carvedilol  (COREG ) 25 MG tablet Take 1 tablet by mouth 2 (two) times daily.     Cholecalciferol  (VITAMIN D ) 50 MCG (2000 UT) tablet Take 1,000 Units by mouth daily.     Coenzyme Q10 (CO Q-10) 100 MG CAPS Take 100 mg by mouth daily.     diclofenac  Sodium (VOLTAREN ) 1 % GEL Apply 2 g topically 4 (four) times daily as needed (pain).     digoxin  (LANOXIN ) 0.125 MG tablet Take 1 tablet by mouth once daily 90 tablet 3   dofetilide  (TIKOSYN ) 500 MCG capsule Take 1 capsule by mouth twice daily 180 capsule 0   glimepiride  (AMARYL ) 2 MG tablet Take 1 tablet by mouth daily.     GLUCOSAMINE-CHONDROITIN PO Take 1 tablet by mouth 2 (two) times daily.     hydrALAZINE  (APRESOLINE ) 50 MG tablet Take 50 mg by mouth 3 (three) times daily.     ibuprofen (ADVIL) 800 MG tablet Take 800 mg by mouth every 8 (eight) hours as needed.     MAGnesium -Oxide 400 (240 Mg)  MG tablet Take 1 tablet (400 mg total) by mouth 2 (two) times daily. 180 tablet 1   metFORMIN  (GLUCOPHAGE ) 1000 MG tablet Take 1 tablet (1,000 mg total) by mouth 2 (two) times daily.     olmesartan (BENICAR) 40 MG tablet Take 40 mg by mouth daily.     olopatadine  (PATADAY ) 0.1 % ophthalmic solution Place 1 drop into both eyes daily.     Omega-3 Fatty Acids (FISH OIL) 1000 MG CAPS Take 1,000 mg by mouth 3 (three) times daily.     oxyCODONE -acetaminophen  (PERCOCET) 5-325 MG tablet Take 1-2 tablets by mouth every 6 (six) hours as  needed. 40 tablet 0   torsemide  (DEMADEX ) 20 MG tablet Take 1 tablet by mouth once daily 90 tablet 1   warfarin (COUMADIN ) 5 MG tablet Take 5-7.5 mg by mouth See admin instructions. Take 5 mg on Tues, Sat, and Sun Take 7.5 mg on Mon, Wed, Thurs, and Fri     No current facility-administered medications for this encounter.    ROS- All systems are reviewed and negative except as per the HPI above.  Physical Exam: Vitals:   12/06/23 0842  BP: (!) 136/56  Pulse: 62  Weight: 82.3 kg  Height: 5' 8.5" (1.74 m)    GEN: Well nourished, well developed in no acute distress NECK: No JVD; No carotid bruits CARDIAC: Regular rate and rhythm, no rubs, gallops, 2/6 systolic murmur  RESPIRATORY:  Clear to auscultation without rales, wheezing or rhonchi  ABDOMEN: Soft, non-tender, non-distended EXTREMITIES:  No edema; No deformity    Wt Readings from Last 3 Encounters:  12/06/23 82.3 kg  12/01/23 83.3 kg  07/12/23 86.2 kg    EKG today demonstrates AV dual paced rhythm Vent. rate 62 BPM PR interval 230 ms QRS duration 140 ms QT/QTcB 464/470 ms   Echo 12/27/19 demonstrated ([facility])  1. The left ventricle is normal in size with mildly increased wall  thickness.    2. The left ventricular systolic function is overall normal, LVEF is  visually estimated at 50-55%.    3. There is grade I diastolic dysfunction (impaired relaxation).    4. There is moderate aortic valve stenosis.    5. The left atrium is moderately dilated in size.    6. The right ventricle is normal in size, with normal systolic function.    7. The right atrium is moderately dilated  in size.    8. Overall left ventricular systolic function has normalized in comparison  to the study dated 03/2019.    Epic records are reviewed at length today   CHA2DS2-VASc Score = 6  The patient's score is based upon: CHF History: 1 HTN History: 1 Diabetes History: 1 Stroke History: 0 Vascular Disease History: 1 Age Score: 2 Gender  Score: 0       ASSESSMENT AND PLAN: Persistent Atrial Fibrillation (ICD10:  I48.19) The patient's CHA2DS2-VASc score is 6, indicating a 9.7% annual risk of stroke.   S/p dofetilide  loading 08/2019 Patient maintaining SR with 0% afib burden on device Continue dofetilide  500 mcg BID Continue carvedilol  25 mg BID Continue digoxin  0.125 mg daily Continue warfarin  Secondary Hypercoagulable State (ICD10:  D68.69) The patient is at significant risk for stroke/thromboembolism based upon his CHA2DS2-VASc Score of 6.  Continue Warfarin (Coumadin ). No bleeding issues.   High Risk Medication Monitoring (ICD 10: Z79.899) QT interval on ECG acceptable for dofetilide  monitoring. Check bmet/mag today.      CAD S/p CABG 1998 No anginal symptoms  HFrecEF EF 50-55%, ICM S/p ICD, followed by Dr Arlester Ladd GDMT per primary cardiology team Fluid status appears stable today  HTN Stable on current regimen   Follow up in the AF clinic in 6 months then Dr Arlester Ladd per recall.    Myrtha Ates PA-C [facility] [address] [place], [place] [address] [phone] 12/06/2023 8:53 AM

## 2023-12-07 LAB — BASIC METABOLIC PANEL WITH GFR
BUN/Creatinine Ratio: 13 (ref 10–24)
BUN: 11 mg/dL (ref 8–27)
CO2: 22 mmol/L (ref 20–29)
Calcium: 9.4 mg/dL (ref 8.6–10.2)
Chloride: 99 mmol/L (ref 96–106)
Creatinine, Ser: 0.84 mg/dL (ref 0.76–1.27)
Glucose: 117 mg/dL — ABNORMAL HIGH (ref 70–99)
Potassium: 4.4 mmol/L (ref 3.5–5.2)
Sodium: 138 mmol/L (ref 134–144)
eGFR: 90 mL/min/{1.73_m2} (ref >59)

## 2023-12-07 LAB — MAGNESIUM: Magnesium: 1.9 mg/dL (ref 1.6–2.3)

## 2023-12-13 ENCOUNTER — Other Ambulatory Visit: Payer: Self-pay | Admitting: Cardiovascular Disease

## 2023-12-18 ENCOUNTER — Ambulatory Visit: Attending: Cardiovascular Disease

## 2023-12-18 DIAGNOSIS — I5022 Chronic systolic (congestive) heart failure: Secondary | ICD-10-CM

## 2023-12-18 DIAGNOSIS — Z95810 Presence of automatic (implantable) cardiac defibrillator: Secondary | ICD-10-CM

## 2023-12-18 NOTE — Progress Notes (Signed)
 Remote ICD transmission.

## 2023-12-19 NOTE — Progress Notes (Signed)
 EPIC Encounter for ICM Monitoring  Patient Name: Brian Bruce is a 78 y.o. male Date: 12/19/2023 Primary Care Physican: Veda Gerald, MD Primary Cardiologist: [facility] Electrophysiologist: Mealor Bi-V Pacing: 98%            11/25/2022 Weight: 183 lbs 01/31/2023 Weight: 182 lbs  02/07/2023 Weight: 182 lbs 04/14/2023 Weight: 182-183 lbs  08/30/2023 Weight: 178 lb 10/04/2023 Weight: 178 lbs 12/19/2023 Weight: 178-179 lbs   AT/AF Burden 0% (taking Warfarin)                                                          Spoke with patient and heart failure questions reviewed.  Transmission results reviewed.  Pt asymptomatic for fluid accumulation.  Reports feeling well at this time and voices no complaints.     CorVue thoracic impedance suggesting normal fluid levels within the last month.   Prescribed: Torsemide  20 mg take 1 tablet daily.     Labs: 05/30/2023 Creatinine 0.98, BUN 20, Potassium 4.1, Sodium 133, GFR >60 05/19/2023 Creatinine 0.70, BUN 11, Potassium 4.0, Sodium 138, GFR >60 12/06/2022 Creatinine 0.72, BUN 9,   Potassium 4.1, Sodium 134, GFR >60 A complete set of results can be found in Results Review.   Recommendations:  No changes and encouraged to call if experiencing any fluid symptoms.   Follow-up plan: ICM clinic phone appointment on 02/19/2024.  91 day device clinic remote transmission 02/08/2024.     EP/Cardiology Office Visits:   Recall 11/25/2024 with Dr. Arlester Ladd.     Copy of ICM check sent to Dr. Arlester Ladd.    3 month ICM trend: 12/18/2023.    12-14 Month ICM trend:     [name], RN 12/19/2023 3:28 PM

## 2023-12-22 DIAGNOSIS — Z7901 Long term (current) use of anticoagulants: Secondary | ICD-10-CM | POA: Diagnosis not present

## 2023-12-26 DIAGNOSIS — H35031 Hypertensive retinopathy, right eye: Secondary | ICD-10-CM | POA: Diagnosis not present

## 2024-01-08 DIAGNOSIS — I10 Essential (primary) hypertension: Secondary | ICD-10-CM | POA: Diagnosis not present

## 2024-01-08 DIAGNOSIS — I361 Nonrheumatic tricuspid (valve) insufficiency: Secondary | ICD-10-CM | POA: Diagnosis not present

## 2024-01-08 DIAGNOSIS — I420 Dilated cardiomyopathy: Secondary | ICD-10-CM | POA: Diagnosis not present

## 2024-01-08 DIAGNOSIS — I255 Ischemic cardiomyopathy: Secondary | ICD-10-CM | POA: Diagnosis not present

## 2024-01-08 DIAGNOSIS — I272 Pulmonary hypertension, unspecified: Secondary | ICD-10-CM | POA: Diagnosis not present

## 2024-01-08 DIAGNOSIS — I2510 Atherosclerotic heart disease of native coronary artery without angina pectoris: Secondary | ICD-10-CM | POA: Diagnosis not present

## 2024-01-08 DIAGNOSIS — I5022 Chronic systolic (congestive) heart failure: Secondary | ICD-10-CM | POA: Diagnosis not present

## 2024-01-08 DIAGNOSIS — I340 Nonrheumatic mitral (valve) insufficiency: Secondary | ICD-10-CM | POA: Diagnosis not present

## 2024-01-08 DIAGNOSIS — I4811 Longstanding persistent atrial fibrillation: Secondary | ICD-10-CM | POA: Diagnosis not present

## 2024-01-22 ENCOUNTER — Other Ambulatory Visit: Payer: Self-pay | Admitting: Cardiovascular Disease

## 2024-01-26 DIAGNOSIS — Z7901 Long term (current) use of anticoagulants: Secondary | ICD-10-CM | POA: Diagnosis not present

## 2024-02-08 ENCOUNTER — Ambulatory Visit: Payer: Medicare Other

## 2024-02-08 DIAGNOSIS — I255 Ischemic cardiomyopathy: Secondary | ICD-10-CM | POA: Diagnosis not present

## 2024-02-09 DIAGNOSIS — Z7901 Long term (current) use of anticoagulants: Secondary | ICD-10-CM | POA: Diagnosis not present

## 2024-02-09 LAB — CUP PACEART REMOTE DEVICE CHECK
Battery Remaining Longevity: 30 mo
Battery Remaining Percentage: 37 %
Battery Voltage: 2.92 V
Brady Statistic AP VP Percent: 98 %
Brady Statistic AP VS Percent: 1.4 %
Brady Statistic AS VP Percent: 1 %
Brady Statistic AS VS Percent: 1 %
Brady Statistic RA Percent Paced: 99 %
Date Time Interrogation Session: 20250710024121
HighPow Impedance: 54 Ohm
Implantable Lead Connection Status: 753985
Implantable Lead Connection Status: 753985
Implantable Lead Connection Status: 753985
Implantable Lead Implant Date: 20201015
Implantable Lead Implant Date: 20201015
Implantable Lead Implant Date: 20201015
Implantable Lead Location: 753858
Implantable Lead Location: 753859
Implantable Lead Location: 753860
Implantable Pulse Generator Implant Date: 20201015
Lead Channel Impedance Value: 360 Ohm
Lead Channel Impedance Value: 390 Ohm
Lead Channel Impedance Value: 440 Ohm
Lead Channel Pacing Threshold Amplitude: 0.625 V
Lead Channel Pacing Threshold Amplitude: 1 V
Lead Channel Pacing Threshold Amplitude: 1.625 V
Lead Channel Pacing Threshold Pulse Width: 0.5 ms
Lead Channel Pacing Threshold Pulse Width: 0.5 ms
Lead Channel Pacing Threshold Pulse Width: 0.8 ms
Lead Channel Sensing Intrinsic Amplitude: 1.3 mV
Lead Channel Sensing Intrinsic Amplitude: 12 mV
Lead Channel Setting Pacing Amplitude: 1.625
Lead Channel Setting Pacing Amplitude: 2 V
Lead Channel Setting Pacing Amplitude: 2.125
Lead Channel Setting Pacing Pulse Width: 0.5 ms
Lead Channel Setting Pacing Pulse Width: 0.8 ms
Lead Channel Setting Sensing Sensitivity: 0.5 mV
Pulse Gen Serial Number: 111006847
Zone Setting Status: 755011

## 2024-02-12 ENCOUNTER — Ambulatory Visit: Payer: Self-pay | Admitting: Cardiovascular Disease

## 2024-02-19 ENCOUNTER — Ambulatory Visit: Attending: Cardiovascular Disease

## 2024-02-19 DIAGNOSIS — I5022 Chronic systolic (congestive) heart failure: Secondary | ICD-10-CM | POA: Diagnosis not present

## 2024-02-19 DIAGNOSIS — Z95810 Presence of automatic (implantable) cardiac defibrillator: Secondary | ICD-10-CM

## 2024-02-21 NOTE — Progress Notes (Signed)
 EPIC Encounter for ICM Monitoring  Patient Name: Brian Bruce is a 78 y.o. male Date: 02/21/2024 Primary Care Physican: Orpha Yancey LABOR, MD Primary Cardiologist: [facility] Electrophysiologist: Mealor Bi-V Pacing: 98%            11/25/2022 Weight: 183 lbs 01/31/2023 Weight: 182 lbs  02/07/2023 Weight: 182 lbs 04/14/2023 Weight: 182-183 lbs  08/30/2023 Weight: 178 lb 10/04/2023 Weight: 178 lbs 12/19/2023 Weight: 178-179 lbs 02/21/2024 Weight: 180 lbs   AT/AF Burden 0% (taking Warfarin)                                                          Spoke with patient and heart failure questions reviewed.  Transmission results reviewed.  Pt asymptomatic for fluid accumulation.  Reports feeling well at this time and voices no complaints.     CorVue thoracic impedance suggesting normal fluid levels within the last month.   Prescribed: Torsemide  20 mg take 1 tablet daily.     Labs: 12/06/2023 Creatinine 0.84, BUN 11 Potassium 4.4, Sodium 138, GFR 90 A complete set of results can be found in Results Review.   Recommendations:  No changes and encouraged to call if experiencing any fluid symptoms.   Follow-up plan: ICM clinic phone appointment on 03/25/2024.  91 day device clinic remote transmission 05/09/2024.     EP/Cardiology Office Visits:   Recall 11/25/2024 with Dr. Nancey.     Copy of ICM check sent to Dr. Nancey.    3 month ICM trend: 02/19/2024.    12-14 Month ICM trend:     [name], RN 02/21/2024 4:03 PM

## 2024-02-28 DIAGNOSIS — E7849 Other hyperlipidemia: Secondary | ICD-10-CM | POA: Diagnosis not present

## 2024-02-28 DIAGNOSIS — M1712 Unilateral primary osteoarthritis, left knee: Secondary | ICD-10-CM | POA: Diagnosis not present

## 2024-02-28 DIAGNOSIS — I5022 Chronic systolic (congestive) heart failure: Secondary | ICD-10-CM | POA: Diagnosis not present

## 2024-02-28 DIAGNOSIS — I10 Essential (primary) hypertension: Secondary | ICD-10-CM | POA: Diagnosis not present

## 2024-02-28 DIAGNOSIS — Z7901 Long term (current) use of anticoagulants: Secondary | ICD-10-CM | POA: Diagnosis not present

## 2024-02-28 DIAGNOSIS — E1159 Type 2 diabetes mellitus with other circulatory complications: Secondary | ICD-10-CM | POA: Diagnosis not present

## 2024-03-22 DIAGNOSIS — Z7901 Long term (current) use of anticoagulants: Secondary | ICD-10-CM | POA: Diagnosis not present

## 2024-03-25 ENCOUNTER — Ambulatory Visit: Attending: Cardiovascular Disease

## 2024-03-25 DIAGNOSIS — I5022 Chronic systolic (congestive) heart failure: Secondary | ICD-10-CM

## 2024-03-25 DIAGNOSIS — Z95810 Presence of automatic (implantable) cardiac defibrillator: Secondary | ICD-10-CM

## 2024-03-27 NOTE — Progress Notes (Signed)
 EPIC Encounter for ICM Monitoring  Patient Name: Brian Bruce is a 78 y.o. male Date: 03/27/2024 Primary Care Physican: Orpha Yancey LABOR, MD Primary Cardiologist: [facility] Electrophysiologist: Mealor Bi-V Pacing: 98%            11/25/2022 Weight: 183 lbs 01/31/2023 Weight: 182 lbs  02/07/2023 Weight: 182 lbs 04/14/2023 Weight: 182-183 lbs  08/30/2023 Weight: 178 lb 10/04/2023 Weight: 178 lbs 12/19/2023 Weight: 178-179 lbs 02/21/2024 Weight: 180 lbs   AT/AF Burden 0% (taking Warfarin)                                                          Spoke with patient and heart failure questions reviewed.  Transmission results reviewed.  Pt asymptomatic for fluid accumulation.  Reports feeling well at this time and voices no complaints.  He is building his son a large windmill.   CorVue thoracic impedance suggesting normal fluid levels within the last month.   Prescribed: Torsemide  20 mg take 1 tablet daily.     Labs: 12/06/2023 Creatinine 0.84, BUN 11 Potassium 4.4, Sodium 138, GFR 90 A complete set of results can be found in Results Review.   Recommendations:  No changes and encouraged to call if experiencing any fluid symptoms.   Follow-up plan: ICM clinic phone appointment on 04/29/2024.  91 day device clinic remote transmission 05/09/2024.     EP/Cardiology Office Visits:   Recall 11/25/2024 with Dr. Nancey.     Copy of ICM check sent to Dr. Nancey.    3 month ICM trend: 03/25/2024.    12-14 Month ICM trend:     [name], RN 03/27/2024 5:11 PM

## 2024-04-11 DIAGNOSIS — H02845 Edema of left lower eyelid: Secondary | ICD-10-CM | POA: Diagnosis not present

## 2024-04-19 DIAGNOSIS — H11442 Conjunctival cysts, left eye: Secondary | ICD-10-CM | POA: Diagnosis not present

## 2024-04-19 DIAGNOSIS — Z7901 Long term (current) use of anticoagulants: Secondary | ICD-10-CM | POA: Diagnosis not present

## 2024-04-24 DIAGNOSIS — E1159 Type 2 diabetes mellitus with other circulatory complications: Secondary | ICD-10-CM | POA: Diagnosis not present

## 2024-04-24 DIAGNOSIS — I10 Essential (primary) hypertension: Secondary | ICD-10-CM | POA: Diagnosis not present

## 2024-04-28 ENCOUNTER — Other Ambulatory Visit: Payer: Self-pay | Admitting: Physician Assistant

## 2024-04-29 ENCOUNTER — Ambulatory Visit: Attending: Cardiovascular Disease

## 2024-04-29 DIAGNOSIS — Z95810 Presence of automatic (implantable) cardiac defibrillator: Secondary | ICD-10-CM

## 2024-04-29 DIAGNOSIS — I5022 Chronic systolic (congestive) heart failure: Secondary | ICD-10-CM | POA: Diagnosis not present

## 2024-05-01 MED ORDER — TORSEMIDE 20 MG PO TABS: 20.0000 mg | ORAL_TABLET | Freq: Every day | ORAL | 0 refills | Status: DC

## 2024-05-01 NOTE — Progress Notes (Signed)
 EPIC Encounter for ICM Monitoring  Patient Name: Brian Bruce is a 78 y.o. male Date: 05/01/2024 Primary Care Physican: Orpha Yancey LABOR, MD Primary Cardiologist: [facility] Electrophysiologist: Mealor Bi-V Pacing: 98%            11/25/2022 Weight: 183 lbs 01/31/2023 Weight: 182 lbs  02/07/2023 Weight: 182 lbs 04/14/2023 Weight: 182-183 lbs  08/30/2023 Weight: 178 lb 10/04/2023 Weight: 178 lbs 12/19/2023 Weight: 178-179 lbs 02/21/2024 Weight: 180 lbs 05/01/2024 Weight: 180 lbs   AT/AF Burden 0% (taking Warfarin)                                                          Spoke with patient and heart failure questions reviewed.  Transmission results reviewed.  Pt asymptomatic for fluid accumulation.  Reports feeling well at this time and voices no complaints.   He is building his son a large windmill.   Since 03/25/2024 ICM Remote Transmission:  CorVue thoracic impedance suggesting normal fluid levels with the exception of possible fluid accumulation from 04/13/2024-04/19/2024.   Prescribed: Torsemide  20 mg take 1 tablet daily.     Labs: 12/06/2023 Creatinine 0.84, BUN 11 Potassium 4.4, Sodium 138, GFR 90 A complete set of results can be found in Results Review.   Recommendations:  No changes and encouraged to call if experiencing any fluid symptoms.   Follow-up plan: ICM clinic phone appointment on 06/03/2024.  91 day device clinic remote transmission 05/09/2024.     EP/Cardiology Office Visits:   Recall 11/25/2024 with Dr. Nancey.     Copy of ICM check sent to Dr. Nancey.    Remote monitoring is medically necessary for Heart Failure Management.    90 day Daily Thoracic Impedance ICM trend: 01/29/2024 through 04/29/2024.    12-14 Month Thoracic Impedance ICM trend:     Brian GORMAN Garner, RN 05/01/2024 9:56 AM

## 2024-05-01 NOTE — Addendum Note (Signed)
 Addended by: FRANCHOT GLADE RAMAN on: 05/01/2024 09:33 AM   Modules accepted: Orders

## 2024-05-09 ENCOUNTER — Ambulatory Visit (INDEPENDENT_AMBULATORY_CARE_PROVIDER_SITE_OTHER): Payer: Medicare Other

## 2024-05-09 DIAGNOSIS — I5022 Chronic systolic (congestive) heart failure: Secondary | ICD-10-CM

## 2024-05-09 LAB — CUP PACEART REMOTE DEVICE CHECK
Battery Remaining Longevity: 29 mo
Battery Remaining Percentage: 36 %
Battery Voltage: 2.9 V
Brady Statistic AP VP Percent: 98 %
Brady Statistic AP VS Percent: 1.4 %
Brady Statistic AS VP Percent: 1 %
Brady Statistic AS VS Percent: 1 %
Brady Statistic RA Percent Paced: 99 %
Date Time Interrogation Session: 20251009024259
HighPow Impedance: 52 Ohm
Implantable Lead Connection Status: 753985
Implantable Lead Connection Status: 753985
Implantable Lead Connection Status: 753985
Implantable Lead Implant Date: 20201015
Implantable Lead Implant Date: 20201015
Implantable Lead Implant Date: 20201015
Implantable Lead Location: 753858
Implantable Lead Location: 753859
Implantable Lead Location: 753860
Implantable Pulse Generator Implant Date: 20201015
Lead Channel Impedance Value: 360 Ohm
Lead Channel Impedance Value: 390 Ohm
Lead Channel Impedance Value: 480 Ohm
Lead Channel Pacing Threshold Amplitude: 0.625 V
Lead Channel Pacing Threshold Amplitude: 1 V
Lead Channel Pacing Threshold Amplitude: 1.625 V
Lead Channel Pacing Threshold Pulse Width: 0.5 ms
Lead Channel Pacing Threshold Pulse Width: 0.5 ms
Lead Channel Pacing Threshold Pulse Width: 0.8 ms
Lead Channel Sensing Intrinsic Amplitude: 1.3 mV
Lead Channel Sensing Intrinsic Amplitude: 12 mV
Lead Channel Setting Pacing Amplitude: 1.625
Lead Channel Setting Pacing Amplitude: 2 V
Lead Channel Setting Pacing Amplitude: 2.125
Lead Channel Setting Pacing Pulse Width: 0.5 ms
Lead Channel Setting Pacing Pulse Width: 0.8 ms
Lead Channel Setting Sensing Sensitivity: 0.5 mV
Pulse Gen Serial Number: 111006847
Zone Setting Status: 755011

## 2024-05-09 NOTE — Progress Notes (Signed)
 Remote ICD Transmission

## 2024-05-14 DIAGNOSIS — H11442 Conjunctival cysts, left eye: Secondary | ICD-10-CM | POA: Diagnosis not present

## 2024-05-14 NOTE — Progress Notes (Signed)
 Remote ICD Transmission

## 2024-05-16 ENCOUNTER — Ambulatory Visit: Payer: Self-pay | Admitting: Cardiovascular Disease

## 2024-05-24 DIAGNOSIS — Z7901 Long term (current) use of anticoagulants: Secondary | ICD-10-CM | POA: Diagnosis not present

## 2024-05-27 ENCOUNTER — Other Ambulatory Visit: Payer: Self-pay | Admitting: Physician Assistant

## 2024-05-27 DIAGNOSIS — E1159 Type 2 diabetes mellitus with other circulatory complications: Secondary | ICD-10-CM | POA: Diagnosis not present

## 2024-05-27 DIAGNOSIS — I10 Essential (primary) hypertension: Secondary | ICD-10-CM | POA: Diagnosis not present

## 2024-06-03 ENCOUNTER — Ambulatory Visit: Attending: Cardiovascular Disease

## 2024-06-03 DIAGNOSIS — I5022 Chronic systolic (congestive) heart failure: Secondary | ICD-10-CM

## 2024-06-03 DIAGNOSIS — Z95810 Presence of automatic (implantable) cardiac defibrillator: Secondary | ICD-10-CM

## 2024-06-05 ENCOUNTER — Ambulatory Visit (HOSPITAL_COMMUNITY)
Admission: RE | Admit: 2024-06-05 | Discharge: 2024-06-05 | Disposition: A | Discharge status: Home / Self Care | Source: Ambulatory Visit | Attending: Physician Assistant | Admitting: Physician Assistant

## 2024-06-05 VITALS — BP 158/58 | HR 63 | Ht 68.5 in | Wt 182.0 lb

## 2024-06-05 DIAGNOSIS — I4891 Unspecified atrial fibrillation: Secondary | ICD-10-CM | POA: Diagnosis not present

## 2024-06-05 DIAGNOSIS — Z96652 Presence of left artificial knee joint: Secondary | ICD-10-CM | POA: Insufficient documentation

## 2024-06-05 DIAGNOSIS — I519 Heart disease, unspecified: Secondary | ICD-10-CM | POA: Diagnosis not present

## 2024-06-05 DIAGNOSIS — I5022 Chronic systolic (congestive) heart failure: Secondary | ICD-10-CM | POA: Insufficient documentation

## 2024-06-05 DIAGNOSIS — I4819 Other persistent atrial fibrillation: Secondary | ICD-10-CM | POA: Insufficient documentation

## 2024-06-05 DIAGNOSIS — Z5181 Encounter for therapeutic drug level monitoring: Secondary | ICD-10-CM | POA: Diagnosis not present

## 2024-06-05 DIAGNOSIS — D6869 Other thrombophilia: Secondary | ICD-10-CM | POA: Insufficient documentation

## 2024-06-05 DIAGNOSIS — Z79899 Other long term (current) drug therapy: Secondary | ICD-10-CM | POA: Insufficient documentation

## 2024-06-05 NOTE — Progress Notes (Signed)
 Primary Care Physician: Dr Yancey [place] Primary Cardiologist: Dr Gearld Primary Electrophysiologist: Dr Nancey  Referring Physician: Charlies Arthur PA   Brian Bruce is a 78 y.o. male with a history of CAD (CABG 1998), DM, persistent Afib, ICM s/p ICD, chronic CHF (systolic), LBBB who presents for follow up in the [facility]. Patient seen in EP clinic post ICD implant with persistent afib. He is on warfarin for a CHADS2VASC score of 4. Patient would like to pursue Tikosyn . He is fairly unaware of his arrhythmia with no increased SOB, fatigue, palpitations, or heart racing. He denies significant snoring or alcohol use. Patient is s/p dofetilide  loading 1/5-08/09/19. He converted to SR on the medication and did not require DCCV.   Patient returns for follow up for atrial fibrillation and dofetilide  monitoring. He is in SR today and feels well. His ICD shows 0% afib burden. No bleeding issues on anticoagulation.   Today, he  denies symptoms of palpitations, chest pain, shortness of breath, orthopnea, PND, lower extremity edema, dizziness, presyncope, syncope, snoring, daytime somnolence, bleeding, or neurologic sequela. The patient is tolerating medications without difficulties and is otherwise without complaint today.    Atrial Fibrillation Risk Factors:  he does not have symptoms or diagnosis of sleep apnea. he does not have a history of rheumatic fever.   Atrial Fibrillation Management history:  Previous antiarrhythmic drugs: dofetilide  Previous cardioversions: none Previous ablations: none Anticoagulation history: warfarin    Past Medical History:  Diagnosis Date   AICD (automatic cardioverter/defibrillator) present    Arthritis    Asthma    CHF (congestive heart failure) (HCC)    Coronary artery disease    Diabetes mellitus without complication (HCC)    Hypertension    Ischemic cardiomyopathy    LBBB (left bundle branch block)    Persistent  atrial fibrillation (HCC)     Current Outpatient Medications  Medication Sig Dispense Refill   amLODipine  (NORVASC ) 10 MG tablet Take 1 tablet by mouth daily.     atorvastatin  (LIPITOR ) 80 MG tablet Take 80 mg by mouth every evening.     augmented betamethasone dipropionate (DIPROLENE-AF) 0.05 % cream Apply 1 application topically 2 (two) times daily as needed (rash).     carvedilol  (COREG ) 25 MG tablet Take 1 tablet by mouth 2 (two) times daily.     Cholecalciferol  (VITAMIN D ) 50 MCG (2000 UT) tablet Take 1,000 Units by mouth daily.     Coenzyme Q10 (CO Q-10) 100 MG CAPS Take 100 mg by mouth daily.     diclofenac  Sodium (VOLTAREN ) 1 % GEL Apply 2 g topically 4 (four) times daily as needed (pain).     digoxin  (LANOXIN ) 0.125 MG tablet Take 1 tablet by mouth once daily 90 tablet 3   dofetilide  (TIKOSYN ) 500 MCG capsule Take 1 capsule by mouth twice daily 180 capsule 2   glimepiride  (AMARYL ) 2 MG tablet Take 1 tablet by mouth daily.     GLUCOSAMINE-CHONDROITIN PO Take 1 tablet by mouth 2 (two) times daily.     hydrALAZINE  (APRESOLINE ) 50 MG tablet Take 50 mg by mouth 3 (three) times daily.     ibuprofen (ADVIL) 800 MG tablet Take 800 mg by mouth every 8 (eight) hours as needed. (Patient taking differently: Take 800 mg by mouth as needed.)     MAGnesium -Oxide 400 (240 Mg) MG tablet Take 1 tablet by mouth twice daily 180 tablet 3   metFORMIN  (GLUCOPHAGE ) 1000 MG tablet Take 1 tablet (1,000  mg total) by mouth 2 (two) times daily.     olmesartan (BENICAR) 40 MG tablet Take 40 mg by mouth daily.     olopatadine  (PATADAY ) 0.1 % ophthalmic solution Place 1 drop into both eyes daily.     Omega-3 Fatty Acids (FISH OIL) 1000 MG CAPS Take 1,000 mg by mouth 3 (three) times daily.     oxyCODONE -acetaminophen  (PERCOCET) 5-325 MG tablet Take 1-2 tablets by mouth every 6 (six) hours as needed. 40 tablet 0   torsemide  (DEMADEX ) 20 MG tablet Take 1 tablet by mouth once daily 30 tablet 0   warfarin (COUMADIN ) 5  MG tablet Take 5-7.5 mg by mouth See admin instructions. Take 5 mg on Tues, Sat, and Sun Take 7.5 mg on Mon, Wed, Thurs, and Fri (Patient taking differently: Take 5-7.5 mg by mouth See admin instructions. Take 5 mg on Wednesday and friday Take 7.5 mg on Mon, Tues, Thursday, Saturday and Sunday)     amoxicillin (AMOXIL) 500 MG capsule Take 500 mg by mouth 4 (four) times daily. (Patient not taking: Reported on 06/05/2024)     No current facility-administered medications for this encounter.    ROS- All systems are reviewed and negative except as per the HPI above.  Physical Exam: Vitals:   06/05/24 0844  BP: (!) 158/58  Pulse: 63  Weight: 82.6 kg  Height: 5' 8.5 (1.74 m)    GEN: Well nourished, well developed in no acute distress CARDIAC: Regular rate and rhythm, no rubs, gallops, 2/6 systolic murmur  RESPIRATORY:  Clear to auscultation without rales, wheezing or rhonchi  ABDOMEN: Soft, non-tender, non-distended EXTREMITIES:  No edema; No deformity    Wt Readings from Last 3 Encounters:  06/05/24 82.6 kg  12/06/23 82.3 kg  12/01/23 83.3 kg    EKG today demonstrates AV dual paced rhythm Vent. rate 63 BPM PR interval 220 ms QRS duration 158 ms QT/QTcB 502/513 ms   Echo 10/13/23 demonstrated [facility]) Summary 1. The left ventricle is mildly dilated in size with mildly increased wall thickness. 2. The left ventricular systolic function is normal, LVEF is visually estimated at 55-60%. 3. There is mild to moderate aortic valve stenosis. 4. The left atrium is moderately dilated in size. 5. The right ventricle is moderately dilated in size, with normal systolic function. 6. The right atrium is severely dilated in size. 7. IVC size and inspiratory change suggest elevated right atrial pressure. (10-20 mmHg).   Epic records are reviewed at length today   CHA2DS2-VASc Score = 6  The patient's score is based upon: CHF History: 1 HTN History: 1 Diabetes History: 1 Stroke History:  0 Vascular Disease History: 1 Age Score: 2 Gender Score: 0       ASSESSMENT AND PLAN: Persistent Atrial Fibrillation (ICD10:  I48.19) The patient's CHA2DS2-VASc score is 6, indicating a 9.7% annual risk of stroke.   S/p dofetilide  loading 08/2019 Device interrogation 05/09/24 showed 0% afib burden. Continue dofetilide  500 mcg BID Continue carvedilol  25 mg BID Continue digoxin  0.125 mg daily Continue warfarin  Secondary Hypercoagulable State (ICD10:  D68.69) The patient is at significant risk for stroke/thromboembolism based upon his CHA2DS2-VASc Score of 6.  Continue Warfarin (Coumadin ). No bleeding issues.   High Risk Medication Monitoring (ICD 10: [id number]) Patient requires ongoing monitoring for anti-arrhythmic medication which has the potential to cause life threatening arrhythmias. QT interval on ECG acceptable for dofetilide  monitoring after correcting for V pacing (475 ms). Check bmet/mag today.    CAD S/p CABG 1998 No  anginal symptoms Followed by Dr Gearld [place]  HFrecEF EF 55-60% S/p ICD, followed by Dr Nancey GDMT per primary cardiology team Fluid status appears stable today  HTN Mildly elevated today, home BP readings within normal range.  Continue to monitor.    Follow up with Dr Nancey per recall. AF clinic in one year.    Daril Kicks PA-C Afib Clinic [facility] [address] [place], [place] [address] [phone] 06/05/2024 9:38 AM

## 2024-06-06 ENCOUNTER — Ambulatory Visit (HOSPITAL_COMMUNITY): Payer: Self-pay | Admitting: Physician Assistant

## 2024-06-06 LAB — BASIC METABOLIC PANEL WITH GFR
BUN/Creatinine Ratio: 17 (ref 10–24)
BUN: 14 mg/dL (ref 8–27)
CO2: 22 mmol/L (ref 20–29)
Calcium: 9.2 mg/dL (ref 8.6–10.2)
Chloride: 99 mmol/L (ref 96–106)
Creatinine, Ser: 0.81 mg/dL (ref 0.76–1.27)
Glucose: 161 mg/dL — ABNORMAL HIGH (ref 70–99)
Potassium: 4.5 mmol/L (ref 3.5–5.2)
Sodium: 137 mmol/L (ref 134–144)
eGFR: 91 mL/min/1.73 (ref >59)

## 2024-06-06 LAB — MAGNESIUM: Magnesium: 2 mg/dL (ref 1.6–2.3)

## 2024-06-07 ENCOUNTER — Telehealth: Payer: Self-pay

## 2024-06-07 NOTE — Telephone Encounter (Signed)
 Remote ICM transmission received.  Attempted call to patient regarding ICM remote transmission.  Left detailed message per DPR with ICM phone number to return call for any questions, concerns or fluid symptoms.

## 2024-06-07 NOTE — Progress Notes (Signed)
 EPIC Encounter for ICM Monitoring  Patient Name: Brian Bruce is a 78 y.o. male Date: 06/07/2024 Primary Care Physican: Orpha Yancey LABOR, MD Primary Cardiologist: [facility] Cardiology Electrophysiologist: Mealor Bi-V Pacing: 98%            04/14/2023 Weight: 182-183 lbs  08/30/2023 Weight: 178 lb 10/04/2023 Weight: 178 lbs 12/19/2023 Weight: 178-179 lbs 02/21/2024 Weight: 180 lbs 05/01/2024 Weight: 180 lbs   AT/AF Burden 0% (taking Warfarin)                                                          Attempted call to patient and unable to reach.  Left detailed message per DPR regarding transmission.  Transmission results reviewed.    Since 04/29/2024 ICM Remote Transmission:  CorVue thoracic impedance suggesting normal fluid levels.   Prescribed: Torsemide  20 mg take 1 tablet daily.     Labs: 06/05/2024 Creatinine 0.81, BUN 17, Potassium 4.5, Sodium 137, GFR 91 12/06/2023 Creatinine 0.84, BUN 11 Potassium 4.4, Sodium 138, GFR 90 A complete set of results can be found in Results Review.   Recommendations:  Left voice mail with ICM number and encouraged to call if experiencing any fluid symptoms.   Follow-up plan: ICM clinic phone appointment on 07/15/2024.  91 day device clinic remote transmission 08/08/2024.     EP/Cardiology Office Visits:   Recall 11/25/2024 with Dr. Nancey.     Copy of ICM check sent to Dr. Nancey.    Remote monitoring is medically necessary for Heart Failure Management.    Daily Thoracic Impedance ICM trend: 03/05/2024 through 06/03/2024.    12-14 Month Thoracic Impedance ICM trend:     [name], RN 06/07/2024 8:22 AM

## 2024-06-07 NOTE — Telephone Encounter (Signed)
 Error, duplicate

## 2024-06-11 DIAGNOSIS — E7849 Other hyperlipidemia: Secondary | ICD-10-CM | POA: Diagnosis not present

## 2024-06-11 DIAGNOSIS — I5022 Chronic systolic (congestive) heart failure: Secondary | ICD-10-CM | POA: Diagnosis not present

## 2024-06-11 DIAGNOSIS — I10 Essential (primary) hypertension: Secondary | ICD-10-CM | POA: Diagnosis not present

## 2024-06-11 DIAGNOSIS — E1159 Type 2 diabetes mellitus with other circulatory complications: Secondary | ICD-10-CM | POA: Diagnosis not present

## 2024-06-11 DIAGNOSIS — Z125 Encounter for screening for malignant neoplasm of prostate: Secondary | ICD-10-CM | POA: Diagnosis not present

## 2024-06-11 DIAGNOSIS — M1712 Unilateral primary osteoarthritis, left knee: Secondary | ICD-10-CM | POA: Diagnosis not present

## 2024-06-22 ENCOUNTER — Other Ambulatory Visit: Payer: Self-pay | Admitting: Physician Assistant

## 2024-06-25 DIAGNOSIS — E1159 Type 2 diabetes mellitus with other circulatory complications: Secondary | ICD-10-CM | POA: Diagnosis not present

## 2024-06-25 DIAGNOSIS — I10 Essential (primary) hypertension: Secondary | ICD-10-CM | POA: Diagnosis not present

## 2024-07-01 DIAGNOSIS — H353121 Nonexudative age-related macular degeneration, left eye, early dry stage: Secondary | ICD-10-CM | POA: Diagnosis not present

## 2024-07-10 DIAGNOSIS — E782 Mixed hyperlipidemia: Secondary | ICD-10-CM | POA: Diagnosis not present

## 2024-07-10 DIAGNOSIS — I350 Nonrheumatic aortic (valve) stenosis: Secondary | ICD-10-CM | POA: Diagnosis not present

## 2024-07-10 DIAGNOSIS — I480 Paroxysmal atrial fibrillation: Secondary | ICD-10-CM | POA: Diagnosis not present

## 2024-07-10 DIAGNOSIS — I10 Essential (primary) hypertension: Secondary | ICD-10-CM | POA: Diagnosis not present

## 2024-07-10 DIAGNOSIS — Z951 Presence of aortocoronary bypass graft: Secondary | ICD-10-CM | POA: Diagnosis not present

## 2024-07-10 DIAGNOSIS — I2510 Atherosclerotic heart disease of native coronary artery without angina pectoris: Secondary | ICD-10-CM | POA: Diagnosis not present

## 2024-07-10 DIAGNOSIS — I5022 Chronic systolic (congestive) heart failure: Secondary | ICD-10-CM | POA: Diagnosis not present

## 2024-07-15 ENCOUNTER — Ambulatory Visit: Attending: Cardiovascular Disease

## 2024-07-15 DIAGNOSIS — Z95810 Presence of automatic (implantable) cardiac defibrillator: Secondary | ICD-10-CM | POA: Diagnosis not present

## 2024-07-15 DIAGNOSIS — I5022 Chronic systolic (congestive) heart failure: Secondary | ICD-10-CM | POA: Diagnosis not present

## 2024-07-17 ENCOUNTER — Telehealth: Payer: Self-pay

## 2024-07-17 NOTE — Progress Notes (Signed)
 EPIC Encounter for ICM Monitoring  Patient Name: Brian Bruce is a 78 y.o. male Date: 07/17/2024 Primary Care Physican: Orpha Yancey LABOR, MD Primary Cardiologist: [facility] Cardiology Electrophysiologist: Mealor Bi-V Pacing: 98%            04/14/2023 Weight: 182-183 lbs  08/30/2023 Weight: 178 lb 10/04/2023 Weight: 178 lbs 12/19/2023 Weight: 178-179 lbs 02/21/2024 Weight: 180 lbs 05/01/2024 Weight: 180 lbs 06/05/2024 Office Weight: 182 lbs   AT/AF Burden 0% (taking Warfarin)                                                          Attempted call to patient and unable to reach.  Left detailed message per DPR regarding transmission.  Transmission results reviewed.    Since 06/03/2024 ICM Remote Transmission:  CorVue thoracic impedance suggesting normal fluid levels with the exception of possible fluid accumulation starting 07/11/2024 and returned to baseline 07/15/2024.  Also suggesting possible fluid accumulation from 06/16/2024-06/23/2024 and 07/01/2024-07/04/2024.    Prescribed: Torsemide  20 mg take 1 tablet daily.     Labs: 06/05/2024 Creatinine 0.81, BUN 17, Potassium 4.5, Sodium 137, GFR 91 12/06/2023 Creatinine 0.84, BUN 11 Potassium 4.4, Sodium 138, GFR 90 A complete set of results can be found in Results Review.   Recommendations:  Left voice mail with ICM number and encouraged to call if experiencing any fluid symptoms.   Follow-up plan: ICM clinic phone appointment on 08/19/2024.  91 day device clinic remote transmission 08/08/2024.     EP/Cardiology Office Visits:   Recall 11/25/2024 with Dr. Nancey.     Copy of ICM check sent to Dr. Nancey.     Remote monitoring is medically necessary for Heart Failure Management.    Daily Thoracic Impedance ICM trend: 04/16/2024 through 07/15/2024.    12-14 Month Thoracic Impedance ICM trend:     [name], RN 07/17/2024 10:39 AM

## 2024-07-17 NOTE — Telephone Encounter (Signed)
 Remote ICM transmission received.  Attempted call to patient regarding ICM remote transmission and no answer.

## 2024-07-20 ENCOUNTER — Other Ambulatory Visit: Payer: Self-pay | Admitting: Cardiovascular Disease

## 2024-08-08 ENCOUNTER — Ambulatory Visit

## 2024-08-08 DIAGNOSIS — I5022 Chronic systolic (congestive) heart failure: Secondary | ICD-10-CM | POA: Diagnosis not present

## 2024-08-09 LAB — CUP PACEART REMOTE DEVICE CHECK
Battery Remaining Longevity: 28 mo
Battery Remaining Percentage: 34 %
Battery Voltage: 2.9 V
Brady Statistic AP VP Percent: 98 %
Brady Statistic AP VS Percent: 1.4 %
Brady Statistic AS VP Percent: 1 %
Brady Statistic AS VS Percent: 1 %
Brady Statistic RA Percent Paced: 99 %
Date Time Interrogation Session: 20260108020200
HighPow Impedance: 50 Ohm
Implantable Lead Connection Status: 753985
Implantable Lead Connection Status: 753985
Implantable Lead Connection Status: 753985
Implantable Lead Implant Date: 20201015
Implantable Lead Implant Date: 20201015
Implantable Lead Implant Date: 20201015
Implantable Lead Location: 753858
Implantable Lead Location: 753859
Implantable Lead Location: 753860
Implantable Pulse Generator Implant Date: 20201015
Lead Channel Impedance Value: 360 Ohm
Lead Channel Impedance Value: 400 Ohm
Lead Channel Impedance Value: 410 Ohm
Lead Channel Pacing Threshold Amplitude: 0.625 V
Lead Channel Pacing Threshold Amplitude: 1 V
Lead Channel Pacing Threshold Amplitude: 1.625 V
Lead Channel Pacing Threshold Pulse Width: 0.5 ms
Lead Channel Pacing Threshold Pulse Width: 0.5 ms
Lead Channel Pacing Threshold Pulse Width: 0.8 ms
Lead Channel Sensing Intrinsic Amplitude: 1.5 mV
Lead Channel Sensing Intrinsic Amplitude: 12 mV
Lead Channel Setting Pacing Amplitude: 1.625
Lead Channel Setting Pacing Amplitude: 2 V
Lead Channel Setting Pacing Amplitude: 2.125
Lead Channel Setting Pacing Pulse Width: 0.5 ms
Lead Channel Setting Pacing Pulse Width: 0.8 ms
Lead Channel Setting Sensing Sensitivity: 0.5 mV
Pulse Gen Serial Number: 111006847
Zone Setting Status: 755011

## 2024-08-10 ENCOUNTER — Ambulatory Visit: Payer: Self-pay | Admitting: Cardiovascular Disease

## 2024-08-13 NOTE — Progress Notes (Signed)
 Remote ICD Transmission

## 2024-08-19 ENCOUNTER — Ambulatory Visit

## 2024-08-19 DIAGNOSIS — I5022 Chronic systolic (congestive) heart failure: Secondary | ICD-10-CM

## 2024-08-19 DIAGNOSIS — Z95810 Presence of automatic (implantable) cardiac defibrillator: Secondary | ICD-10-CM

## 2024-08-20 NOTE — Progress Notes (Signed)
 EPIC Encounter for ICM Monitoring  Patient Name: Brian Bruce is a 79 y.o. male Date: 08/20/2024 Primary Care Physican: Orpha Yancey LABOR, MD Primary Cardiologist: [facility] Cardiology Electrophysiologist: Mealor Bi-V Pacing: 98%            04/14/2023 Weight: 182-183 lbs  08/30/2023 Weight: 178 lb 10/04/2023 Weight: 178 lbs 12/19/2023 Weight: 178-179 lbs 02/21/2024 Weight: 180 lbs 05/01/2024 Weight: 180 lbs 06/05/2024 Office Weight: 182 lbs   AT/AF Burden 0% (taking Warfarin)                                                          Transmission results reviewed.    Since 07/15/2024 ICM Remote Transmission:  CorVue thoracic impedance suggesting normal fluid level   Prescribed: Torsemide  20 mg take 1 tablet daily.     Labs: 06/05/2024 Creatinine 0.81, BUN 17, Potassium 4.5, Sodium 137, GFR 91 12/06/2023 Creatinine 0.84, BUN 11 Potassium 4.4, Sodium 138, GFR 90 A complete set of results can be found in Results Review.   Recommendations:  No changes.   Follow-up plan: ICM clinic phone appointment on 09/19/2024.  91 day device clinic remote transmission 11/07/2024.     EP/Cardiology Office Visits:   Recall 11/25/2024 with Dr. Nancey.     Copy of ICM check sent to Dr. Nancey.     Remote monitoring is medically necessary for Heart Failure Management.    Daily Thoracic Impedance ICM trend: 05/21/2024 through 08/19/2024.    12-14 Month Thoracic Impedance ICM trend:     [name], RN 08/20/2024 10:20 AM

## 2024-08-21 ENCOUNTER — Encounter (HOSPITAL_COMMUNITY)
Admission: RE | Admit: 2024-08-21 | Discharge: 2024-08-21 | Disposition: A | Discharge status: Home / Self Care | Source: Ambulatory Visit | Attending: Ophthalmology | Admitting: Ophthalmology

## 2024-08-29 NOTE — Progress Notes (Signed)
 31 day ICM Remote transmission canceled due to [facility] clinic is on hold until further notice.  91 day remote monitoring will continue per protocol.

## 2024-09-03 ENCOUNTER — Encounter (HOSPITAL_COMMUNITY)
Admission: RE | Admit: 2024-09-03 | Discharge: 2024-09-03 | Disposition: A | Source: Ambulatory Visit | Attending: Ophthalmology

## 2024-09-03 ENCOUNTER — Encounter (HOSPITAL_COMMUNITY): Payer: Self-pay

## 2024-09-04 ENCOUNTER — Encounter: Payer: Self-pay | Admitting: Cardiovascular Disease

## 2024-09-04 ENCOUNTER — Encounter (HOSPITAL_COMMUNITY)

## 2024-09-04 NOTE — Progress Notes (Signed)
 PERIOPERATIVE PRESCRIPTION FOR IMPLANTED CARDIAC DEVICE PROGRAMMING  Patient Information: Name:  Brian Bruce  DOB:  March 25, 1946  MRN:  [id number]  Planned Procedure:  PHACOEMULSIFICATION, CATARACT, WITH IOL INSERTION -INSERTION, STENT, DRUG-ELUTING, LACRIMAL CANALICULUS  Surgeon:  Harrie Agent, MD  Date of Procedure: 09/09/2024 Position during surgery:  supine Cautery will be used.  Device Information:  Clinic EP Physician:  Dr. Eulas Furbish  Device Type:  Pacemaker and Defibrillator Manufacturer and Phone #:  [employer]: [phone] Pacemaker Dependent?:  No. Date of Last Device Check:  08/08/2024 Normal Device Function?:  Yes.    Electrophysiologist's Recommendations:  Have magnet available. Provide continuous ECG monitoring when magnet is used or reprogramming is to be performed.  Procedure may interfere with device function.  Magnet should be placed over device during procedure.  Per Device Clinic Standing Orders, [name], RN  7:21 AM 09/04/2024

## 2024-09-05 NOTE — H&P (Signed)
 Surgical History & Physical  Patient Name: Brian Bruce  DOB: January 24, 1946  Surgery: Cataract extraction with intraocular lens implant phacoemulsification; Right Eye Surgeon: Lynwood Hermann MD Surgery Date: 09/09/2024 Pre-Op Date: 07/15/2024  HPI: A 75 Yr. old male patient 1. The patient is here for a cataract eval- OD. Ref: Dr. Nicholaus. Pt. complains of difficulty when reading, which began 2 months ago. The right eye is affected. The episode is constant. Symptoms occur when the patient is reading. Near vision is worse than distance. The complaint is associated with blurry vision. This is negatively affecting the patient's quality of life and the patient is unable to function adequately in life with the current level of vision. Pt. also has a cyst on OS x 3 months, but has no symptoms with it. HPI Completed by Dr. Lynwood Hermann  Medical History: Cataracts  Diabetes Heart Problem High Blood Pressure  Review of Systems Cardiovascular High Blood Pressure, Irregular Heart beat, Pacemaker/defibrillator Endocrine Hyperthyroidism, Diabetes Social   Never smoked / [id number]  Medication Ocular Systemic Prednisolone-moxiflox-bromfen,  Carvedilol , Torsemide , Glimepiride , Prednisolone acetate, Erythromycin, Amlodipine , Magnesium  oxide, Metformin , Olmesartan, Dofetilide , Atorvastatin , Warfarin  Sx/Procedures None  Drug Allergies  NKDA  History & Physical: Heent: cataracts NECK: supple without bruits LUNGS: lungs clear to auscultation CV: regular rate and rhythm Abdomen: soft and non-tender  Impression & Plan: Assessment: 1.  NUCLEAR SCLEROSIS AGE RELATED; Both Eyes (H25.13) 2.  BLEPHARITIS; Right Upper Lid, Right Lower Lid, Left Upper Lid, Left Lower Lid (H01.001, H01.002,H01.004,H01.005) 3.  DERMATOCHALASIS, no surgery; Right Upper Lid, Left Upper Lid (H02.831, H02.834) 4.  Pinguecula; Both Eyes (H11.153) 5.  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  MACULAR PUCKER; Both Eyes  (H35.373) 7.  ASTIGMATISM, REGULAR; Both Eyes (H52.223)  Plan: 1.  Cataract accounts for the patient's decreased vision. This visual impairment is not correctable with a tolerable change in glasses or contact lenses. Cataract surgery with an implantation of a new lens should significantly improve the visual and functional status of the patient. Recommend phacoemulsification with intraocular lens. Discussed all risks, benefits, alternatives, and potential complications. Discussed the procedures and recovery. The patient desires to have surgery. A-scan/Biometry ordered and will be performed for intraocular lens calculations. The surgery will be performed in order to improve vision for driving, reading, and for eye examinations. Recommend Dextenza  for post-operative pain and inflammation. Educational materials provided: Cataract. History of corneal refractive Surgery: None History of Previous Ocular Surgery (PPV, other): None History of ocular trauma: None Use of Eye Pressure Lowering Drops: None No current contact lens use. Pupil Status: Dilates well - shugarcaine or Lidocaine +Omidira by protocol Multifocal Candidate: No Right Eye. worse - first. Refractive Goal: [place] Recommend Toric Lens. Anatomic narrow angle.  2.  Blepharitis is present - recommend regular lid cleaning.  3.  Asymptomatic, recommend observation for now. Findings, prognosis and treatment options reviewed.  4.  Observe; Artificial tears as needed for irritation.  5.  Hyperopic with likely occludable angles OU. Recommend PCIOL OU.  6.  Asymptomatic. Findings, prognosis and treatment options reviewed. Despite presence of pucker patient would like to observe for now. OCT macula - monitor.  7.  Recommend toric IOL OU.

## 2024-09-09 ENCOUNTER — Ambulatory Visit (HOSPITAL_COMMUNITY): Admission: RE | Admit: 2024-09-09 | Source: Home / Self Care | Admitting: Ophthalmology

## 2024-09-09 ENCOUNTER — Encounter (HOSPITAL_COMMUNITY): Admission: RE | Payer: Self-pay | Source: Home / Self Care

## 2024-09-18 ENCOUNTER — Encounter (HOSPITAL_COMMUNITY)

## 2024-09-19 ENCOUNTER — Ambulatory Visit

## 2024-09-23 ENCOUNTER — Ambulatory Visit (HOSPITAL_COMMUNITY): Admit: 2024-09-23 | Admitting: Ophthalmology

## 2024-09-23 SURGERY — PHACOEMULSIFICATION, CATARACT, WITH IOL INSERTION
Anesthesia: Monitor Anesthesia Care | Laterality: Left

## 2024-11-07 ENCOUNTER — Encounter

## 2025-02-06 ENCOUNTER — Encounter

## 2025-05-08 ENCOUNTER — Encounter

## 2025-08-07 ENCOUNTER — Encounter

## 2025-11-06 ENCOUNTER — Encounter
